# Patient Record
Sex: Male | Born: 1950 | Race: White | Hispanic: No | Marital: Married | Smoking: Never smoker
Health system: Southern US, Community
[De-identification: ages and names within clinical notes are randomized; demographics above are authoritative.]

## PROBLEM LIST (undated history)

## (undated) DIAGNOSIS — R112 Nausea with vomiting, unspecified: Secondary | ICD-10-CM

## (undated) DIAGNOSIS — N529 Male erectile dysfunction, unspecified: Secondary | ICD-10-CM

## (undated) DIAGNOSIS — R0600 Dyspnea, unspecified: Secondary | ICD-10-CM

## (undated) DIAGNOSIS — I2510 Atherosclerotic heart disease of native coronary artery without angina pectoris: Secondary | ICD-10-CM

## (undated) DIAGNOSIS — Z9889 Other specified postprocedural states: Secondary | ICD-10-CM

## (undated) DIAGNOSIS — T4145XA Adverse effect of unspecified anesthetic, initial encounter: Secondary | ICD-10-CM

## (undated) DIAGNOSIS — M1990 Unspecified osteoarthritis, unspecified site: Secondary | ICD-10-CM

## (undated) DIAGNOSIS — E785 Hyperlipidemia, unspecified: Secondary | ICD-10-CM

## (undated) DIAGNOSIS — T8859XA Other complications of anesthesia, initial encounter: Secondary | ICD-10-CM

## (undated) DIAGNOSIS — K219 Gastro-esophageal reflux disease without esophagitis: Secondary | ICD-10-CM

## (undated) DIAGNOSIS — I10 Essential (primary) hypertension: Secondary | ICD-10-CM

## (undated) DIAGNOSIS — C61 Malignant neoplasm of prostate: Secondary | ICD-10-CM

## (undated) DIAGNOSIS — M4802 Spinal stenosis, cervical region: Secondary | ICD-10-CM

## (undated) HISTORY — DX: Essential (primary) hypertension: I10

## (undated) HISTORY — DX: Atherosclerotic heart disease of native coronary artery without angina pectoris: I25.10

## (undated) HISTORY — DX: Malignant neoplasm of prostate: C61

## (undated) HISTORY — PX: CARDIAC CATHETERIZATION: SHX172

## (undated) HISTORY — DX: Male erectile dysfunction, unspecified: N52.9

## (undated) HISTORY — PX: HAND SURGERY: SHX662

## (undated) HISTORY — DX: Hyperlipidemia, unspecified: E78.5

---

## 1983-05-13 HISTORY — PX: APPENDECTOMY: SHX54

## 1999-08-16 ENCOUNTER — Encounter: Admission: RE | Admit: 1999-08-16 | Discharge: 1999-08-16 | Payer: Self-pay | Admitting: Family Medicine

## 1999-08-16 ENCOUNTER — Encounter: Payer: Self-pay | Admitting: Family Medicine

## 2001-03-22 ENCOUNTER — Inpatient Hospital Stay (HOSPITAL_COMMUNITY): Admission: EM | Admit: 2001-03-22 | Discharge: 2001-03-24 | Payer: Self-pay

## 2001-03-22 ENCOUNTER — Encounter: Payer: Self-pay | Admitting: *Deleted

## 2001-03-23 ENCOUNTER — Encounter: Payer: Self-pay | Admitting: Cardiology

## 2001-03-24 ENCOUNTER — Encounter: Payer: Self-pay | Admitting: Gastroenterology

## 2001-07-29 ENCOUNTER — Encounter: Admission: RE | Admit: 2001-07-29 | Discharge: 2001-07-29 | Payer: Self-pay | Admitting: Cardiology

## 2001-07-29 ENCOUNTER — Encounter: Payer: Self-pay | Admitting: Cardiology

## 2002-01-20 ENCOUNTER — Encounter: Payer: Self-pay | Admitting: Family Medicine

## 2002-01-20 ENCOUNTER — Encounter: Admission: RE | Admit: 2002-01-20 | Discharge: 2002-01-20 | Payer: Self-pay | Admitting: Family Medicine

## 2002-04-27 ENCOUNTER — Ambulatory Visit (HOSPITAL_COMMUNITY): Admission: RE | Admit: 2002-04-27 | Discharge: 2002-04-27 | Payer: Self-pay | Admitting: Gastroenterology

## 2002-04-27 ENCOUNTER — Encounter (INDEPENDENT_AMBULATORY_CARE_PROVIDER_SITE_OTHER): Payer: Self-pay | Admitting: *Deleted

## 2002-04-28 ENCOUNTER — Ambulatory Visit (HOSPITAL_COMMUNITY): Admission: RE | Admit: 2002-04-28 | Discharge: 2002-04-28 | Payer: Self-pay | Admitting: Gastroenterology

## 2002-04-28 ENCOUNTER — Encounter: Payer: Self-pay | Admitting: Gastroenterology

## 2002-05-03 ENCOUNTER — Encounter: Payer: Self-pay | Admitting: Gastroenterology

## 2002-05-03 ENCOUNTER — Encounter: Admission: RE | Admit: 2002-05-03 | Discharge: 2002-05-03 | Payer: Self-pay | Admitting: Gastroenterology

## 2002-07-01 ENCOUNTER — Ambulatory Visit (HOSPITAL_COMMUNITY): Admission: RE | Admit: 2002-07-01 | Discharge: 2002-07-01 | Payer: Self-pay | Admitting: General Surgery

## 2005-04-24 ENCOUNTER — Ambulatory Visit (HOSPITAL_COMMUNITY): Admission: RE | Admit: 2005-04-24 | Discharge: 2005-04-24 | Payer: Self-pay | Admitting: Orthopedic Surgery

## 2005-04-24 ENCOUNTER — Ambulatory Visit (HOSPITAL_BASED_OUTPATIENT_CLINIC_OR_DEPARTMENT_OTHER): Admission: RE | Admit: 2005-04-24 | Discharge: 2005-04-24 | Payer: Self-pay | Admitting: Orthopedic Surgery

## 2007-03-13 HISTORY — PX: PROSTATECTOMY: SHX69

## 2007-03-31 ENCOUNTER — Encounter (INDEPENDENT_AMBULATORY_CARE_PROVIDER_SITE_OTHER): Payer: Self-pay | Admitting: Urology

## 2007-03-31 ENCOUNTER — Observation Stay (HOSPITAL_COMMUNITY): Admission: RE | Admit: 2007-03-31 | Discharge: 2007-04-01 | Payer: Self-pay | Admitting: Urology

## 2009-02-02 ENCOUNTER — Encounter: Admission: RE | Admit: 2009-02-02 | Discharge: 2009-02-02 | Payer: Self-pay | Admitting: Occupational Medicine

## 2009-04-11 ENCOUNTER — Inpatient Hospital Stay (HOSPITAL_COMMUNITY): Admission: RE | Admit: 2009-04-11 | Discharge: 2009-04-14 | Payer: Self-pay | Admitting: Orthopedic Surgery

## 2009-04-11 HISTORY — PX: TOTAL KNEE ARTHROPLASTY: SHX125

## 2009-04-11 HISTORY — PX: JOINT REPLACEMENT: SHX530

## 2009-05-07 ENCOUNTER — Encounter: Admission: RE | Admit: 2009-05-07 | Discharge: 2009-05-10 | Payer: Self-pay | Admitting: Orthopedic Surgery

## 2009-05-10 ENCOUNTER — Encounter: Admission: RE | Admit: 2009-05-10 | Discharge: 2009-06-15 | Payer: Self-pay | Admitting: Orthopedic Surgery

## 2010-01-02 ENCOUNTER — Encounter: Admission: RE | Admit: 2010-01-02 | Discharge: 2010-01-02 | Payer: Self-pay | Admitting: Family Medicine

## 2010-01-04 ENCOUNTER — Inpatient Hospital Stay (HOSPITAL_COMMUNITY): Admission: EM | Admit: 2010-01-04 | Discharge: 2010-01-07 | Payer: Self-pay | Admitting: Emergency Medicine

## 2010-07-25 LAB — CARDIAC PANEL(CRET KIN+CKTOT+MB+TROPI)
CK, MB: 1.6 ng/mL (ref 0.3–4.0)
Relative Index: INVALID (ref 0.0–2.5)
Relative Index: INVALID (ref 0.0–2.5)
Total CK: 66 U/L (ref 7–232)
Total CK: 90 U/L (ref 7–232)
Troponin I: 0.01 ng/mL (ref 0.00–0.06)
Troponin I: 0.01 ng/mL (ref 0.00–0.06)

## 2010-07-25 LAB — COMPREHENSIVE METABOLIC PANEL
CO2: 29 mEq/L (ref 19–32)
Calcium: 9 mg/dL (ref 8.4–10.5)
Creatinine, Ser: 1.04 mg/dL (ref 0.4–1.5)
GFR calc non Af Amer: >60 mL/min (ref >60)
Glucose, Bld: 135 mg/dL — ABNORMAL HIGH (ref 70–99)
Total Protein: 5.9 g/dL — ABNORMAL LOW (ref 6.0–8.3)

## 2010-07-25 LAB — CBC
HCT: 40.4 % (ref 39.0–52.0)
HCT: 40.6 % (ref 39.0–52.0)
MCH: 30.2 pg (ref 26.0–34.0)
MCHC: 34.7 g/dL (ref 30.0–36.0)
MCHC: 35.1 g/dL (ref 30.0–36.0)
MCV: 85.8 fL (ref 78.0–100.0)
MCV: 86.4 fL (ref 78.0–100.0)
MCV: 86.8 fL (ref 78.0–100.0)
Platelets: 160 10*3/uL (ref 150–400)
Platelets: 186 10*3/uL (ref 150–400)
Platelets: 197 10*3/uL (ref 150–400)
Platelets: 209 10*3/uL (ref 150–400)
RBC: 4.2 MIL/uL — ABNORMAL LOW (ref 4.22–5.81)
RBC: 4.41 MIL/uL (ref 4.22–5.81)
RBC: 4.73 MIL/uL (ref 4.22–5.81)
RDW: 13.1 % (ref 11.5–15.5)
RDW: 13.2 % (ref 11.5–15.5)
RDW: 13.4 % (ref 11.5–15.5)
WBC: 5.9 10*3/uL (ref 4.0–10.5)
WBC: 6.6 10*3/uL (ref 4.0–10.5)

## 2010-07-25 LAB — HEPATITIS PANEL, ACUTE
HCV Ab: NEGATIVE
Hep A IgM: NEGATIVE
Hepatitis B Surface Ag: NEGATIVE

## 2010-07-25 LAB — POCT CARDIAC MARKERS
CKMB, poc: 1.1 ng/mL (ref 1.0–8.0)
CKMB, poc: 1.9 ng/mL (ref 1.0–8.0)
Myoglobin, poc: 62.5 ng/mL (ref 12–200)
Troponin i, poc: <0.05 ng/mL (ref 0.00–0.09)

## 2010-07-25 LAB — BASIC METABOLIC PANEL
BUN: 10 mg/dL (ref 6–23)
BUN: 18 mg/dL (ref 6–23)
CO2: 29 mEq/L (ref 19–32)
Calcium: 9.2 mg/dL (ref 8.4–10.5)
Chloride: 108 mEq/L (ref 96–112)
Chloride: 99 mEq/L (ref 96–112)
Creatinine, Ser: 1.02 mg/dL (ref 0.4–1.5)
Creatinine, Ser: 1.12 mg/dL (ref 0.4–1.5)
GFR calc Af Amer: >60 mL/min (ref >60)
GFR calc non Af Amer: >60 mL/min (ref >60)
GFR calc non Af Amer: >60 mL/min (ref >60)
GFR calc non Af Amer: >60 mL/min (ref >60)
Glucose, Bld: 104 mg/dL — ABNORMAL HIGH (ref 70–99)
Potassium: 4.4 mEq/L (ref 3.5–5.1)
Potassium: 4.5 mEq/L (ref 3.5–5.1)
Sodium: 136 mEq/L (ref 135–145)
Sodium: 141 mEq/L (ref 135–145)
Sodium: 142 mEq/L (ref 135–145)

## 2010-07-25 LAB — POCT I-STAT, CHEM 8
BUN: 21 mg/dL (ref 6–23)
Chloride: 104 mEq/L (ref 96–112)
Creatinine, Ser: 1.3 mg/dL (ref 0.4–1.5)
Sodium: 143 mEq/L (ref 135–145)
TCO2: 29 mmol/L (ref 0–100)

## 2010-07-25 LAB — CK TOTAL AND CKMB (NOT AT ARMC)
CK, MB: 2.2 ng/mL (ref 0.3–4.0)
Total CK: 95 U/L (ref 7–232)

## 2010-07-25 LAB — MRSA PCR SCREENING

## 2010-07-25 LAB — LIPID PANEL
Cholesterol: 107 mg/dL (ref 0–200)
LDL Cholesterol: 52 mg/dL (ref 0–99)
Total CHOL/HDL Ratio: 3.1 RATIO
VLDL: 20 mg/dL (ref 0–40)

## 2010-07-25 LAB — URINALYSIS, ROUTINE W REFLEX MICROSCOPIC
Bilirubin Urine: NEGATIVE
Hgb urine dipstick: NEGATIVE
Nitrite: NEGATIVE
Protein, ur: NEGATIVE mg/dL
Urobilinogen, UA: 1 mg/dL (ref 0.0–1.0)

## 2010-07-25 LAB — HEPARIN LEVEL (UNFRACTIONATED)

## 2010-07-25 LAB — DIFFERENTIAL
Basophils Absolute: 0 10*3/uL (ref 0.0–0.1)
Eosinophils Relative: 5 % (ref 0–5)
Lymphocytes Relative: 38 % (ref 12–46)
Lymphs Abs: 2.5 10*3/uL (ref 0.7–4.0)
Monocytes Absolute: 0.6 10*3/uL (ref 0.1–1.0)
Neutro Abs: 3.1 10*3/uL (ref 1.7–7.7)

## 2010-07-25 LAB — HEPATIC FUNCTION PANEL
AST: 298 U/L — ABNORMAL HIGH (ref 0–37)
Bilirubin, Direct: 0.2 mg/dL (ref 0.0–0.3)
Indirect Bilirubin: 0.6 mg/dL (ref 0.3–0.9)
Total Bilirubin: 0.8 mg/dL (ref 0.3–1.2)

## 2010-07-25 LAB — LIPASE, BLOOD: Lipase: 46 U/L (ref 11–59)

## 2010-07-25 LAB — HEMOGLOBIN A1C: Mean Plasma Glucose: 120 mg/dL — ABNORMAL HIGH (ref <117)

## 2010-07-25 LAB — PROTIME-INR: INR: 1.02 (ref 0.00–1.49)

## 2010-07-25 LAB — AMYLASE: Amylase: 47 U/L (ref 0–105)

## 2010-08-13 LAB — CBC
HCT: 28.9 % — ABNORMAL LOW (ref 39.0–52.0)
MCHC: 35.4 g/dL (ref 30.0–36.0)
Platelets: 169 10*3/uL (ref 150–400)
Platelets: 192 10*3/uL (ref 150–400)
RBC: 3.79 MIL/uL — ABNORMAL LOW (ref 4.22–5.81)
RDW: 13.2 % (ref 11.5–15.5)
RDW: 13.3 % (ref 11.5–15.5)
WBC: 11.1 10*3/uL — ABNORMAL HIGH (ref 4.0–10.5)

## 2010-08-13 LAB — URINALYSIS, ROUTINE W REFLEX MICROSCOPIC
Leukocytes, UA: NEGATIVE
Protein, ur: 30 mg/dL — AB
Urobilinogen, UA: 1 mg/dL (ref 0.0–1.0)

## 2010-08-13 LAB — PROTIME-INR
INR: 1.17 (ref 0.00–1.49)
INR: 1.37 (ref 0.00–1.49)
Prothrombin Time: 14.8 seconds (ref 11.6–15.2)
Prothrombin Time: 16.8 seconds — ABNORMAL HIGH (ref 11.6–15.2)

## 2010-08-13 LAB — BASIC METABOLIC PANEL
BUN: 11 mg/dL (ref 6–23)
BUN: 12 mg/dL (ref 6–23)
CO2: 29 mEq/L (ref 19–32)
Calcium: 8.6 mg/dL (ref 8.4–10.5)
Creatinine, Ser: 0.89 mg/dL (ref 0.4–1.5)
Creatinine, Ser: 0.96 mg/dL (ref 0.4–1.5)
GFR calc Af Amer: >60 mL/min (ref >60)
GFR calc non Af Amer: >60 mL/min (ref >60)
GFR calc non Af Amer: >60 mL/min (ref >60)
GFR calc non Af Amer: >60 mL/min (ref >60)
Glucose, Bld: 132 mg/dL — ABNORMAL HIGH (ref 70–99)
Glucose, Bld: 181 mg/dL — ABNORMAL HIGH (ref 70–99)
Potassium: 3.5 mEq/L (ref 3.5–5.1)

## 2010-08-13 LAB — URINE MICROSCOPIC-ADD ON

## 2010-08-14 LAB — COMPREHENSIVE METABOLIC PANEL
Albumin: 4.5 g/dL (ref 3.5–5.2)
BUN: 14 mg/dL (ref 6–23)
Calcium: 10.3 mg/dL (ref 8.4–10.5)
Creatinine, Ser: 1.05 mg/dL (ref 0.4–1.5)
Total Protein: 8 g/dL (ref 6.0–8.3)

## 2010-08-14 LAB — URINALYSIS, ROUTINE W REFLEX MICROSCOPIC
Bilirubin Urine: NEGATIVE
Glucose, UA: NEGATIVE mg/dL
Hgb urine dipstick: NEGATIVE
Ketones, ur: NEGATIVE mg/dL
Protein, ur: NEGATIVE mg/dL

## 2010-08-14 LAB — CBC
HCT: 42.6 % (ref 39.0–52.0)
MCHC: 33.3 g/dL (ref 30.0–36.0)
MCV: 90.4 fL (ref 78.0–100.0)
Platelets: 220 10*3/uL (ref 150–400)
RDW: 13.1 % (ref 11.5–15.5)

## 2010-08-14 LAB — APTT: aPTT: 29 seconds (ref 24–37)

## 2010-09-24 NOTE — Op Note (Signed)
NAMESCHON, ZEIDERS NO.:  [id number]   MEDICAL RECORD NO.:  [id number]          PATIENT TYPE:  INP   LOCATION:  1415                         FACILITY:  [facility]   PHYSICIAN:  Valetta Fuller, M.D.  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   POSTOPERATIVE DIAGNOSIS:  Favorable clinical stage T1C adenocarcinoma of  the prostate.   PROCEDURE PERFORMED:  Robotic assisted laparoscopic radical retropubic  prostatectomy.   SURGEON:  Valetta Fuller, M.D.   ASSISTANT:  Dr. Allena Katz.   ANESTHESIA:  General endotracheal.   INDICATIONS:  Charles Oconnor is a relatively healthy 60 year old male who  does have a family history of prostate cancer.  The patient had younger  brother who had been diagnosed with prostate cancer and treated in  [place].  He also had a brother who died of metastatic prostate cancer.  The patient's PSA really was within normal limits but had increased over  the course of time from 2.8-3.8.  An ultrasound was done which showed a  50 grams prostate.  The patient did have a multiple areas with positive  biopsies but this all appeared to be relatively low-volume Gleason 3+3=6  tumor.  The majority of these areas were just some small volumes on  biopsy.  The patient underwent extensive consultation with regard to  treatment options.  He decided to have a surgical approach.  Again he  appeared to understand the potential complications as well as some of  the advantages and disadvantages of surgery for treatment of prostate  cancer in this setting.   TECHNIQUE AND FINDINGS:  The patient did undergo preoperative teaching  and physical therapy consultation.  He received preoperative Unasyn and  also placement of compression boots.  He was brought to the operating  room where he had  successful induction of endotracheal anesthesia.  The  patient was placed in the midlithotomy position and prepped and draped  in the usual manner.  He had been secured to the table.  All extremities  were carefully padded.  He was then placed in steep Trendelenburg  position.  Foley catheter was placed sterilely on the field.  The  initial camera port incision site was chosen to the left the umbilicus  18 cm above the pubic symphysis.  A standard open Hasson technique was  utilized.  A 12 mm trocar was placed after careful palpation revealed no  obvious adhesions.  Abdominal insufflation occurred without difficulty  or complication.  All other trocars were placed with direct visual  guidance.  This included a 12 and 5 mm assist port and three 8 mm  robotic ports.  After all the ports were placed, the robot was docked.  The bladder was filled through the Foley catheter.  The space of Retzius  was entered by dropping the bladder posteriorly.  Hot electrocautery  scissors were used to open the space of Retzius.  Some fat over the  bladder neck and endopelvic fascia of the prostate was taken down and  the superficial vein cauterized with the monopolar TK dissector.  The  endopelvic fascia was then incised from apex to base.  The levator  musculature was swept off the apex of the prostate allowing [place] to  identify and isolate the dorsal venous complex.  The ETS stapling device  was used to staple across the venous complex after taking down the  puboprostatic ligaments bilaterally.  Hemostasis was quite good.  With  the Foley balloon we were able to identify the bladder neck.  The  anterior bladder neck was transected with electrocautery.  Foley  catheter was then found in the midline and retracted anteriorly.  There  was no evidence of a middle lobe.  Posterior bladder neck was then  transected and then posteriorly the individual vas deferens and seminal  vesicles were isolated and individually  dissected.  Clips were used for  the tip of the seminal vesicles.  These structures were then retracted  anteriorly.  We were then able to establish a nice plane between  Denonvilliers fascia on the surface of the prostate and the rectum.  There was no evidence that the prostate was in anyway stuck.   Attention was then turned towards bilateral nerve spare.  On the mid  lateral aspect of the prostate.  Superficial fascia was incised on both  sides.  It was then swept down to the posterior lateral aspect of the  prostate where nerve bundles were dissected free off the prostate from  the apex again to the base.  Once the neurovascular bundles were swept  off the posterior lateral aspect of the prostate on both sides, we were  able to lift the prostate and isolate the pedicles to the prostate which  were taken down with clips.  Once this was accomplished, we focused our  attention on the extreme apex of the prostate.  There was a little  remaining dorsal vein complex which was cauterized.  The urethra was  then sharply incised anteriorly.  The Foley catheter was retracted and  the entire ureter was transected.  A very nice urethral stump remained.  We went ahead and removed the prostate specimen.  There appeared to be  good neurovascular bundles on both sides.  The pelvis was then  irrigated.  We saw no real indication for pelvic lymph node dissection.  O reapproximated some posteriorly Denonvilliers fascia to get the  bladder some additional support utilizing a 2-0 Vicryl suture.  The  posterior urethra and bladder neck were then reapproximated using  another 2-0 Vicryl suture.  The bladder neck did not require any  closure.  Once the posterior aspects were reapproximated, we used a 3-0  double-armed Monocryl suture to perform a 360 degrees running  anastomosis.  A new coude catheter was placed through the anastomosis  without difficulty.  Irrigation revealed this to be watertight.   Through  one of the robotic trocars, a drain was placed and placed in the  retropubic space overlying the bladder and anastomosis.  This was  secured to the skin.  A 12-mm assist port was closed with 0 Vicryl with  the aid of a suture passer.  All other ports were carefully inspected  upon their removal.  An Endopouch was used to obtain the prostatic  specimen.  The camera port incision was extended slightly in order to  remove the specimen.  That incision was closed with a running 0 Vicryl  suture.  Other 8 mm and 5 mm sites were infiltrated with Marcaine and  then closed with clips.  The patient appeared to tolerate the procedure  well.  There were no obvious complications or problems and he was  brought to recovery room in stable condition.           ______________________________  Valetta Fuller, M.D.  Electronically Signed     DSG/MEDQ  D:  04/01/2007  T:  04/01/2007  Job:  [id number]

## 2010-09-27 NOTE — Op Note (Signed)
NAMEJUDE, NACLERIO                         ACCOUNT NO.:  [id number]   MEDICAL RECORD NO.:  [id number]                   PATIENT TYPE:  AMB   LOCATION:  DAY                                  FACILITY:  [facility]   PHYSICIAN:  Adolph Pollack, M.D.            DATE OF BIRTH:  1950/06/29   DATE OF PROCEDURE:  07/01/2002  DATE OF DISCHARGE:                                 OPERATIVE REPORT   PREOPERATIVE DIAGNOSIS:  Chronic right lower quadrant abdominal pain.   POSTOPERATIVE DIAGNOSES:  1. Chronic right lower quadrant abdominal pain.  2. Right lower quadrant adhesions.   PROCEDURE:  Diagnostic laparoscopy with lysis of adhesions.   SURGEON:  Adolph Pollack, M.D.   ANESTHESIA:  General.   INDICATION:  Mr. Gutridge is a 60 year old male, who in the past six months  has had intermittent acute right lower quadrant type.  It sometimes occurs  after a long sitting or bending.  Some nausea occurs with it.  No weight  loss.  No fever or chills.  No dysuria.  He has had an extensive evaluation,  including CT scan, colonoscopy, and small-bowel follow-through with no  obvious pathology noted.  I was asked to see him for consideratio diagnostic  laparoscopy and after discussion of the procedure and the risks, he wanted  to proceed.   TECHNIQUE:  He was seen in the holding area, brought to the operating room,  placed supine on the operating table, and a general anesthetic was  administered.  The abdomen was sterilely prepped and draped.  A previous  appendectomy incision was noted in the right lower quadrant.  Local  anesthetic consisting of 0.5% plain Marcaine was infiltrated in the  subumbilical region and a small subumbilical incision made through the skin,  subcutaneous tissue, and midline fascia.  The peritoneal cavity was entered  sharply and under direct vision.  A pursestring suture of 0 Vicryl was  placed around the fascial edges.  A Hasson trocar was introduced to the  peritoneal cavity, and pneumoperitoneum was created by insufflation of CO2  gas.   Next, a laparoscope was introduced.  Examining in the right lower quadrant  area, I noticed adhesions including the omentum and the cecum and the right  lower quadrant abdominal wall underneath the appendectomy incision.  I  examined the cecum and the proximal right colon and did not notice any  external abnormalities.  I examined the ileocecal valve and proximally the  distal 60 cm of ileum, and no abnormalities were noted.  There was a small  left inguinal hernia noted.  Liver surface appeared normal.   I placed a 5 mm trocar in the left lower quadrant through a left lower  quadrant incision and using sharp dissection, performed adhesiolysis,  allowing the cecum and omentum to fall back into the abdominal cavity.  I  then examined the underside of the appendectomy incision and noticed some  attenuation in the posterior muscular wall, but the anterior muscular wall  appeared to be somewhat intact.  Thus, there was some weakness deep to the  appendectomy incision but no obvious hernia that I could detect at this  time.   Following this, I inspected the area, and no bleeding was noted.  I spoke  with his wife intraoperatively, telling her of the intraoperative findings  and told her I felt that we should stop the procedure at this time and see  how he did clinically, and she was agreeable with this.  He was  hemodynamically stable throughout the entire procedure.   I subsequently removed the 5 mm left lower quadrant trocar and released the  pneumoperitoneum and removed the Hasson trocar.  The subumbilical fascial  defect was closed by tightening up and tying down the pursestring suture.  I  then injected some local anesthetic into the fascia in the subumbilical  region.  The skin incisions were closed with 4-0 Monocryl cuticular  stitches.  Steri-Strips and sterile dressings were applied.   He tolerated  the procedure well without any apparent complications and was  taken to the recovery room in satisfactory condition.  A summary of the  findings include the adhesions between the cecum and the omentum and the  right lower quadrant deep to the appendectomy incision.  A small left  inguinal hernia.  Some attenuation of the abdominal wall just deep to the  appendectomy incision but no obvious hernia that I could appreciate.                                               Adolph Pollack, M.D.    Kari Baars  D:  07/01/2002  T:  07/01/2002  Job:  [id number]   cc:   Anselmo Rod, M.D.  [address]  [address]  [place]  [place] [address]  Fax: [phone]   Donia Guiles, M.D.  [address] [place]  [place] [address]  Fax: [phone]

## 2010-09-27 NOTE — Cardiovascular Report (Signed)
[place]  Patient:    Charles Oconnor, Charles Oconnor Visit Number: [id number] MRN: [id number]          Service Type: MED Location: 3700 [id number] Attending Physician:  Ophelia Shoulder Dictated by:   Madaline Savage, M.D. Proc. Date: 03/22/01 Admit Date:  03/22/2001   CC:         Desma Maxim, M.D.  Cardiac Catheterization Lab   Cardiac Catheterization  PROCEDURES PERFORMED: 1. Selective coronary angiography by Judkins technique. 2. Retrograde left heart catheterization. 3. Left ventricular angiography.  CARDIOLOGIST:  Madaline Savage, M.D.  COMPLICATIONS:  None.  ENTRY SITE:  Right femoral.  DYE USED:  Omnipaque.  PATIENT PROFILE:  The patient is a 60 year old white married man who has been having low sternal chest pain over an area as large as the palm of his hand since last Thursday which would be about four days ago.  It is not constant. It waxes and wanes.  It has gotten some relief with IV nitroglycerin in the emergency room today.  EKGs have been normal.  Cardiac enzymes have been negative.  The patient has a family history of coronary disease.  He has treated hypertension and treated hyperlipidemia.  He was brought to the catheterization lab electively today to further evaluate his coronary arteries in view of his family history and his risk factors.  RESULTS:  PRESSURES:  The left ventricular pressure was 148/17, central aortic pressure was 148/82.  No aortic valve gradient by pullback technique.  ANGIOGRAPHIC RESULTS:  The left main coronary artery was normal.  The LAD contains some luminal irregularities just after the first diagonal branch and after the second septal perforator branch.  These were considered luminal irregularities but no discrete lesions.  The first diagonal branch is a fairly large vessel with three or four distal branches.  No lesions were seen.  The circumflex coronary artery gave rise to  a long small caliber intermediate ramus branch which was normal.  The circumflex contained some luminal irregularities proximally, but no significant lesions were seen.  It terminates as an obtuse marginal branch distally.  The right coronary artery was large and dominant.  There is an area in the proximal portion of the vessel that is 30% narrowed near the takeoff of the pulmonary branch.  The distal vessel is smooth and free of any significant lesions.  This 30% area of narrowing is seen also in the RAO projection as well.  The left ventricle is normal.  Contractility shows no wall motion abnormalities.  I estimate ejection fraction at 55%.  There is no mitral regurgitation.  FINAL DIAGNOSES: 1. Angiographically patent coronary arteries.  (There is a 30% area of smooth    irregularity in the proximal right coronary artery.) 2. Normal left ventricular systolic function.  RECOMMENDATIONS:  GI evaluation.  Continue to work on risk factor modification and optimizing antihypertensive therapy and antilipid therapy. Dictated by:   Madaline Savage, M.D. Attending Physician:  Ophelia Shoulder DD:  03/22/01 TD:  03/23/01 Job: [id number]

## 2010-09-27 NOTE — Consult Note (Signed)
. [facility]  Patient:    DA, AUTHEMENT Visit Number: [id number] MRN: [id number]          Service Type: MED Location: 3700 3715 [id number] Attending Physician:  Ophelia Shoulder Dictated by:   Anselmo Rod, M.D. Proc. Date: 03/22/01 Admit Date:  03/22/2001   CC:         Madaline Savage, M.D.  Desma Maxim, M.D.   Consultation Report  DATE OF BIRTH:  February 06, 1951  REASON FOR CONSULTATION:  Chest pain.  ASSESSMENT: 1. Chest pain with no critical lesion seen on cardiac catheterization.  Rule    out peptic ulcer disease. 2. Nausea.  Rule out gallbladder pathology. 3. Hyperlipidemia on Zocor. 4. Abnormal weight loss of 25 pounds in the last four months. 5. History of hypertension. 6. History of depression and situational anxiety. 7. Family history of premature heart disease.  His brother died of    an myocardial infarction at age 40.  RECOMMENDATIONS: 1. EGD today.  Further recommendations after EGD has been done. 2. Gallbladder ultrasound.  Rule out gallbladder pathology. 3. Decrease aspirin to 81 mg per day. 4. Continue PPI for now.  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  The patient underwent emergent cardiac catheterization that revealed no critical lesions. The patient has been under a lot of emotional stress lately because of his daughter who attempted suicide on three occasions and has had problem with drug and alcohol abuse.  The patient says he lost 25 pounds in the last four months because of no desire to eat.  He is under considerable stress and started Zoloft two weeks ago which he feels has not started helping yet.  He realizes it might be premature to expect results at this time.  He has had chest pain which became severe yesterday with some nausea but without vomiting.  There was no shortness of breath or  diaphoresis.  The patient denies history of ulcers, jaundice, or colitis.  He has a history of hypertension but no other cardiorespiratory complaints.  He denies any genitourinary problems.  The patient had a cardiac catheterization 10 years ago that was normal.  He has had chest discomfort since Thursday of last week. His symptoms got worse yesterday, prompting him to come to the emergency room. There are no musculoskeletal, vascular, or allergic problems.  He denies any headaches, dizziness, or syncope.  The patient has had irregular bowel movements since about two months ago and started experiencing constipation. He has one bowel movement every day but has noticed some hard stools.  He denies any blood or mucus in stool.  ALLERGIES:  No known drug allergies.  MEDICATIONS:  1. In the hospital, he has been on heparin.  2. He is also on aspirin 325 mg per day.  3. Zestril 20 mg per day.  4. HCTZ 12.5 mg per day.  5. Zocor 10 mg per day.  6. Zoloft 50 mg per day.  7. Colace 200 mg p.r.n.  8. Verapamil 240 mg b.i.d.  9. Protonix 40 mg per day. 10. Alprazolam 0.25 mg b.i.d. 11. Restoril 15 mg q.h.s. 12. Nitroglycerin drip which has now been discontinued. 13. Phenergan p.r.n. 14. He also received morphine sulfate for chest pain.  PAST MEDICAL HISTORY:  See list above.  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  He lives [place] his [place] and [place] He has one daughter, 41 years of age, who is a troubled child with problems associated with drug and alcohol use.  The patient denies personal history of drug, alcohol, or tobacco use.  FAMILY HISTORY:  The patients father died of complications associated with AAA at the age of 3.  His mother died of a stroke at 31.  Brother died of an MI at 37.  Another brother died of prostate cancer at 31.  He has a brother who survived prostate cancer.  He has three sisters who  are alive and well. He has no family history of colon cancer.  REVIEW OF SYSTEMS:  Chest pain, anxiety, depression, nausea with no vomiting. No history of bright red blood per rectum or melena.  PHYSICAL EXAMINATION:  GENERAL:  A very pleasant, middle-age, white male in no acute distress, lying comfortably in bed, slightly nauseous.  VITAL SIGNS:  Temperature 97.9, blood pressure 150/90, pulse 80 per minute, respiratory rate 20.  HEENT:  Normocephalic, atraumatic.  PERRLA.  Face symmetric and well preserved.  Oropharyngeal mucosa without exudate.  NECK:  No JVD, thyromegaly, or lymphadenopathy.  CHEST:  Clear to auscultation.  S1, S2 regular.  ABDOMEN:  Soft with minimal epigastric tenderness on palpation with guarding. No rebound or rigidity.  No hepatosplenomegaly, no masses palpable.  RECTAL:   Guaiac negative with no other masses palpable on this examination.  ADMISSION LABORATORY DATA:  Hemoglobin 14.1, hematocrit 40.5, MCV 84.3, white count 6.5, 68% neutrophils.  Sodium 139, potassium 3.8, chloride 106, CO2 28, glucose 101, BUN 14, creatinine 1, calcium 10.  PT 13.3, INR 1, PTT 28.  TSH was done and was normal at 0.891.  CK-MBs were normal.  Liver function tests were normal as well.  IMPRESSION:  The patient had a cardiac catheterization on admission that revealed no critical lesions as mentioned above.   Therefore, considering this, other recommendations have been made as mentioned above.   I will continue to follow the patient in the hospital ______ on outpatient basis thereafter.  Thank you for this consultation. Dictated by:   Anselmo Rod, M.D. Attending Physician:  Ophelia Shoulder DD:  03/23/01 TD:  03/23/01 Job: [id number]

## 2010-09-27 NOTE — Op Note (Signed)
NAMETUAN, TIPPIN               ACCOUNT NO.:  [id number]   MEDICAL RECORD NO.:  [id number]          PATIENT TYPE:  AMB   LOCATION:  DSC                          FACILITY:  [facility]   PHYSICIAN:  Nadara Mustard, MD     DATE OF BIRTH:  December 25, 1950   DATE OF PROCEDURE:  DATE OF DISCHARGE:                                 OPERATIVE REPORT   Audio too short to transcribe (less than 5 seconds)      Nadara Mustard, MD     MVD/MEDQ  D:  04/24/2005  T:  04/24/2005  Job:  [id number]

## 2010-09-27 NOTE — Op Note (Signed)
Charles Oconnor, Charles Oconnor               ACCOUNT NO.:  [id number]   MEDICAL RECORD NO.:  [id number]          PATIENT TYPE:  AMB   LOCATION:  DSC                          FACILITY:  [facility]   PHYSICIAN:  Nadara Mustard, MD     DATE OF BIRTH:  Jul 23, 1950   DATE OF PROCEDURE:  04/24/2005  DATE OF DISCHARGE:                                 OPERATIVE REPORT   PREOPERATIVE DIAGNOSIS:  Medial meniscal tear, right knee.   POSTOPERATIVE DIAGNOSES:  1.  Medial lateral meniscal tears, right knee.  2.  Osteochondral defect, medial femoral condyle.   PROCEDURE:  1.  Abrasion chondroplasty, medial femoral condyle and medial tibial      plateau.  2.  Partial medial and lateral meniscectomies.   SURGEON:  Nadara Mustard, MD   ANESTHESIA:  Knee block plus LMA.   ESTIMATED BLOOD LOSS:  Minimal.   ANTIBIOTICS:  None.   DRAINS:  None.   COMPLICATIONS:  None.   TOURNIQUET TIME:  None.   DISPOSITION:  To PACU in stable condition.   INDICATION FOR PROCEDURE:  The patient is a 60 year old gentleman with  mechanical catching and locking symptoms of his right knee.  The patient has  failed conservative care and presents at this time for arthroscopic  intervention.  The risks and benefits were discussed including infection,  neurovascular injury, persistent pain, and need for additional surgery.  The  patient states he understands and wishes to proceed at this time.   DESCRIPTION OF PROCEDURE:  The patient underwent a knee block and was then  brought to OR room 6.  The patient still had some feeling in the knee and he  underwent an LMA anesthetic.  After adequate level of anesthesia was  obtained, the patient's right lower extremity was prepped using DuraPrep and  draped into a sterile field.  The scope was inserted through the  inferolateral portal and an inferomedial working portal was established.  Visualization showed a large osteochondral defect of the medial femoral  condyle with essentially  eburnated bone.  The ring curette was used to  debride this back to good viable subchondral bone.  The patient had a large  medial meniscal tear; this was also debrided.  There was also grade 3 to  grade 4 changes of the medial tibial plateau and this was also debrided.  Visualization of the notch showed an intact ACL.  Visualization of the  lateral joint line in the figure-of-four position also showed a large  degenerative tear of the lateral meniscus.  This was debrided with the  shaver.  There was no articular cartilage defects in the lateral femoral  condyle, but there were some grade 3 changes of the lateral tibial plateau.  Visualization of the notch showed some grade 3 changes of the patella and  trochlea.  There was a large plica band extending from medially to laterally  across the inferior aspect of  the patella.  This was debrided.  The  suprapatellar pouch was cleansed.  The medial and lateral joint lines were  cleansed of any loose bodies.  A  survey was then again performed of all 3  compartments.  The instruments were removed.  The portals were closed using  3-0 nylon and the wound was injected with a total 20 mL of 0.5% Marcaine  plain and 4 mg of morphine.  The wound was covered with Adaptic, orthopedic  sponges, sterile Webril and a Coban dressing.  The patient was extubated and  taken to the PACU in stable condition, and plan to follow up the office in 2  weeks.      Nadara Mustard, MD  Electronically Signed     MVD/MEDQ  D:  04/24/2005  T:  04/28/2005  Job:  [id number]

## 2010-09-27 NOTE — Op Note (Signed)
NAME:  Charles Oconnor, Charles Oconnor                         ACCOUNT NO.:  [id number]   MEDICAL RECORD NO.:  [id number]                   PATIENT TYPE:  AMB   LOCATION:  ENDO                                 FACILITY:  [facility]   PHYSICIAN:  Anselmo Rod, M.D.               DATE OF BIRTH:  1951/01/09   DATE OF PROCEDURE:  04/27/2002  DATE OF DISCHARGE:                                 OPERATIVE REPORT   PROCEDURE PERFORMED:  Colonoscopy with endoscopies.   ENDOSCOPIST:  Charna Elizabeth, M.D.   INSTRUMENT USED:  Olympus video colonoscope.   INDICATIONS FOR PROCEDURE:  The patient is a 60 year old white male with a  history of rectal bleeding and right lower quadrant pain.  The right lower  quadrant pain has been ongoing issue for several years.  The patient had an  appendectomy 10 years ago.  Rule out colonic polyps, masses, etc.   PREPROCEDURE PREPARATION:  Informed consent was procured from the patient.  The patient was fasted for eight hours prior to the procedure and prepped  with a bottle of magnesium citrate and a gallon of NuLytely the night prior  to the procedure.   PREPROCEDURE PHYSICAL:  The patient had stable vital signs.  Neck supple.  Chest clear to auscultation.  S1 and S2 regular.  Abdomen soft with normal  bowel sounds.  A well healed surgical scar from previous appendectomy.   DESCRIPTION OF PROCEDURE:  The patient was placed in left lateral decubitus  position and sedated with 70 mg of Demerol and 7 mg of Versed intravenously.  Once the patient was adequately sedated and maintained on low flow oxygen  and continuous cardiac monitoring, the Olympus video colonoscope was  advanced from the rectum to the cecum without difficulty.  The patient had  evidence of left-sided diverticulosis, extrinsic compression of the cecum  was noted.  This could be from a mass seen in the distal terminal ileum  which was a soft fleshy mass and was biopsied for pathology.  It did not  seem ulcerated  and the small bowel beyond this area could not be visualized.  The patient had no other masses or polyps seen in the colon.   IMPRESSION:  1. Soft mass in terminal ileum biopsies for pathology.  2. Left-sided diverticulosis.  3. Extrinsic compression of the cecum, question small bowel mass.   RECOMMENDATIONS:  1. Avoid all nonsteroidals including aspirin for the next three to four     weeks.  2. Await pathology results.  3. Repeat CT scan of the abdomen and pelvis after discussing the patient's     CT scan results over the phone with Dr. Stevphen Meuse.  The patient had a CT     scan done in September of 2003 that revealed no acute abnormalities,     inflammatory change in the terminal ileum but this was done without     contrast and  therefore plans are being made to do repeat CT scan with     oral and IV contrast, basic labs including a CBC and a CMET will be     checked today as well.  4. Possible surgical evaluation with Dr. Avel Peace.  The patient has     been discussed with him over the phone.  All the records will be     forwarded to Dr. Abbey Chatters prior to the patient's appointment for Dr.     Maris Berger review.                                                   Anselmo Rod, M.D.    JNM/MEDQ  D:  04/27/2002  T:  04/27/2002  Job:  [id number]   cc:   Donia Guiles, M.D.  [address] [place]  [place] [address]  Fax: [phone]   Adolph Pollack, M.D.  [address], [address]  [place]  [place] [address]  Fax: [phone]

## 2010-09-27 NOTE — Procedures (Signed)
[facility]  Patient:    Charles Oconnor, Charles Oconnor Visit Number: [id number] MRN: [id number]          Service Type: MED Location: [id number] Attending Physician:  Ophelia Shoulder Dictated by:   Anselmo Rod, M.D. Proc. Date: 03/23/01 Admit Date:  03/22/2001   CC:         Madaline Savage, M.D.   Procedure Report  DATE OF BIRTH:  28-May-1950  REFERRING PHYSICIAN:  Madaline Savage, M.D.  PROCEDURE PERFORMED:  Esophagogastroduodenoscopy.  ENDOSCOPIST:  Anselmo Rod, M.D.  INSTRUMENT USED:  Olympus video panendoscope.  INDICATIONS FOR PROCEDURE:  Chest pain and epigastric pain and mild nausea in a 60 year old white male with a normal cardiac catheterization.  Rule out peptic ulcer disease.  PREPROCEDURE PREPARATION:  Informed consent was procured from the patient. The patient was fasted for eight hours prior to the procedure.  PREPROCEDURE PHYSICAL:  The patient had stable vital signs.  Neck supple. Chest clear to auscultation.  S1, S2 regular.  Abdomen soft with minimal epigastric tenderness on palpation with guarding, no rebound, no rigidity, no hepatosplenomegaly.  DESCRIPTION OF PROCEDURE:  The patient was placed in left lateral decubitus position and sedated with 50 mg of Demerol and 7.5 mg of Versed intravenously.  Once the patient was adequately sedated and maintained on low-flow oxygen and continuous cardiac monitoring, the Olympus video panendoscope was advanced through the mouthpiece, over the tongue, into the esophagus under direct vision.  The entire esophagus appeared normal without evidence of ring, stricture, masses, lesions, esophagitis, Barretts mucosa.  The scope was then advanced into the stomach.  There was some mild antral gastritis noted.  There was no evidence of a hiatal hernia.  Mild duodenitis was noted in the duodenum bulb.  The small bowel distal to the bulb appeared normal.  There was no outlet  obstruction.  The patient tolerated the procedure well without complications.  IMPRESSION: 1. Normal-appearing esophagus. 2. Mild antral gastritis. 3. Mild duodenitis in duodenal bulb.  RECOMMENDATION: 1. Continue PPIs. 2. Decrease aspirin to 81 mg per day. 3. Continue Zoloft and Xanax. 4. Abdominal ultrasound rule out gallbladder pathology. Dictated by:   Anselmo Rod, M.D. Attending Physician:  Ophelia Shoulder DD:  03/23/01 TD:  03/23/01 Job: [id number]

## 2010-09-27 NOTE — Discharge Summary (Signed)
[facility]  Patient:    Charles Oconnor, Charles Oconnor Visit Number: [id number] MRN: [id number]          Service Type: MED Location: 3700 [id number] Attending Physician:  Ophelia Shoulder Dictated by:   Abelino Derrick, P.A.-C. Admit Date:  03/22/2001 Disc. Date: 03/24/01   CC:         Desma Maxim, M.D.  Anselmo Rod, M.D.   Discharge Summary  DISCHARGE DIAGNOSES 1. Chest pain, probably noncardiac in origin with trivial irregularities    noted at catheterization this admission. 2. Preserved left ventricular function. 3. Mild gastritis and duodenitis by endoscopy this admission. 4. Hypertension. 5. Hyperlipidemia. 6. History of depression and anxiety.  HISTORY OF PRESENT ILLNESS:  The patient is a 60 year old male, followed by Dr. Madaline Savage and Dr. Desma Maxim, with a history of chest pain. Previous catheterization has been normal.  He had been in his usual state of health when he developed substernal chest pain on March 18, 2001.  This was suspicious for unstable angina.  HOSPITAL COURSE:  He was seen and admitted.  He was actually on a cruise at the time he had this, and he came up from [place]  He was admitted to telemetry and started on heparin and nitroglycerin and Protonix. He was set up for a diagnostic catheterization which was done on March 22, 2001, late in the afternoon.  This revealed no significant lesions, and scattered trivial irregularities with normal LV function.  The patient was seen in consultation by Dr. Anselmo Rod for a GI consultation.  This was on March 23, 2001. Endoscopy was done later on March 23, 2001, which revealed a normal esophagus, mild antral gastritis, and mild duodenitis.  The recommendations were to continue the proton pump inhibitor and cut back on his aspirin dose. The patient did have a gallbladder ultrasound done on March 24, 2001.  If this is negative, he  will be discharged later on November 13th.  We did stop his Verapamil and change him to Norvasc.  He had significant bradycardia, with heart rates into the low 40s.  We feel that he is stable for discharge if his gallbladder ultrasound is negative late on March 24, 2001.  DISCHARGE MEDICATIONS 1. Norvasc 10 mg q.d. 2. Zocor 10 mg q.d. 3. Lisinopril hydrochlorothiazide 20/12.5 mg once q.d. 4. Zoloft 50 mg q.d. 5. Xanax 0.25 mg b.i.d. p.r.n. 6. Aspirin 81 mg q.d. 7. Protonix 40 mg q.d.  LABORATORY DATA:  Renal function shows a sodium of 146, potassium 3.9, BUN 13, creatinine 1.2.  White count 7.5, hemoglobin 12.9, hematocrit 37.4, platelets 216.  TSH 0.89.  INR 1.0.  CPK is negative.  Troponin negative x 1.  Chest x-ray showed no active disease.  He did have a chest CT this admission which revealed no evidence of aortic dissection.  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  A follow-up limited CT without contrast is recommended in six months.  DISPOSITION:  The patient is discharged in stable condition.  FOLLOWUP:  He will follow up in our office on April 09, 2001, at noon.  He will see Dr. Elsie Lincoln next month.  He has been instructed to contact Dr. Arvilla Market for further followup.  As noted above, we will need to make sure that he gets a follow-up limited CT in six months. Dictated by:   Abelino Derrick, P.A.-C.  Attending Physician:  Ophelia Shoulder DD:  03/24/01 TD:  03/24/01 Job: [id number]

## 2011-02-18 LAB — BASIC METABOLIC PANEL
CO2: 29
Calcium: 9.5
GFR calc Af Amer: >60
GFR calc non Af Amer: >60
Sodium: 138

## 2011-02-18 LAB — HEMOGLOBIN AND HEMATOCRIT, BLOOD
HCT: 40.6
Hemoglobin: 11.5 — ABNORMAL LOW
Hemoglobin: 14.4

## 2011-02-18 LAB — TYPE AND SCREEN: ABO/RH(D): O POS

## 2011-02-18 LAB — ABO/RH: ABO/RH(D): O POS

## 2013-01-12 ENCOUNTER — Encounter: Payer: Self-pay | Admitting: *Deleted

## 2013-01-12 ENCOUNTER — Encounter: Payer: Self-pay | Admitting: Internal Medicine

## 2013-01-13 ENCOUNTER — Ambulatory Visit (INDEPENDENT_AMBULATORY_CARE_PROVIDER_SITE_OTHER): Payer: 59 | Admitting: Internal Medicine

## 2013-01-13 ENCOUNTER — Encounter: Payer: Self-pay | Admitting: Internal Medicine

## 2013-01-13 VITALS — BP 142/84 | HR 54 | Ht 74.0 in | Wt 202.9 lb

## 2013-01-13 DIAGNOSIS — I2510 Atherosclerotic heart disease of native coronary artery without angina pectoris: Secondary | ICD-10-CM

## 2013-01-13 DIAGNOSIS — I10 Essential (primary) hypertension: Secondary | ICD-10-CM

## 2013-01-13 DIAGNOSIS — E785 Hyperlipidemia, unspecified: Secondary | ICD-10-CM | POA: Insufficient documentation

## 2013-01-13 NOTE — Progress Notes (Signed)
OFFICE NOTE  Chief Complaint:  Routine office visit  Primary Care Physician: Lupita Raider, MD  HPI:  Charles Oconnor  is a 62 year old gentleman with history of hypertension, dyslipidemia and mild to moderate coronary disease. We have been following him for dyslipidemia and he has had liver enzyme abnormalities before on Zocor and was changed to Pravachol with pretty good control. Sometimes he gets chest discomfort; however, it is pretty short lived and possibly related to that. His EKG today shows a sinus bradycardia at 54 and I understand recently he was placed on a beta blocker which caused marked sinus bradycardia and that was promptly discontinued. At this point, although there is possibly an occasion for that given his history of coronary disease, his heart rate being low, he is unable to tolerate a beta blocker. He is on an ACE inhibitor which is pretty good at controlling his blood pressure as well as amlodipine.   PMHx:  Past Medical History  Diagnosis Date  . Hypertension   . Dyslipidemia   . Coronary artery disease     mild to moderate  . Prostate cancer   . ED (erectile dysfunction)     Past Surgical History  Procedure Laterality Date  . Cardiac catheterization  2002, 2011    mild-mod CAD  . Appendectomy  1985  . Prostatectomy  03/2007  . Total knee arthroplasty  04/2009    FAMHx:  Family History  Problem Relation Age of Onset  . Heart attack Father   . Diabetes Mother   . Heart failure Mother   . Coronary artery disease Mother   . Stroke Mother   . Prostate cancer Brother   . Heart attack Brother     also CVA  . Heart attack Brother     also CVA  . Cancer Brother     SOCHx:   reports that he has never smoked. He has never used smokeless tobacco. His alcohol and drug histories are not on file.  ALLERGIES:  Allergies  Allergen Reactions  . Zocor [Simvastatin]     Elevate liver enzymes    ROS: A comprehensive review of systems was negative  except for: Constitutional: positive for weight gain  HOME MEDS: Current Outpatient Prescriptions  Medication Sig Dispense Refill  . amLODipine (NORVASC) 10 MG tablet Take 10 mg by mouth daily.      . Ascorbic Acid (VITAMIN C PO) Take by mouth daily.      Marland Kitchen aspirin 81 MG tablet Take 81 mg by mouth daily.      . fish oil-omega-3 fatty acids 1000 MG capsule Take 3 g by mouth daily.      Marland Kitchen lisinopril-hydrochlorothiazide (PRINZIDE,ZESTORETIC) 20-12.5 MG per tablet Take 1 tablet by mouth daily.      . Multiple Vitamins-Minerals (MULTIVITAMIN PO) Take by mouth daily.      . Naproxen Sodium (ALEVE PO) Take 2 tablets by mouth daily.      . nitroGLYCERIN (NITROSTAT) 0.4 MG SL tablet Place 0.4 mg under the tongue every 5 (five) minutes as needed for chest pain.      . pantoprazole (PROTONIX) 40 MG tablet Take 40 mg by mouth daily.      . pravastatin (PRAVACHOL) 40 MG tablet Take 40 mg by mouth daily.       No current facility-administered medications for this visit.    LABS/IMAGING: No results found for this or any previous visit (from the past 48 hour(s)). No results found.  VITALS: BP 142/84  Pulse 54  Ht 6\' 2"  (1.88 m)  Wt 202 lb 14.4 oz (92.035 kg)  BMI 26.04 kg/m2  EXAM: General appearance: alert and no distress Neck: no adenopathy, no carotid bruit, no JVD, supple, symmetrical, trachea midline and thyroid not enlarged, symmetric, no tenderness/mass/nodules Lungs: clear to auscultation bilaterally Heart: regular rate and rhythm, S1, S2 normal, no murmur, click, rub or gallop Abdomen: soft, non-tender; bowel sounds normal; no masses,  no organomegaly Extremities: extremities normal, atraumatic, no cyanosis or edema Pulses: 2+ and symmetric Skin: Skin color, texture, turgor normal. No rashes or lesions Neurologic: Grossly normal  EKG: Sinus bradycardia 54  ASSESSMENT: 1. Hypertension-medical 2. Dyslipidemia 3. Mild to moderate coronary disease - medically managed  PLAN: 1.    Mr. Cravens is doing fairly well although he said a small amount of weight gain. I have recommended increased exercise. He reports that his diet is not as good as he is traveling very frequently. Again I've encouraged him to continue to do exercise and will plan to see him back annually. No changes on his medications today.  Chrystie Nose, MD, [facility] Attending Cardiologist [facility]  HILTY,Kenneth C 01/13/2013, 9:21 AM

## 2013-01-13 NOTE — Patient Instructions (Addendum)
Your physician wants you to follow-up in: 1 year. You will receive a reminder letter in the mail two months in advance. If you don't receive a letter, please call our office to schedule the follow-up appointment.  

## 2013-01-21 ENCOUNTER — Ambulatory Visit: Payer: Self-pay | Admitting: Internal Medicine

## 2013-07-06 ENCOUNTER — Other Ambulatory Visit: Payer: Self-pay | Admitting: Dermatology

## 2013-07-20 ENCOUNTER — Other Ambulatory Visit: Payer: Self-pay | Admitting: Family Medicine

## 2013-07-20 ENCOUNTER — Ambulatory Visit
Admission: RE | Admit: 2013-07-20 | Discharge: 2013-07-20 | Disposition: A | Discharge status: Home / Self Care | Payer: 59 | Source: Ambulatory Visit | Attending: Family Medicine | Admitting: Family Medicine

## 2013-07-20 DIAGNOSIS — R221 Localized swelling, mass and lump, neck: Principal | ICD-10-CM

## 2013-07-20 DIAGNOSIS — R220 Localized swelling, mass and lump, head: Secondary | ICD-10-CM

## 2013-07-28 ENCOUNTER — Other Ambulatory Visit: Payer: Self-pay | Admitting: Family Medicine

## 2013-07-28 DIAGNOSIS — R9389 Abnormal findings on diagnostic imaging of other specified body structures: Secondary | ICD-10-CM

## 2013-08-01 ENCOUNTER — Ambulatory Visit
Admission: RE | Admit: 2013-08-01 | Discharge: 2013-08-01 | Disposition: A | Discharge status: Home / Self Care | Payer: 59 | Source: Ambulatory Visit | Attending: Family Medicine | Admitting: Family Medicine

## 2013-08-01 DIAGNOSIS — R9389 Abnormal findings on diagnostic imaging of other specified body structures: Secondary | ICD-10-CM

## 2013-08-01 MED ORDER — GADOBENATE DIMEGLUMINE 529 MG/ML IV SOLN
20.0000 mL | Freq: Once | INTRAVENOUS | Status: AC | PRN
  Administered 2013-08-01: 20 mL via INTRAVENOUS

## 2014-01-24 ENCOUNTER — Encounter: Payer: Self-pay | Admitting: Internal Medicine

## 2014-01-24 ENCOUNTER — Ambulatory Visit (INDEPENDENT_AMBULATORY_CARE_PROVIDER_SITE_OTHER): Payer: 59 | Admitting: Internal Medicine

## 2014-01-24 VITALS — BP 110/76 | HR 55 | Ht 74.0 in | Wt 193.0 lb

## 2014-01-24 DIAGNOSIS — E785 Hyperlipidemia, unspecified: Secondary | ICD-10-CM

## 2014-01-24 DIAGNOSIS — I2510 Atherosclerotic heart disease of native coronary artery without angina pectoris: Secondary | ICD-10-CM

## 2014-01-24 DIAGNOSIS — I10 Essential (primary) hypertension: Secondary | ICD-10-CM

## 2014-01-24 NOTE — Patient Instructions (Signed)
Your physician recommends that you return for lab work within a few weeks - you will need to be fasting.   Your physician wants you to follow-up in: 1 year with Dr. Debara Pickett. You will receive a reminder letter in the mail two months in advance. If you don't receive a letter, please call our office to schedule the follow-up appointment.

## 2014-01-24 NOTE — Progress Notes (Signed)
OFFICE NOTE  Chief Complaint:  Routine office visit  Primary Care Physician: Charles Neer, MD  HPI:  Charles Oconnor  is a 63 year old gentleman with history of hypertension, dyslipidemia and mild to moderate coronary disease. We have been following him for dyslipidemia and he has had liver enzyme abnormalities before on Zocor and was changed to Pravachol with pretty good control. Sometimes he gets chest discomfort; however, it is pretty short lived and possibly related to that. His EKG today shows a sinus bradycardia at 54 and I understand recently he was placed on a beta blocker which caused marked sinus bradycardia and that was promptly discontinued. At this point, although there is possibly an occasion for that given his history of coronary disease, his heart rate being low, he is unable to tolerate a beta blocker. He is on an ACE inhibitor which is pretty good at controlling his blood pressure as well as amlodipine.   Charles Oconnor returns today for followup. He reports doing fairly well. He's recently started doing some walking and has actually lost about 10 pounds in the last 6 months. He says he does feel better and have more energy. A portion is developed some peripheral neuropathy. He's been started on Neurontin by a neurologist in [place]. He seems to be tolerating this medicine although has some occasional lightheadedness and dizziness. He is peripheral nerve pain is better however does have numbness in his feet. He occasionally gets some mild swelling. He also was told that he had high vitamin B6 levels and was told to stop taking his multivitamin.  PMHx:  Past Medical History  Diagnosis Date  . Hypertension   . Dyslipidemia   . Coronary artery disease     mild to moderate  . Prostate cancer   . ED (erectile dysfunction)     Past Surgical History  Procedure Laterality Date  . Cardiac catheterization  2002, 2011    mild-mod CAD  . Appendectomy  1985  .  Prostatectomy  03/2007  . Total knee arthroplasty  04/2009    FAMHx:  Family History  Problem Relation Age of Onset  . Heart attack Father   . Diabetes Mother   . Heart failure Mother   . Coronary artery disease Mother   . Stroke Mother   . Prostate cancer Brother   . Heart attack Brother     also CVA  . Heart attack Brother     also CVA  . Cancer Brother     SOCHx:   reports that he has never smoked. He has never used smokeless tobacco. His alcohol and drug histories are not on file.  ALLERGIES:  Allergies  Allergen Reactions  . Zocor [Simvastatin]     Elevate liver enzymes    ROS: A comprehensive review of systems was negative except for: Neurological: positive for neuropathy  HOME MEDS: Current Outpatient Prescriptions  Medication Sig Dispense Refill  . amLODipine (NORVASC) 10 MG tablet Take 10 mg by mouth daily.      . Ascorbic Acid (VITAMIN C PO) Take by mouth daily.      Marland Kitchen aspirin 81 MG tablet Take 81 mg by mouth daily.      . fish oil-omega-3 fatty acids 1000 MG capsule Take 3 g by mouth daily.      Marland Kitchen gabapentin (NEURONTIN) 300 MG capsule Take 300 mg by mouth 2 (two) times daily.      Marland Kitchen lisinopril-hydrochlorothiazide (PRINZIDE,ZESTORETIC) 20-12.5 MG per tablet Take 2 tablets by mouth daily.       Marland Kitchen  Naproxen Sodium (ALEVE PO) Take 2 tablets by mouth daily.      . nitroGLYCERIN (NITROSTAT) 0.4 MG SL tablet Place 0.4 mg under the tongue every 5 (five) minutes as needed for chest pain.      . pantoprazole (PROTONIX) 40 MG tablet Take 40 mg by mouth daily.      . pravastatin (PRAVACHOL) 40 MG tablet Take 80 mg by mouth daily.        No current facility-administered medications for this visit.    LABS/IMAGING: No results found for this or any previous visit (from the past 48 hour(s)). No results found.  VITALS: BP 110/76  Pulse 55  Ht 6\' 2"  (1.88 m)  Wt 193 lb (87.544 kg)  BMI 24.77 kg/m2  EXAM: General appearance: alert and no distress Neck: no  adenopathy, no carotid bruit, no JVD, supple, symmetrical, trachea midline and thyroid not enlarged, symmetric, no tenderness/mass/nodules Lungs: clear to auscultation bilaterally Heart: regular rate and rhythm, S1, S2 normal, no murmur, click, rub or gallop Abdomen: soft, non-tender; bowel sounds normal; no masses,  no organomegaly Extremities: extremities normal, atraumatic, no cyanosis or edema Pulses: 2+ and symmetric Skin: Skin color, texture, turgor normal. No rashes or lesions Neurologic: Grossly normal  EKG: Sinus bradycardia 55  ASSESSMENT: 1. Hypertension-controlled 2. Dyslipidemia - needs to be assessed 3. Mild to moderate coronary disease - medically managed  PLAN: 1.   Charles Oconnor is doing fairly well.  He has had 10 pound weight loss and continues to do more exercise which I commended him on. Unfortunately is developed some peripheral neuropathy and is now taking gabapentin. His hypertension is well controlled. He's had no chest pain complaints. He is due for a lipid profile which we'll go ahead and obtain today. Plan to see him back annually or sooner as necessary.  Pixie Casino, MD, [facility] Attending Cardiologist [facility] 01/24/2014, 8:53 AM

## 2014-01-31 ENCOUNTER — Encounter: Payer: Self-pay | Admitting: *Deleted

## 2014-01-31 LAB — NMR LIPOPROFILE WITH LIPIDS
Cholesterol, Total: 157 mg/dL (ref 100–199)
HDL Particle Number: 29.5 umol/L — ABNORMAL LOW (ref >=30.5)
HDL SIZE: 8.5 nm — AB (ref >=9.2)
HDL-C: 48 mg/dL (ref >39)
LARGE VLDL-P: 2.2 nmol/L (ref <=2.7)
LDL CALC: 84 mg/dL (ref 0–99)
LDL PARTICLE NUMBER: 1137 nmol/L — AB (ref <1000)
LDL SIZE: 20.4 nm (ref >=20.8)
LP-IR Score: 60 — ABNORMAL HIGH (ref <=45)
Large HDL-P: 3.6 umol/L — ABNORMAL LOW (ref >=4.8)
SMALL LDL PARTICLE NUMBER: 560 nmol/L — AB (ref <=527)
Triglycerides: 125 mg/dL (ref 0–149)
VLDL SIZE: 43 nm (ref <=46.6)

## 2014-02-02 ENCOUNTER — Telehealth: Payer: Self-pay | Admitting: Internal Medicine

## 2014-02-02 NOTE — Telephone Encounter (Signed)
Would like his lab results from 01-27-14 please.

## 2014-02-02 NOTE — Telephone Encounter (Signed)
Spoke to patient. Result given . Verbalized understanding Informed patient that results were mailed ,he will probably receive it next week.

## 2014-08-18 DIAGNOSIS — D2261 Melanocytic nevi of right upper limb, including shoulder: Secondary | ICD-10-CM | POA: Diagnosis not present

## 2014-08-18 DIAGNOSIS — D1801 Hemangioma of skin and subcutaneous tissue: Secondary | ICD-10-CM | POA: Diagnosis not present

## 2014-08-18 DIAGNOSIS — D2272 Melanocytic nevi of left lower limb, including hip: Secondary | ICD-10-CM | POA: Diagnosis not present

## 2014-08-18 DIAGNOSIS — L821 Other seborrheic keratosis: Secondary | ICD-10-CM | POA: Diagnosis not present

## 2014-08-18 DIAGNOSIS — D225 Melanocytic nevi of trunk: Secondary | ICD-10-CM | POA: Diagnosis not present

## 2014-08-18 DIAGNOSIS — L570 Actinic keratosis: Secondary | ICD-10-CM | POA: Diagnosis not present

## 2014-08-18 DIAGNOSIS — Z85828 Personal history of other malignant neoplasm of skin: Secondary | ICD-10-CM | POA: Diagnosis not present

## 2014-08-18 DIAGNOSIS — D692 Other nonthrombocytopenic purpura: Secondary | ICD-10-CM | POA: Diagnosis not present

## 2014-08-18 DIAGNOSIS — L814 Other melanin hyperpigmentation: Secondary | ICD-10-CM | POA: Diagnosis not present

## 2014-08-18 DIAGNOSIS — D2271 Melanocytic nevi of right lower limb, including hip: Secondary | ICD-10-CM | POA: Diagnosis not present

## 2014-09-04 DIAGNOSIS — M545 Low back pain: Secondary | ICD-10-CM | POA: Diagnosis not present

## 2014-09-09 DIAGNOSIS — M545 Low back pain: Secondary | ICD-10-CM | POA: Diagnosis not present

## 2014-09-13 DIAGNOSIS — M47816 Spondylosis without myelopathy or radiculopathy, lumbar region: Secondary | ICD-10-CM | POA: Diagnosis not present

## 2014-09-26 DIAGNOSIS — M47816 Spondylosis without myelopathy or radiculopathy, lumbar region: Secondary | ICD-10-CM | POA: Diagnosis not present

## 2014-10-20 DIAGNOSIS — M5136 Other intervertebral disc degeneration, lumbar region: Secondary | ICD-10-CM | POA: Diagnosis not present

## 2014-10-26 DIAGNOSIS — M5136 Other intervertebral disc degeneration, lumbar region: Secondary | ICD-10-CM | POA: Diagnosis not present

## 2014-11-21 DIAGNOSIS — M5136 Other intervertebral disc degeneration, lumbar region: Secondary | ICD-10-CM | POA: Diagnosis not present

## 2014-12-07 DIAGNOSIS — M5136 Other intervertebral disc degeneration, lumbar region: Secondary | ICD-10-CM | POA: Diagnosis not present

## 2014-12-15 DIAGNOSIS — M25562 Pain in left knee: Secondary | ICD-10-CM | POA: Diagnosis not present

## 2014-12-29 DIAGNOSIS — E782 Mixed hyperlipidemia: Secondary | ICD-10-CM | POA: Diagnosis not present

## 2014-12-29 DIAGNOSIS — I2510 Atherosclerotic heart disease of native coronary artery without angina pectoris: Secondary | ICD-10-CM | POA: Diagnosis not present

## 2014-12-29 DIAGNOSIS — Z1211 Encounter for screening for malignant neoplasm of colon: Secondary | ICD-10-CM | POA: Diagnosis not present

## 2014-12-29 DIAGNOSIS — R5383 Other fatigue: Secondary | ICD-10-CM | POA: Diagnosis not present

## 2014-12-29 DIAGNOSIS — Z23 Encounter for immunization: Secondary | ICD-10-CM | POA: Diagnosis not present

## 2014-12-29 DIAGNOSIS — I119 Hypertensive heart disease without heart failure: Secondary | ICD-10-CM | POA: Diagnosis not present

## 2014-12-29 DIAGNOSIS — G629 Polyneuropathy, unspecified: Secondary | ICD-10-CM | POA: Diagnosis not present

## 2014-12-29 DIAGNOSIS — F411 Generalized anxiety disorder: Secondary | ICD-10-CM | POA: Diagnosis not present

## 2014-12-29 DIAGNOSIS — C61 Malignant neoplasm of prostate: Secondary | ICD-10-CM | POA: Diagnosis not present

## 2014-12-29 DIAGNOSIS — N529 Male erectile dysfunction, unspecified: Secondary | ICD-10-CM | POA: Diagnosis not present

## 2014-12-29 DIAGNOSIS — Z0000 Encounter for general adult medical examination without abnormal findings: Secondary | ICD-10-CM | POA: Diagnosis not present

## 2014-12-29 DIAGNOSIS — M549 Dorsalgia, unspecified: Secondary | ICD-10-CM | POA: Diagnosis not present

## 2015-01-03 DIAGNOSIS — Z1211 Encounter for screening for malignant neoplasm of colon: Secondary | ICD-10-CM | POA: Diagnosis not present

## 2015-01-05 DIAGNOSIS — M5136 Other intervertebral disc degeneration, lumbar region: Secondary | ICD-10-CM | POA: Diagnosis not present

## 2015-02-02 ENCOUNTER — Encounter: Payer: Self-pay | Admitting: Internal Medicine

## 2015-02-02 ENCOUNTER — Encounter: Payer: Self-pay | Admitting: *Deleted

## 2015-02-02 ENCOUNTER — Ambulatory Visit (INDEPENDENT_AMBULATORY_CARE_PROVIDER_SITE_OTHER): Payer: Medicare Other | Admitting: Internal Medicine

## 2015-02-02 VITALS — HR 54 | Ht 73.5 in | Wt 197.0 lb

## 2015-02-02 DIAGNOSIS — I10 Essential (primary) hypertension: Secondary | ICD-10-CM

## 2015-02-02 DIAGNOSIS — I2510 Atherosclerotic heart disease of native coronary artery without angina pectoris: Secondary | ICD-10-CM | POA: Diagnosis not present

## 2015-02-02 DIAGNOSIS — E785 Hyperlipidemia, unspecified: Secondary | ICD-10-CM | POA: Diagnosis not present

## 2015-02-02 MED ORDER — EZETIMIBE 10 MG PO TABS
10.0000 mg | ORAL_TABLET | Freq: Every day | ORAL | Status: DC
Start: 2015-02-02 — End: 2015-02-02

## 2015-02-02 MED ORDER — EZETIMIBE 10 MG PO TABS: 10.0000 mg | ORAL_TABLET | Freq: Every day | ORAL | Status: DC

## 2015-02-02 NOTE — Patient Instructions (Signed)
Your physician wants you to follow-up in: [place] will receive a reminder letter in the mail two months in advance. If you don't receive a letter, please call our office to schedule the follow-up appointment.  Your physician has requested that you have a lexiscan myoview. For further information please visit [email or web address]. Please follow instruction sheet, as given.   START ZETIA 10 MG ONCE DAILY

## 2015-02-02 NOTE — Progress Notes (Signed)
OFFICE NOTE  Chief Complaint:  Chest and back pain  Primary Care Physician: Mayra Neer, MD  HPI:  Charles Oconnor  is a 64 year old gentleman with history of hypertension, dyslipidemia and mild to moderate coronary disease. We have been following him for dyslipidemia and he has had liver enzyme abnormalities before on Zocor and was changed to Pravachol with pretty good control. Sometimes he gets chest discomfort; however, it is pretty short lived and possibly related to that. His EKG today shows a sinus bradycardia at 54 and I understand recently he was placed on a beta blocker which caused marked sinus bradycardia and that was promptly discontinued. At this point, although there is possibly an occasion for that given his history of coronary disease, his heart rate being low, he is unable to tolerate a beta blocker. He is on an ACE inhibitor which is pretty good at controlling his blood pressure as well as amlodipine.   Charles Oconnor returns today for followup. He reports doing fairly well. He's recently started doing some walking and has actually lost about 10 pounds in the last 6 months. He says he does feel better and have more energy. A portion is developed some peripheral neuropathy. He's been started on Neurontin by a neurologist in [place]. He seems to be tolerating this medicine although has some occasional lightheadedness and dizziness. He is peripheral nerve pain is better however does have numbness in his feet. He occasionally gets some mild swelling. He also was told that he had high vitamin B6 levels and was told to stop taking his multivitamin.  I the pleasure see Charles Oconnor back in the office today. Overall he is doing fairly well except he is struggling with low back pain and some neuropathic pain in his legs. He's been getting back injections without much improvement. He's also been describing some occasional chest discomfort and progressive fatigue over the past several  months. He feels like he has no energy. There is also complaints of mid back pain between his shoulder blades which occurs on unusual occasions and not associated with his low back pain. Sometimes it's a little worse when exerting himself although he's not been as active due to his back problems. As a reminder he had heart catheterization in 2011 with Dr. Tina Griffiths, which demonstrated mild to moderate coronary artery disease.  He recently had lab work through his primary care provider which looks fairly normal except for an elevated cholesterol profile. His total cholesterol is 166, HL 43, triglycerides 112 and LDL 101. He's currently on pravastatin 80 mg daily and says that he has had problems with myalgias and liver function abnormalities on simvastatin in the past.  PMHx:  Past Medical History  Diagnosis Date  . Hypertension   . Dyslipidemia   . Coronary artery disease     mild to moderate  . Prostate cancer   . ED (erectile dysfunction)     Past Surgical History  Procedure Laterality Date  . Cardiac catheterization  2002, 2011    mild-mod CAD  . Appendectomy  1985  . Prostatectomy  03/2007  . Total knee arthroplasty  04/2009    FAMHx:  Family History  Problem Relation Age of Onset  . Heart attack Father   . Diabetes Mother   . Heart failure Mother   . Coronary artery disease Mother   . Stroke Mother   . Prostate cancer Brother   . Heart attack Brother     also CVA  . Heart  attack Brother     also CVA  . Cancer Brother     SOCHx:   reports that he has never smoked. He has never used smokeless tobacco. His alcohol and drug histories are not on file.  ALLERGIES:  Allergies  Allergen Reactions  . Zocor [Simvastatin]     Elevate liver enzymes    ROS: A comprehensive review of systems was negative except for: Cardiovascular: positive for chest pressure/discomfort Musculoskeletal: positive for back pain Neurological: positive for neuropathy  HOME MEDS: Current  Outpatient Prescriptions  Medication Sig Dispense Refill  . amLODipine (NORVASC) 10 MG tablet Take 10 mg by mouth daily.    Marland Kitchen aspirin 81 MG tablet Take 81 mg by mouth daily.    Marland Kitchen lisinopril-hydrochlorothiazide (PRINZIDE,ZESTORETIC) 20-12.5 MG per tablet Take 2 tablets by mouth daily.     . pantoprazole (PROTONIX) 40 MG tablet Take 40 mg by mouth daily.    . pravastatin (PRAVACHOL) 80 MG tablet Take 1 tablet by mouth daily.    Marland Kitchen ezetimibe (ZETIA) 10 MG tablet Take 1 tablet (10 mg total) by mouth daily. 30 tablet 11   No current facility-administered medications for this visit.    LABS/IMAGING: No results found for this or any previous visit (from the past 48 hour(s)). No results found.  VITALS: Pulse 54  Ht 6' 1.5" (1.867 m)  Wt 197 lb (89.359 kg)  BMI 25.64 kg/m2  EXAM: General appearance: alert and no distress Neck: no adenopathy, no carotid bruit, no JVD, supple, symmetrical, trachea midline and thyroid not enlarged, symmetric, no tenderness/mass/nodules Lungs: clear to auscultation bilaterally Heart: regular rate and rhythm, S1, S2 normal, no murmur, click, rub or gallop Abdomen: soft, non-tender; bowel sounds normal; no masses,  no organomegaly Extremities: extremities normal, atraumatic, no cyanosis or edema Pulses: 2+ and symmetric Skin: Skin color, texture, turgor normal. No rashes or lesions Neurologic: Grossly normal  EKG: Sinus bradycardia 54  ASSESSMENT: 1. Chest and back pain with progressive fatigue 2. Hypertension-controlled 3. Dyslipidemia - not at goal LDL of <70 4. Mild to moderate coronary disease  PLAN: 1.   Charles Oconnor has been describing chest and back discomfort with some progressive fatigue over the past several months. He says this is fairly unusual for him and has not had complaints like this in the past. Blood pressure appears to be stable although cholesterol is not at goal. He did have mild to moderate coronary disease by cardiac catheterization  in 2011. I would recommend a Lexiscan Myoview to evaluate for possible progressive ischemia. In addition he does need better cholesterol control. Rather than changing statins, I think good option would be adding Zetia 10 mg to his current regimen. We'll provide samples today and a co-pay assistance card. As have repeat lab work in November through his primary care provider and hopefully can have his cholesterol checked at that time. I will contact him with results of his stress test and further management will be based on those findings.  Pixie Casino, MD, [facility] Attending Cardiologist [place] [facility] 02/02/2015, 9:09 AM

## 2015-02-06 ENCOUNTER — Encounter: Payer: Self-pay | Admitting: Internal Medicine

## 2015-02-09 DIAGNOSIS — M5136 Other intervertebral disc degeneration, lumbar region: Secondary | ICD-10-CM | POA: Diagnosis not present

## 2015-02-13 ENCOUNTER — Telehealth (HOSPITAL_COMMUNITY): Payer: Self-pay

## 2015-02-13 DIAGNOSIS — Z23 Encounter for immunization: Secondary | ICD-10-CM | POA: Diagnosis not present

## 2015-02-13 NOTE — Telephone Encounter (Signed)
Encounter complete. 

## 2015-02-13 NOTE — Telephone Encounter (Signed)
Unable to reach pt. Disconnected/no longer in service on all contact numbers.

## 2015-02-16 ENCOUNTER — Ambulatory Visit (HOSPITAL_COMMUNITY)
Admission: RE | Admit: 2015-02-16 | Discharge: 2015-02-16 | Disposition: A | Discharge status: Home / Self Care | Payer: Medicare Other | Source: Ambulatory Visit | Attending: Cardiovascular Disease | Admitting: Cardiovascular Disease

## 2015-02-16 ENCOUNTER — Encounter: Payer: Self-pay | Admitting: Internal Medicine

## 2015-02-16 DIAGNOSIS — R42 Dizziness and giddiness: Secondary | ICD-10-CM | POA: Diagnosis not present

## 2015-02-16 DIAGNOSIS — R5383 Other fatigue: Secondary | ICD-10-CM | POA: Insufficient documentation

## 2015-02-16 DIAGNOSIS — I2510 Atherosclerotic heart disease of native coronary artery without angina pectoris: Secondary | ICD-10-CM | POA: Insufficient documentation

## 2015-02-16 DIAGNOSIS — R079 Chest pain, unspecified: Secondary | ICD-10-CM | POA: Diagnosis not present

## 2015-02-16 DIAGNOSIS — R9439 Abnormal result of other cardiovascular function study: Secondary | ICD-10-CM | POA: Diagnosis not present

## 2015-02-16 DIAGNOSIS — I10 Essential (primary) hypertension: Secondary | ICD-10-CM | POA: Diagnosis not present

## 2015-02-16 DIAGNOSIS — Z8249 Family history of ischemic heart disease and other diseases of the circulatory system: Secondary | ICD-10-CM | POA: Diagnosis not present

## 2015-02-16 LAB — MYOCARDIAL PERFUSION IMAGING
CHL CUP NUCLEAR SSS: 9
CSEPPHR: 77 {beats}/min
LVDIAVOL: 123 mL
LVSYSVOL: 60 mL
NUC STRESS TID: 1.19
Rest HR: 46 {beats}/min
SDS: 4
SRS: 7

## 2015-02-16 MED ORDER — REGADENOSON 0.4 MG/5ML IV SOLN
0.4000 mg | Freq: Once | INTRAVENOUS | Status: AC
  Administered 2015-02-16: 0.4 mg via INTRAVENOUS

## 2015-02-16 MED ORDER — TECHNETIUM TC 99M SESTAMIBI GENERIC - CARDIOLITE
10.5000 | Freq: Once | INTRAVENOUS | Status: AC | PRN
  Administered 2015-02-16: 10.5 via INTRAVENOUS

## 2015-02-16 MED ORDER — TECHNETIUM TC 99M SESTAMIBI GENERIC - CARDIOLITE
31.0000 | Freq: Once | INTRAVENOUS | Status: AC | PRN
  Administered 2015-02-16: 31 via INTRAVENOUS

## 2015-02-16 MED ORDER — AMINOPHYLLINE 25 MG/ML IV SOLN
75.0000 mg | Freq: Once | INTRAVENOUS | Status: AC
  Administered 2015-02-16: 75 mg via INTRAVENOUS

## 2015-02-16 NOTE — Telephone Encounter (Signed)
Pt is returning a call from our office , he says that he was in the office this morning for a stress test and that someone called him back possibly in regards to the test. Please call  Thanks

## 2015-02-16 NOTE — Telephone Encounter (Signed)
This encounter was created in error - please disregard.

## 2015-03-02 ENCOUNTER — Encounter: Payer: Self-pay | Admitting: Internal Medicine

## 2015-03-02 ENCOUNTER — Ambulatory Visit
Admission: RE | Admit: 2015-03-02 | Discharge: 2015-03-02 | Disposition: A | Discharge status: Home / Self Care | Payer: Medicare Other | Source: Ambulatory Visit | Attending: Internal Medicine | Admitting: Internal Medicine

## 2015-03-02 ENCOUNTER — Ambulatory Visit (INDEPENDENT_AMBULATORY_CARE_PROVIDER_SITE_OTHER): Payer: Medicare Other | Admitting: Internal Medicine

## 2015-03-02 VITALS — BP 150/85 | HR 59 | Ht 73.5 in | Wt 196.5 lb

## 2015-03-02 DIAGNOSIS — D689 Coagulation defect, unspecified: Secondary | ICD-10-CM | POA: Diagnosis not present

## 2015-03-02 DIAGNOSIS — I200 Unstable angina: Secondary | ICD-10-CM | POA: Diagnosis not present

## 2015-03-02 DIAGNOSIS — R9439 Abnormal result of other cardiovascular function study: Secondary | ICD-10-CM

## 2015-03-02 DIAGNOSIS — Z01818 Encounter for other preprocedural examination: Secondary | ICD-10-CM | POA: Diagnosis not present

## 2015-03-02 DIAGNOSIS — E785 Hyperlipidemia, unspecified: Secondary | ICD-10-CM

## 2015-03-02 DIAGNOSIS — R5383 Other fatigue: Secondary | ICD-10-CM

## 2015-03-02 DIAGNOSIS — R079 Chest pain, unspecified: Secondary | ICD-10-CM | POA: Diagnosis not present

## 2015-03-02 DIAGNOSIS — I2583 Coronary atherosclerosis due to lipid rich plaque: Secondary | ICD-10-CM

## 2015-03-02 DIAGNOSIS — R931 Abnormal findings on diagnostic imaging of heart and coronary circulation: Secondary | ICD-10-CM | POA: Diagnosis not present

## 2015-03-02 DIAGNOSIS — I10 Essential (primary) hypertension: Secondary | ICD-10-CM

## 2015-03-02 DIAGNOSIS — Z79899 Other long term (current) drug therapy: Secondary | ICD-10-CM

## 2015-03-02 DIAGNOSIS — I2510 Atherosclerotic heart disease of native coronary artery without angina pectoris: Secondary | ICD-10-CM

## 2015-03-02 MED ORDER — NITROGLYCERIN 0.4 MG SL SUBL: 0.4000 mg | SUBLINGUAL_TABLET | SUBLINGUAL | Status: DC | PRN

## 2015-03-02 NOTE — Patient Instructions (Addendum)
Your physician has requested that you have a cardiac catheterization @ [facility] with Dr. Debara Pickett. Cardiac catheterization is used to diagnose and/or treat various heart conditions. Doctors may recommend this procedure for a number of different reasons. The most common reason is to evaluate chest pain. Chest pain can be a symptom of coronary artery disease (CAD), and cardiac catheterization can show whether plaque is narrowing or blocking your heart's arteries. This procedure is also used to evaluate the valves, as well as measure the blood flow and oxygen levels in different parts of your heart. For further information please visit [email or web address]. Please follow instruction sheet, as given.  Following your catheterization, you will not be allowed to drive for 3 days.  No lifting, pushing, or pulling greater that 10 pounds is allowed for 1 week.  You will be required to have the following tests prior to the procedure:  1. Blood work - the blood work can be done no more than 7 days prior to the procedure.  It can be done at any [facility] lab. There is a lab downstairs on the first floor of this building in [address] and one at [address].  2. Chest X-ray - this can be done at [facility] in the [place] @ [address]   Please arrange a cath follow up appointment with Dr. Debara Pickett.   Dr. Debara Pickett has prescribed nitroglycerin to use as needed for chest pain - max 3 doses

## 2015-03-04 ENCOUNTER — Encounter: Payer: Self-pay | Admitting: Internal Medicine

## 2015-03-04 DIAGNOSIS — I200 Unstable angina: Secondary | ICD-10-CM | POA: Insufficient documentation

## 2015-03-04 DIAGNOSIS — R9439 Abnormal result of other cardiovascular function study: Secondary | ICD-10-CM | POA: Insufficient documentation

## 2015-03-04 NOTE — H&P (Signed)
OFFICE NOTE  Chief Complaint:  Follow-up stress test  Primary Care Physician: Mayra Neer, MD  HPI:  Charles Oconnor  is a 64 year old gentleman with history of hypertension, dyslipidemia and mild to moderate coronary disease. We have been following him for dyslipidemia and he has had liver enzyme abnormalities before on Zocor and was changed to Pravachol with pretty good control. Sometimes he gets chest discomfort; however, it is pretty short lived and possibly related to that. His EKG today shows a sinus bradycardia at 54 and I understand recently he was placed on a beta blocker which caused marked sinus bradycardia and that was promptly discontinued. At this point, although there is possibly an occasion for that given his history of coronary disease, his heart rate being low, he is unable to tolerate a beta blocker. He is on an ACE inhibitor which is pretty good at controlling his blood pressure as well as amlodipine.   Charles Oconnor returns today for followup. He reports doing fairly well. He's recently started doing some walking and has actually lost about 10 pounds in the last 6 months. He says he does feel better and have more energy. A portion is developed some peripheral neuropathy. He's been started on Neurontin by a neurologist in [place]. He seems to be tolerating this medicine although has some occasional lightheadedness and dizziness. He is peripheral nerve pain is better however does have numbness in his feet. He occasionally gets some mild swelling. He also was told that he had high vitamin B6 levels and was told to stop taking his multivitamin.  I the pleasure see Charles Oconnor back in the office today. Overall he is doing fairly well except he is struggling with low back pain and some neuropathic pain in his legs. He's been getting back injections without much improvement. He's also been describing some occasional chest discomfort and progressive fatigue over the past  several months. He feels like he has no energy. There is also complaints of mid back pain between his shoulder blades which occurs on unusual occasions and not associated with his low back pain. Sometimes it's a little worse when exerting himself although he's not been as active due to his back problems. As a reminder he had heart catheterization in 2011 with Dr. Tina Griffiths, which demonstrated mild to moderate coronary artery disease.  He recently had lab work through his primary care provider which looks fairly normal except for an elevated cholesterol profile. His total cholesterol is 166, HL 43, triglycerides 112 and LDL 101. He's currently on pravastatin 80 mg daily and says that he has had problems with myalgias and liver function abnormalities on simvastatin in the past.  Charles Oconnor returns today for follow-up of a stress test. The results are as follows:   The left ventricular ejection fraction is mildly decreased (45-54%).  Nuclear stress EF: 52%.  No T wave inversion was noted during stress.  There was no ST segment deviation noted during stress.  Defect 1: There is a medium defect of moderate severity.  There is a moderate-sized and intensity, partially reversible inferoseptal perfusion defect which could represent ischemia. LVEF 52% with basal inferoseptal dyskinesis. This is an intermediate risk study.   I discussed these findings with him at length today in the office and possible management strategies, including additional medical therapy, perhaps adding a long-acting nitrate. A beta blocker is contraindicated due to his bradycardia. The other option would be cardiac catheterization for definitive evaluation of his reversible defect. He is  in favor of cardiac catheterization. I discussed the risks and benefits of this procedure today and he is willing to proceed.  PMHx:  Past Medical History  Diagnosis Date  . Hypertension   . Dyslipidemia   . Coronary artery disease     mild to  moderate  . Prostate cancer   . ED (erectile dysfunction)     Past Surgical History  Procedure Laterality Date  . Cardiac catheterization  2002, 2011    mild-mod CAD  . Appendectomy  1985  . Prostatectomy  03/2007  . Total knee arthroplasty  04/2009    FAMHx:  Family History  Problem Relation Age of Onset  . Heart attack Father   . Diabetes Mother   . Heart failure Mother   . Coronary artery disease Mother   . Stroke Mother   . Prostate cancer Brother   . Heart attack Brother     also CVA  . Heart attack Brother     also CVA  . Cancer Brother     SOCHx:   reports that he has never smoked. He has never used smokeless tobacco. His alcohol and drug histories are not on file.  ALLERGIES:  Allergies  Allergen Reactions  . Zocor [Simvastatin]     Elevate liver enzymes    ROS: A comprehensive review of systems was negative except for: Cardiovascular: positive for chest pressure/discomfort Musculoskeletal: positive for back pain Neurological: positive for neuropathy  HOME MEDS: Current Outpatient Prescriptions  Medication Sig Dispense Refill  . amLODipine (NORVASC) 10 MG tablet Take 10 mg by mouth daily.    Marland Kitchen aspirin 81 MG tablet Take 81 mg by mouth daily.    Marland Kitchen lisinopril-hydrochlorothiazide (PRINZIDE,ZESTORETIC) 20-12.5 MG per tablet Take 2 tablets by mouth daily.     . nitroGLYCERIN (NITROSTAT) 0.4 MG SL tablet Place 1 tablet (0.4 mg total) under the tongue every 5 (five) minutes as needed for chest pain. 25 tablet 3  . pantoprazole (PROTONIX) 40 MG tablet Take 40 mg by mouth daily.    . pravastatin (PRAVACHOL) 80 MG tablet Take 1 tablet by mouth daily.     No current facility-administered medications for this visit.    LABS/IMAGING: No results found for this or any previous visit (from the past 48 hour(s)). Dg Chest 2 View  03/02/2015  CLINICAL DATA:  Chest pain. Coronary artery disease. Prostate carcinoma. Preprocedure exam for cardiac catheterization. EXAM:  CHEST  2 VIEW COMPARISON:  01/04/2010 FINDINGS: The heart size and mediastinal contours are within normal limits. Chronic elevation of right hemidiaphragm is stable. Bibasilar scarring again demonstrated, right side greater than left. No evidence of pulmonary consolidation or edema. No evidence of pleural effusion. IMPRESSION: No active disease. Stable elevation of right hemidiaphragm with right greater than left basilar scarring. Electronically Signed   By: Earle Gell M.D.   On: 03/02/2015 15:12    VITALS: BP 150/85 mmHg  Pulse 59  Ht 6' 1.5" (1.867 m)  Wt 196 lb 8 oz (89.132 kg)  BMI 25.57 kg/m2  EXAM: Deferred  EKG: Deferred  ASSESSMENT:  Chest and back pain with progressive fatigue - intermediate risk nuclear stress test with reversible ischemia  Hypertension-controlled  Dyslipidemia - not at goal LDL of <70  Mild to moderate coronary disease  PLAN: 1.   Charles Oconnor has been describing chest and back discomfort with some progressive fatigue over the past several months. He says this is fairly unusual for him and has not had complaints like this in  the past. Blood pressure appears to be stable although cholesterol is not at goal. He did have mild to moderate coronary disease by cardiac catheterization in 2011. His stress Myoview was abnormal and intermediate risk with reversible ischemia. We discussed cardiac catheterization and he is interested in proceeding that route. We'll plan to schedule his car catheterization within the next couple of weeks. I did discuss the risks and benefits of the procedure with him today and he is willing proceed.  Pixie Casino, MD, [facility] Attending Cardiologist [place] 03/04/2015, 2:03 PM

## 2015-03-07 DIAGNOSIS — D689 Coagulation defect, unspecified: Secondary | ICD-10-CM | POA: Diagnosis not present

## 2015-03-07 DIAGNOSIS — R5383 Other fatigue: Secondary | ICD-10-CM | POA: Diagnosis not present

## 2015-03-07 DIAGNOSIS — Z01818 Encounter for other preprocedural examination: Secondary | ICD-10-CM | POA: Diagnosis not present

## 2015-03-07 DIAGNOSIS — Z79899 Other long term (current) drug therapy: Secondary | ICD-10-CM | POA: Diagnosis not present

## 2015-03-07 LAB — CBC
HCT: 41.1 % (ref 39.0–52.0)
Hemoglobin: 14.1 g/dL (ref 13.0–17.0)
MCH: 29.5 pg (ref 26.0–34.0)
MCHC: 34.3 g/dL (ref 30.0–36.0)
MCV: 86 fL (ref 78.0–100.0)
MPV: 9.9 fL (ref 8.6–12.4)
PLATELETS: 223 10*3/uL (ref 150–400)
RBC: 4.78 MIL/uL (ref 4.22–5.81)
RDW: 12.7 % (ref 11.5–15.5)
WBC: 6.5 10*3/uL (ref 4.0–10.5)

## 2015-03-07 LAB — BASIC METABOLIC PANEL
BUN: 20 mg/dL (ref 7–25)
CHLORIDE: 105 mmol/L (ref 98–110)
CO2: 29 mmol/L (ref 20–31)
CREATININE: 1.05 mg/dL (ref 0.70–1.25)
Calcium: 10.1 mg/dL (ref 8.6–10.3)
Glucose, Bld: 118 mg/dL — ABNORMAL HIGH (ref 65–99)
POTASSIUM: 4.2 mmol/L (ref 3.5–5.3)
Sodium: 141 mmol/L (ref 135–146)

## 2015-03-07 LAB — PROTIME-INR
INR: 1.02 (ref <1.50)
PROTHROMBIN TIME: 13.5 s (ref 11.6–15.2)

## 2015-03-07 LAB — TSH: TSH: 0.72 u[IU]/mL (ref 0.350–4.500)

## 2015-03-07 LAB — APTT: APTT: 31 s (ref 24–37)

## 2015-03-12 ENCOUNTER — Ambulatory Visit (HOSPITAL_COMMUNITY)
Admission: RE | Admit: 2015-03-12 | Discharge: 2015-03-12 | Disposition: A | Discharge status: Home / Self Care | Payer: Medicare Other | Source: Ambulatory Visit | Attending: Internal Medicine | Admitting: Internal Medicine

## 2015-03-12 ENCOUNTER — Encounter (HOSPITAL_COMMUNITY): Payer: Self-pay | Admitting: Internal Medicine

## 2015-03-12 ENCOUNTER — Encounter (HOSPITAL_COMMUNITY)
Admission: RE | Disposition: A | Discharge status: Home / Self Care | Payer: Self-pay | Source: Ambulatory Visit | Attending: Internal Medicine

## 2015-03-12 DIAGNOSIS — I200 Unstable angina: Secondary | ICD-10-CM | POA: Diagnosis present

## 2015-03-12 DIAGNOSIS — I2510 Atherosclerotic heart disease of native coronary artery without angina pectoris: Secondary | ICD-10-CM | POA: Diagnosis not present

## 2015-03-12 DIAGNOSIS — R9439 Abnormal result of other cardiovascular function study: Secondary | ICD-10-CM

## 2015-03-12 DIAGNOSIS — R079 Chest pain, unspecified: Secondary | ICD-10-CM

## 2015-03-12 DIAGNOSIS — E785 Hyperlipidemia, unspecified: Secondary | ICD-10-CM | POA: Diagnosis present

## 2015-03-12 DIAGNOSIS — I10 Essential (primary) hypertension: Secondary | ICD-10-CM | POA: Diagnosis present

## 2015-03-12 HISTORY — PX: CARDIAC CATHETERIZATION: SHX172

## 2015-03-12 SURGERY — LEFT HEART CATH AND CORONARY ANGIOGRAPHY
Anesthesia: LOCAL

## 2015-03-12 MED ORDER — SODIUM CHLORIDE 0.9 % WEIGHT BASED INFUSION: 3.0000 mL/kg/h | INTRAVENOUS | Status: DC

## 2015-03-12 MED ORDER — ONDANSETRON HCL 4 MG/2ML IJ SOLN: 4.0000 mg | Freq: Four times a day (QID) | INTRAMUSCULAR | Status: DC | PRN

## 2015-03-12 MED ORDER — HEPARIN (PORCINE) IN NACL 2-0.9 UNIT/ML-% IJ SOLN
INTRAMUSCULAR | Status: AC
  Filled 2015-03-12: qty 1500

## 2015-03-12 MED ORDER — SODIUM CHLORIDE 0.9 % IJ SOLN: 3.0000 mL | INTRAMUSCULAR | Status: DC | PRN

## 2015-03-12 MED ORDER — SODIUM CHLORIDE 0.9 % IJ SOLN: 3.0000 mL | Freq: Two times a day (BID) | INTRAMUSCULAR | Status: DC

## 2015-03-12 MED ORDER — VERAPAMIL HCL 2.5 MG/ML IV SOLN
INTRA_ARTERIAL | Status: DC | PRN
  Administered 2015-03-12 (×3): 5 mL via INTRA_ARTERIAL

## 2015-03-12 MED ORDER — ACETAMINOPHEN 325 MG PO TABS: 650.0000 mg | ORAL_TABLET | ORAL | Status: DC | PRN

## 2015-03-12 MED ORDER — HEPARIN SODIUM (PORCINE) 1000 UNIT/ML IJ SOLN
INTRAMUSCULAR | Status: AC
  Filled 2015-03-12: qty 1

## 2015-03-12 MED ORDER — FENTANYL CITRATE (PF) 100 MCG/2ML IJ SOLN
INTRAMUSCULAR | Status: AC
  Filled 2015-03-12: qty 4

## 2015-03-12 MED ORDER — LIDOCAINE HCL (PF) 1 % IJ SOLN
INTRAMUSCULAR | Status: DC | PRN
  Administered 2015-03-12: 5 mL

## 2015-03-12 MED ORDER — MIDAZOLAM HCL 2 MG/2ML IJ SOLN
INTRAMUSCULAR | Status: AC
  Filled 2015-03-12: qty 4

## 2015-03-12 MED ORDER — SODIUM CHLORIDE 0.9 % IV SOLN: 250.0000 mL | INTRAVENOUS | Status: DC | PRN

## 2015-03-12 MED ORDER — FENTANYL CITRATE (PF) 100 MCG/2ML IJ SOLN
INTRAMUSCULAR | Status: DC | PRN
  Administered 2015-03-12: 25 ug via INTRAVENOUS

## 2015-03-12 MED ORDER — LIDOCAINE HCL (PF) 1 % IJ SOLN
INTRAMUSCULAR | Status: AC
  Filled 2015-03-12: qty 30

## 2015-03-12 MED ORDER — MIDAZOLAM HCL 2 MG/2ML IJ SOLN
INTRAMUSCULAR | Status: DC | PRN
  Administered 2015-03-12: 1 mg via INTRAVENOUS

## 2015-03-12 MED ORDER — ASPIRIN 81 MG PO CHEW: 81.0000 mg | CHEWABLE_TABLET | ORAL | Status: DC

## 2015-03-12 MED ORDER — IOHEXOL 350 MG/ML SOLN
INTRAVENOUS | Status: DC | PRN
  Administered 2015-03-12: 80 mL via INTRA_ARTERIAL

## 2015-03-12 MED ORDER — SODIUM CHLORIDE 0.9 % IV SOLN
INTRAVENOUS | Status: DC
  Administered 2015-03-12: 07:00:00 via INTRAVENOUS

## 2015-03-12 MED ORDER — HEPARIN SODIUM (PORCINE) 1000 UNIT/ML IJ SOLN
INTRAMUSCULAR | Status: DC | PRN
  Administered 2015-03-12: 4500 [IU] via INTRAVENOUS

## 2015-03-12 MED ORDER — VERAPAMIL HCL 2.5 MG/ML IV SOLN
INTRAVENOUS | Status: AC
  Filled 2015-03-12: qty 2

## 2015-03-12 SURGICAL SUPPLY — 11 items
CATH INFINITI 5FR ANG PIGTAIL (CATHETERS) ×2 IMPLANT
CATH INFINITI JR4 5F (CATHETERS) ×2 IMPLANT
CATH OPTITORQUE TIG 4.0 5F (CATHETERS) ×2 IMPLANT
DEVICE RAD COMP TR BAND LRG (VASCULAR PRODUCTS) ×2 IMPLANT
GLIDESHEATH SLEND SS 6F .021 (SHEATH) ×2 IMPLANT
KIT HEART LEFT (KITS) ×2 IMPLANT
PACK CARDIAC CATHETERIZATION (CUSTOM PROCEDURE TRAY) ×2 IMPLANT
SYR MEDRAD MARK V 150ML (SYRINGE) ×2 IMPLANT
TRANSDUCER W/STOPCOCK (MISCELLANEOUS) ×2 IMPLANT
TUBING CIL FLEX 10 FLL-RA (TUBING) ×2 IMPLANT
WIRE SAFE-T 1.5MM-J .035X260CM (WIRE) ×2 IMPLANT

## 2015-03-12 NOTE — H&P (Signed)
     INTERVAL PROCEDURE H&P  History and Physical Interval Note:  03/12/2015 7:42 AM  Charles Oconnor has presented today for their planned procedure. The various methods of treatment have been discussed with the patient and family. After consideration of risks, benefits and other options for treatment, the patient has consented to the procedure.  The patients' outpatient history has been reviewed, patient examined, and no change in status from most recent office note within the past 30 days. I have reviewed the patients' chart and labs and will proceed as planned. Questions were answered to the patient's satisfaction.    Cath Lab Visit (complete for each Cath Lab visit)  Clinical Evaluation Leading to the Procedure:   ACS: No.  Non-ACS:    Anginal Classification: CCS II  Anti-ischemic medical therapy: Maximal Therapy (2 or more classes of medications)  Non-Invasive Test Results: Intermediate-risk stress test findings: cardiac mortality 1-3%/year  Prior CABG: No previous CABG   Pixie Casino, MD, [facility] Attending Cardiologist [place] 03/12/2015, 7:42 AM

## 2015-03-12 NOTE — Discharge Instructions (Signed)
Radial Site Care °Refer to this sheet in the next few weeks. These instructions provide you with information about caring for yourself after your procedure. Your health care provider may also give you more specific instructions. Your treatment has been planned according to current medical practices, but problems sometimes occur. Call your health care provider if you have any problems or questions after your procedure. °WHAT TO EXPECT AFTER THE PROCEDURE °After your procedure, it is typical to have the following: °· Bruising at the radial site that usually fades within 1-2 weeks. °· Blood collecting in the tissue (hematoma) that may be painful to the touch. It should usually decrease in size and tenderness within 1-2 weeks. °HOME CARE INSTRUCTIONS °· Take medicines only as directed by your health care provider. °· You may shower 24-48 hours after the procedure or as directed by your health care provider. Remove the bandage (dressing) and gently wash the site with plain soap and water. Pat the area dry with a clean towel. Do not rub the site, because this may cause bleeding. °· Do not take baths, swim, or use a hot tub until your health care provider approves. °· Check your insertion site every day for redness, swelling, or drainage. °· Do not apply powder or lotion to the site. °· Do not flex or bend the affected arm for 24 hours or as directed by your health care provider. °· Do not push or pull heavy objects with the affected arm for 24 hours or as directed by your health care provider. °· Do not lift over 10 lb (4.5 kg) for 5 days after your procedure or as directed by your health care provider. °· Ask your health care provider when it is okay to: °¨ Return to work or school. °¨ Resume usual physical activities or sports. °¨ Resume sexual activity. °· Do not drive home if you are discharged the same day as the procedure. Have someone else drive you. °· You may drive 24 hours after the procedure unless otherwise  instructed by your health care provider. °· Do not operate machinery or power tools for 24 hours after the procedure. °· If your procedure was done as an outpatient procedure, which means that you went home the same day as your procedure, a responsible adult should be with you for the first 24 hours after you arrive home. °· Keep all follow-up visits as directed by your health care provider. This is important. °SEEK MEDICAL CARE IF: °· You have a fever. °· You have chills. °· You have increased bleeding from the radial site. Hold pressure on the site. °SEEK IMMEDIATE MEDICAL CARE IF: °· You have unusual pain at the radial site. °· You have redness, warmth, or swelling at the radial site. °· You have drainage (other than a small amount of blood on the dressing) from the radial site. °· The radial site is bleeding, and the bleeding does not stop after 30 minutes of holding steady pressure on the site. °· Your arm or hand becomes pale, cool, tingly, or numb. °  °This information is not intended to replace advice given to you by your health care provider. Make sure you discuss any questions you have with your health care provider. °  °Document Released: 05/31/2010 Document Revised: 05/19/2014 Document Reviewed: 11/14/2013 °Elsevier Interactive Patient Education ©2016 [employer] ° °

## 2015-03-30 ENCOUNTER — Encounter: Payer: Self-pay | Admitting: Internal Medicine

## 2015-03-30 ENCOUNTER — Ambulatory Visit (INDEPENDENT_AMBULATORY_CARE_PROVIDER_SITE_OTHER): Payer: Medicare Other | Admitting: Internal Medicine

## 2015-03-30 VITALS — BP 116/74 | HR 56 | Ht 73.0 in | Wt 199.3 lb

## 2015-03-30 DIAGNOSIS — I10 Essential (primary) hypertension: Secondary | ICD-10-CM

## 2015-03-30 DIAGNOSIS — I2583 Coronary atherosclerosis due to lipid rich plaque: Secondary | ICD-10-CM

## 2015-03-30 DIAGNOSIS — I2510 Atherosclerotic heart disease of native coronary artery without angina pectoris: Secondary | ICD-10-CM

## 2015-03-30 DIAGNOSIS — E785 Hyperlipidemia, unspecified: Secondary | ICD-10-CM

## 2015-03-30 DIAGNOSIS — I200 Unstable angina: Secondary | ICD-10-CM | POA: Diagnosis not present

## 2015-03-30 NOTE — Patient Instructions (Signed)
Your physician wants you to follow-up in: 1 year with Dr. Hilty. You will receive a reminder letter in the mail two months in advance. If you don't receive a letter, please call our office to schedule the follow-up appointment.  

## 2015-03-30 NOTE — Progress Notes (Signed)
OFFICE NOTE  Chief Complaint:  Follow-up stress test  Primary Care Physician: Mayra Neer, MD  HPI:  Charles Oconnor  is a 64 year old gentleman with history of hypertension, dyslipidemia and mild to moderate coronary disease. We have been following him for dyslipidemia and he has had liver enzyme abnormalities before on Zocor and was changed to Pravachol with pretty good control. Sometimes he gets chest discomfort; however, it is pretty short lived and possibly related to that. His EKG today shows a sinus bradycardia at 54 and I understand recently he was placed on a beta blocker which caused marked sinus bradycardia and that was promptly discontinued. At this point, although there is possibly an occasion for that given his history of coronary disease, his heart rate being low, he is unable to tolerate a beta blocker. He is on an ACE inhibitor which is pretty good at controlling his blood pressure as well as amlodipine.   Charles Oconnor returns today for followup. He reports doing fairly well. He's recently started doing some walking and has actually lost about 10 pounds in the last 6 months. He says he does feel better and have more energy. A portion is developed some peripheral neuropathy. He's been started on Neurontin by a neurologist in [place]. He seems to be tolerating this medicine although has some occasional lightheadedness and dizziness. He is peripheral nerve pain is better however does have numbness in his feet. He occasionally gets some mild swelling. He also was told that he had high vitamin B6 levels and was told to stop taking his multivitamin.  I the pleasure see Charles Oconnor back in the office today. Overall he is doing fairly well except he is struggling with low back pain and some neuropathic pain in his legs. He's been getting back injections without much improvement. He's also been describing some occasional chest discomfort and progressive fatigue over the past  several months. He feels like he has no energy. There is also complaints of mid back pain between his shoulder blades which occurs on unusual occasions and not associated with his low back pain. Sometimes it's a little worse when exerting himself although he's not been as active due to his back problems. As a reminder he had heart catheterization in 2011 with Dr. Tina Griffiths, which demonstrated mild to moderate coronary artery disease.  He recently had lab work through his primary care provider which looks fairly normal except for an elevated cholesterol profile. His total cholesterol is 166, HL 43, triglycerides 112 and LDL 101. He's currently on pravastatin 80 mg daily and says that he has had problems with myalgias and liver function abnormalities on simvastatin in the past.  Charles Oconnor returns today for follow-up. He recently underwent a stress test which showed the following:   The left ventricular ejection fraction is mildly decreased (45-54%).  Nuclear stress EF: 52%.  No T wave inversion was noted during stress.  There was no ST segment deviation noted during stress.  Defect 1: There is a medium defect of moderate severity.  There is a moderate-sized and intensity, partially reversible inferoseptal perfusion defect which could represent ischemia. LVEF 52% with basal inferoseptal dyskinesis. This is an intermediate risk study.   Based on these findings, I recommended either continued medical therapy or we could consider cardiac catheterization. Subsequently he felt like his chest pain was worsening and ultimately based on his prior catheterization results, we decided to proceed with cardiac catheterization. The results are as follows:   Prox RCA  lesion, 40% stenosed. The lesion was not previously treated.  Prox Cx to Dist Cx lesion, 20% stenosed. The lesion was not previously treated.  Ost 1st Diag to 1st Diag lesion, 40% stenosed. The lesion was not previously treated.  Dist LAD  lesion, 20% stenosed. The lesion was not previously treated.  The left ventricular systolic function is normal.  Mild, non-obstructive CAD - somewhat improved from prior cath in 2011. Normal LV function. False positive nuclear stress test. At this point, Charles Oconnor is doing much better and denies any chest pain or worsening shortness of breath. He gets a small amount of ankle edema which I attribute to amlodipine.  PMHx:  Past Medical History  Diagnosis Date  . Hypertension   . Dyslipidemia   . Coronary artery disease     mild to moderate  . Prostate cancer ([place])   . ED (erectile dysfunction)     Past Surgical History  Procedure Laterality Date  . Cardiac catheterization  2002, 2011    mild-mod CAD  . Appendectomy  1985  . Prostatectomy  03/2007  . Total knee arthroplasty  04/2009  . Cardiac catheterization N/A 03/12/2015    Procedure: Left Heart Cath and Coronary Angiography;  Surgeon: Pixie Casino, MD;  Location: [place] [facility];  Service: Cardiovascular;  Laterality: N/A;    FAMHx:  Family History  Problem Relation Age of Onset  . Heart attack Father   . Diabetes Mother   . Heart failure Mother   . Coronary artery disease Mother   . Stroke Mother   . Prostate cancer Brother   . Heart attack Brother     also CVA  . Heart attack Brother     also CVA  . Cancer Brother     SOCHx:   reports that he has never smoked. He has never used smokeless tobacco. His alcohol and drug histories are not on file.  ALLERGIES:  Allergies  Allergen Reactions  . Zocor [Simvastatin] Other (See Comments)    Elevate liver enzymes    ROS: A comprehensive review of systems was negative except for: Cardiovascular: positive for lower extremity edema  HOME MEDS: Current Outpatient Prescriptions  Medication Sig Dispense Refill  . amLODipine (NORVASC) 10 MG tablet Take 10 mg by mouth daily.    Marland Kitchen aspirin 81 MG tablet Take 81 mg by mouth daily.    Marland Kitchen  lisinopril-hydrochlorothiazide (PRINZIDE,ZESTORETIC) 20-12.5 MG per tablet Take 2 tablets by mouth daily.     . Multiple Vitamins-Minerals (CENTRUM SILVER PO) Take 1 tablet by mouth daily.    . naproxen sodium (ANAPROX) 220 MG tablet Take 440 mg by mouth as needed (for pain).    . nitroGLYCERIN (NITROSTAT) 0.4 MG SL tablet Place 1 tablet (0.4 mg total) under the tongue every 5 (five) minutes as needed for chest pain. 25 tablet 3  . pantoprazole (PROTONIX) 40 MG tablet Take 40 mg by mouth daily.    . pravastatin (PRAVACHOL) 80 MG tablet Take 80 mg by mouth daily.      No current facility-administered medications for this visit.    LABS/IMAGING: No results found for this or any previous visit (from the past 48 hour(s)). No results found.  VITALS: BP 116/74 mmHg  Pulse 56  Ht 6\' 1"  (1.854 m)  Wt 199 lb 4.8 oz (90.402 kg)  BMI 26.30 kg/m2  EXAM: Extremities: Radial calf site is intact without ecchymosis, bruit or hematoma  EKG: Deferred  ASSESSMENT:  Chest and back pain with  progressive fatigue - mild nonobstructive coronary disease by cath (2016), somewhat improved compared to his prior study in 2011  Hypertension-controlled  Dyslipidemia - not at goal LDL of <70  Mild to moderate coronary disease  PLAN: 1.   Charles Oconnor has had resolution of his chest pain symptoms and a [facility] catheterization which was similar not mildly improved compared with study in 2011. He needs to continue to work on aggressive cholesterol and blood pressure control as well as diet and exercise. Overall this should be pretty reassuring. Plan to see him back annually or sooner as necessary.  Pixie Casino, MD, [facility] Attending Cardiologist [place] 03/30/2015, 8:28 AM

## 2015-04-03 DIAGNOSIS — E782 Mixed hyperlipidemia: Secondary | ICD-10-CM | POA: Diagnosis not present

## 2015-05-11 DIAGNOSIS — Z8546 Personal history of malignant neoplasm of prostate: Secondary | ICD-10-CM | POA: Diagnosis not present

## 2015-07-29 ENCOUNTER — Encounter (HOSPITAL_COMMUNITY): Payer: Self-pay | Admitting: Emergency Medicine

## 2015-07-29 ENCOUNTER — Emergency Department (HOSPITAL_COMMUNITY)
Admission: EM | Admit: 2015-07-29 | Discharge: 2015-07-30 | Disposition: A | Discharge status: Home / Self Care | Payer: Medicare Other | Attending: Emergency Medicine | Admitting: Emergency Medicine

## 2015-07-29 DIAGNOSIS — E785 Hyperlipidemia, unspecified: Secondary | ICD-10-CM | POA: Diagnosis not present

## 2015-07-29 DIAGNOSIS — Z87438 Personal history of other diseases of male genital organs: Secondary | ICD-10-CM | POA: Diagnosis not present

## 2015-07-29 DIAGNOSIS — Z7982 Long term (current) use of aspirin: Secondary | ICD-10-CM | POA: Insufficient documentation

## 2015-07-29 DIAGNOSIS — R197 Diarrhea, unspecified: Secondary | ICD-10-CM | POA: Diagnosis not present

## 2015-07-29 DIAGNOSIS — R109 Unspecified abdominal pain: Secondary | ICD-10-CM | POA: Insufficient documentation

## 2015-07-29 DIAGNOSIS — I10 Essential (primary) hypertension: Secondary | ICD-10-CM | POA: Diagnosis not present

## 2015-07-29 DIAGNOSIS — Z8546 Personal history of malignant neoplasm of prostate: Secondary | ICD-10-CM | POA: Diagnosis not present

## 2015-07-29 DIAGNOSIS — R112 Nausea with vomiting, unspecified: Secondary | ICD-10-CM | POA: Insufficient documentation

## 2015-07-29 DIAGNOSIS — I2510 Atherosclerotic heart disease of native coronary artery without angina pectoris: Secondary | ICD-10-CM | POA: Insufficient documentation

## 2015-07-29 DIAGNOSIS — Z79899 Other long term (current) drug therapy: Secondary | ICD-10-CM | POA: Diagnosis not present

## 2015-07-29 DIAGNOSIS — Z9889 Other specified postprocedural states: Secondary | ICD-10-CM | POA: Diagnosis not present

## 2015-07-29 DIAGNOSIS — R000 Tachycardia, unspecified: Secondary | ICD-10-CM | POA: Insufficient documentation

## 2015-07-29 LAB — URINALYSIS, ROUTINE W REFLEX MICROSCOPIC
GLUCOSE, UA: NEGATIVE mg/dL
KETONES UR: NEGATIVE mg/dL
NITRITE: NEGATIVE
PH: 5.5 (ref 5.0–8.0)
Protein, ur: 30 mg/dL — AB
Specific Gravity, Urine: 1.03 (ref 1.005–1.030)

## 2015-07-29 LAB — URINE MICROSCOPIC-ADD ON: SQUAMOUS EPITHELIAL / LPF: NONE SEEN

## 2015-07-29 LAB — COMPREHENSIVE METABOLIC PANEL
ALBUMIN: 5.3 g/dL — AB (ref 3.5–5.0)
ALK PHOS: 73 U/L (ref 38–126)
ALT: 23 U/L (ref 17–63)
AST: 26 U/L (ref 15–41)
Anion gap: 13 (ref 5–15)
BILIRUBIN TOTAL: 1.7 mg/dL — AB (ref 0.3–1.2)
BUN: 26 mg/dL — AB (ref 6–20)
CO2: 24 mmol/L (ref 22–32)
Calcium: 10.5 mg/dL — ABNORMAL HIGH (ref 8.9–10.3)
Chloride: 102 mmol/L (ref 101–111)
Creatinine, Ser: 1.3 mg/dL — ABNORMAL HIGH (ref 0.61–1.24)
GFR calc Af Amer: >60 mL/min (ref >60)
GFR calc non Af Amer: 56 mL/min — ABNORMAL LOW (ref >60)
GLUCOSE: 158 mg/dL — AB (ref 65–99)
POTASSIUM: 3.9 mmol/L (ref 3.5–5.1)
Sodium: 139 mmol/L (ref 135–145)
TOTAL PROTEIN: 8.7 g/dL — AB (ref 6.5–8.1)

## 2015-07-29 LAB — I-STAT CG4 LACTIC ACID, ED: Lactic Acid, Venous: 1.56 mmol/L (ref 0.5–2.0)

## 2015-07-29 LAB — CBC
HEMATOCRIT: 46.2 % (ref 39.0–52.0)
Hemoglobin: 16.2 g/dL (ref 13.0–17.0)
MCH: 29.8 pg (ref 26.0–34.0)
MCHC: 35.1 g/dL (ref 30.0–36.0)
MCV: 84.9 fL (ref 78.0–100.0)
Platelets: 233 10*3/uL (ref 150–400)
RBC: 5.44 MIL/uL (ref 4.22–5.81)
RDW: 13.4 % (ref 11.5–15.5)
WBC: 13.2 10*3/uL — ABNORMAL HIGH (ref 4.0–10.5)

## 2015-07-29 LAB — LIPASE, BLOOD: Lipase: 30 U/L (ref 11–51)

## 2015-07-29 MED ORDER — ONDANSETRON HCL 4 MG/2ML IJ SOLN
4.0000 mg | Freq: Once | INTRAMUSCULAR | Status: AC | PRN
  Administered 2015-07-29: 4 mg via INTRAVENOUS
  Filled 2015-07-29: qty 2

## 2015-07-29 MED ORDER — SODIUM CHLORIDE 0.9 % IV BOLUS (SEPSIS)
1000.0000 mL | Freq: Once | INTRAVENOUS | Status: AC
  Administered 2015-07-29: 1000 mL via INTRAVENOUS

## 2015-07-29 NOTE — ED Provider Notes (Signed)
CSN: [id number]     Arrival date & time 07/29/15  1842 History   First MD Initiated Contact with Patient 07/29/15 2023     Chief Complaint  Patient presents with  . Diarrhea  . Emesis     (Consider location/radiation/quality/duration/timing/severity/associated sxs/prior Treatment) HPI Comments: Patient presents to the emergency department with chief complaint of diarrhea. He states that he has had diarrhea since 4 AM this morning. He has had too many episodes to count. He reports to associated episodes of vomiting. Denies seeing any blood in his vomit or stool. He states that it has been watery. He denies any fevers or chills. He states that he ate some undercooked chicken last night. He denies taking anything for his symptoms. There are no modifying factors. He reports some associated crampy abdominal pain.  The history is provided by the patient. No language interpreter was used.    Past Medical History  Diagnosis Date  . Hypertension   . Dyslipidemia   . Coronary artery disease     mild to moderate  . Prostate cancer ([place])   . ED (erectile dysfunction)    Past Surgical History  Procedure Laterality Date  . Cardiac catheterization  2002, 2011    mild-mod CAD  . Appendectomy  1985  . Prostatectomy  03/2007  . Total knee arthroplasty  04/2009  . Cardiac catheterization N/A 03/12/2015    Procedure: Left Heart Cath and Coronary Angiography;  Surgeon: Pixie Casino, MD;  Location: [place] [facility];  Service: Cardiovascular;  Laterality: N/A;  . Hand surgery Left     had joints fused   Family History  Problem Relation Age of Onset  . Heart attack Father   . Diabetes Mother   . Heart failure Mother   . Coronary artery disease Mother   . Stroke Mother   . Prostate cancer Brother   . Heart attack Brother     also CVA  . Heart attack Brother     also CVA  . Cancer Brother    Social History  Substance Use Topics  . Smoking status: Never Smoker   . Smokeless tobacco:  Never Used  . Alcohol Use: No    Review of Systems  Constitutional: Negative for fever and chills.  Respiratory: Negative for shortness of breath.   Cardiovascular: Negative for chest pain.  Gastrointestinal: Positive for vomiting and diarrhea. Negative for nausea and constipation.  Genitourinary: Negative for dysuria.  All other systems reviewed and are negative.     Allergies  Zocor  Home Medications   Prior to Admission medications   Medication Sig Start Date End Date Taking? Authorizing Provider  amLODipine (NORVASC) 10 MG tablet Take 10 mg by mouth daily.   Yes Historical Provider, MD  aspirin 81 MG tablet Take 81 mg by mouth daily.   Yes Historical Provider, MD  lisinopril-hydrochlorothiazide (PRINZIDE,ZESTORETIC) 20-12.5 MG per tablet Take 2 tablets by mouth daily.    Yes Historical Provider, MD  Multiple Vitamins-Minerals (CENTRUM SILVER PO) Take 1 tablet by mouth daily.   Yes Historical Provider, MD  naproxen sodium (ANAPROX) 220 MG tablet Take 440 mg by mouth as needed (for pain).   Yes Historical Provider, MD  nitroGLYCERIN (NITROSTAT) 0.4 MG SL tablet Place 1 tablet (0.4 mg total) under the tongue every 5 (five) minutes as needed for chest pain. 03/02/15  Yes Pixie Casino, MD  pantoprazole (PROTONIX) 40 MG tablet Take 40 mg by mouth daily.   Yes Historical Provider, MD  pravastatin (PRAVACHOL) 80 MG tablet Take 80 mg by mouth daily.  12/22/14  Yes Historical Provider, MD   BP 113/97 mmHg  Pulse 107  Temp(Src) 99.1 F (37.3 C) (Oral)  Resp 20  SpO2 97% Physical Exam  Constitutional: He is oriented to person, place, and time. He appears well-developed and well-nourished.  HENT:  Head: Normocephalic and atraumatic.  Eyes: Conjunctivae and EOM are normal. Pupils are equal, round, and reactive to light. Right eye exhibits no discharge. Left eye exhibits no discharge. No scleral icterus.  Neck: Normal range of motion. Neck supple. No JVD present.  Cardiovascular:  Regular rhythm and normal heart sounds.  Exam reveals no gallop and no friction rub.   No murmur heard. Mildly tachycardic  Pulmonary/Chest: Effort normal and breath sounds normal. No respiratory distress. He has no wheezes. He has no rales. He exhibits no tenderness.  Abdominal: Soft. He exhibits no distension and no mass. There is no tenderness. There is no rebound and no guarding.  No focal abdominal tenderness, no RLQ tenderness or pain at McBurney's point, no RUQ tenderness or Murphy's sign, no left-sided abdominal tenderness, no fluid wave, or signs of peritonitis   Musculoskeletal: Normal range of motion. He exhibits no edema or tenderness.  Neurological: He is alert and oriented to person, place, and time.  Skin: Skin is warm and dry.  Psychiatric: He has a normal mood and affect. His behavior is normal. Judgment and thought content normal.  Nursing note and vitals reviewed.   ED Course  Procedures (including critical care time) Results for orders placed or performed during the hospital encounter of 07/29/15  Lipase, blood  Result Value Ref Range   Lipase 30 11 - 51 U/L  Comprehensive metabolic panel  Result Value Ref Range   Sodium 139 135 - 145 mmol/L   Potassium 3.9 3.5 - 5.1 mmol/L   Chloride 102 101 - 111 mmol/L   CO2 24 22 - 32 mmol/L   Glucose, Bld 158 (H) 65 - 99 mg/dL   BUN 26 (H) 6 - 20 mg/dL   Creatinine, Ser 1.30 (H) 0.61 - 1.24 mg/dL   Calcium 10.5 (H) 8.9 - 10.3 mg/dL   Total Protein 8.7 (H) 6.5 - 8.1 g/dL   Albumin 5.3 (H) 3.5 - 5.0 g/dL   AST 26 15 - 41 U/L   ALT 23 17 - 63 U/L   Alkaline Phosphatase 73 38 - 126 U/L   Total Bilirubin 1.7 (H) 0.3 - 1.2 mg/dL   GFR calc non Af Amer 56 (L) >60 mL/min   GFR calc Af Amer >60 >60 mL/min   Anion gap 13 5 - 15  CBC  Result Value Ref Range   WBC 13.2 (H) 4.0 - 10.5 K/uL   RBC 5.44 4.22 - 5.81 MIL/uL   Hemoglobin 16.2 13.0 - 17.0 g/dL   HCT 46.2 39.0 - 52.0 %   MCV 84.9 78.0 - 100.0 fL   MCH 29.8 26.0 -  34.0 pg   MCHC 35.1 30.0 - 36.0 g/dL   RDW 13.4 11.5 - 15.5 %   Platelets 233 150 - 400 K/uL  Urinalysis, Routine w reflex microscopic (not at [facility])  Result Value Ref Range   Color, Urine ORANGE (A) YELLOW   APPearance CLOUDY (A) CLEAR   Specific Gravity, Urine 1.030 1.005 - 1.030   pH 5.5 5.0 - 8.0   Glucose, UA NEGATIVE NEGATIVE mg/dL   Hgb urine dipstick TRACE (A) NEGATIVE   Bilirubin Urine SMALL (  A) NEGATIVE   Ketones, ur NEGATIVE NEGATIVE mg/dL   Protein, ur 30 (A) NEGATIVE mg/dL   Nitrite NEGATIVE NEGATIVE   Leukocytes, UA TRACE (A) NEGATIVE  Urine microscopic-add on  Result Value Ref Range   Squamous Epithelial / LPF NONE SEEN NONE SEEN   WBC, UA 0-5 0 - 5 WBC/hpf   RBC / HPF 0-5 0 - 5 RBC/hpf   Bacteria, UA RARE (A) NONE SEEN   Casts HYALINE CASTS (A) NEGATIVE   Urine-Other MUCOUS PRESENT   I-Stat CG4 Lactic Acid, ED  Result Value Ref Range   Lactic Acid, Venous 1.56 0.5 - 2.0 mmol/L   No results found.  I have personally reviewed and evaluated these images and lab results as part of my medical decision-making.    MDM   Final diagnoses:  Non-intractable vomiting with nausea, vomiting of unspecified type  Diarrhea, unspecified type    Patient with diarrhea that started this morning. Will check labs, will give fluids, will reassess.  Mild bump in creatinine to 1.3, electrolytes are normal, CBC shows mild leukocytosis, nonfocal. Abdominal exam is nontender. Doubt acute abdomen or surgical process. Patient is feeling well. Discussed with Dr. Roderic Palau, who recommends discharge. Strict return precautions given. Patient understands and agrees with plan. He is stable and ready for discharge.  Patient instructed to return for:  New or worsening symptoms, including, increased abdominal pain, especially pain that localizes to one side, bloody vomit, bloody diarrhea, fever >101, and intractable vomiting.  Filed Vitals:   07/29/15 2301 07/30/15 0019  BP: 125/74 132/73   Pulse: 90 71  Temp:  99 F (37.2 C)  Resp: 16 [address], [place] 07/30/15 0029  Milton Ferguson, MD 08/01/15 2013

## 2015-07-29 NOTE — ED Notes (Signed)
Patient reports eating undercooked chicken last night. Emesis x2, diarrhea too many to count. Patient is tachycardic in triage.

## 2015-07-30 MED ORDER — ONDANSETRON HCL 4 MG PO TABS: 4.0000 mg | ORAL_TABLET | Freq: Four times a day (QID) | ORAL | Status: DC

## 2015-07-30 NOTE — Discharge Instructions (Signed)

## 2015-08-03 DIAGNOSIS — M79644 Pain in right finger(s): Secondary | ICD-10-CM | POA: Diagnosis not present

## 2015-08-03 DIAGNOSIS — M1811 Unilateral primary osteoarthritis of first carpometacarpal joint, right hand: Secondary | ICD-10-CM | POA: Diagnosis not present

## 2015-08-17 DIAGNOSIS — D692 Other nonthrombocytopenic purpura: Secondary | ICD-10-CM | POA: Diagnosis not present

## 2015-08-17 DIAGNOSIS — D2272 Melanocytic nevi of left lower limb, including hip: Secondary | ICD-10-CM | POA: Diagnosis not present

## 2015-08-17 DIAGNOSIS — L814 Other melanin hyperpigmentation: Secondary | ICD-10-CM | POA: Diagnosis not present

## 2015-08-17 DIAGNOSIS — D1801 Hemangioma of skin and subcutaneous tissue: Secondary | ICD-10-CM | POA: Diagnosis not present

## 2015-08-17 DIAGNOSIS — D2271 Melanocytic nevi of right lower limb, including hip: Secondary | ICD-10-CM | POA: Diagnosis not present

## 2015-08-17 DIAGNOSIS — D225 Melanocytic nevi of trunk: Secondary | ICD-10-CM | POA: Diagnosis not present

## 2015-08-17 DIAGNOSIS — Z85828 Personal history of other malignant neoplasm of skin: Secondary | ICD-10-CM | POA: Diagnosis not present

## 2015-08-17 DIAGNOSIS — L821 Other seborrheic keratosis: Secondary | ICD-10-CM | POA: Diagnosis not present

## 2016-01-18 DIAGNOSIS — Z23 Encounter for immunization: Secondary | ICD-10-CM | POA: Diagnosis not present

## 2016-03-12 DIAGNOSIS — E782 Mixed hyperlipidemia: Secondary | ICD-10-CM | POA: Diagnosis not present

## 2016-03-12 DIAGNOSIS — R5383 Other fatigue: Secondary | ICD-10-CM | POA: Diagnosis not present

## 2016-03-12 DIAGNOSIS — N529 Male erectile dysfunction, unspecified: Secondary | ICD-10-CM | POA: Diagnosis not present

## 2016-03-12 DIAGNOSIS — C61 Malignant neoplasm of prostate: Secondary | ICD-10-CM | POA: Diagnosis not present

## 2016-03-12 DIAGNOSIS — Z23 Encounter for immunization: Secondary | ICD-10-CM | POA: Diagnosis not present

## 2016-03-12 DIAGNOSIS — Z1211 Encounter for screening for malignant neoplasm of colon: Secondary | ICD-10-CM | POA: Diagnosis not present

## 2016-03-12 DIAGNOSIS — F411 Generalized anxiety disorder: Secondary | ICD-10-CM | POA: Diagnosis not present

## 2016-03-12 DIAGNOSIS — I119 Hypertensive heart disease without heart failure: Secondary | ICD-10-CM | POA: Diagnosis not present

## 2016-03-12 DIAGNOSIS — I2510 Atherosclerotic heart disease of native coronary artery without angina pectoris: Secondary | ICD-10-CM | POA: Diagnosis not present

## 2016-03-12 DIAGNOSIS — Z0000 Encounter for general adult medical examination without abnormal findings: Secondary | ICD-10-CM | POA: Diagnosis not present

## 2016-03-12 DIAGNOSIS — G629 Polyneuropathy, unspecified: Secondary | ICD-10-CM | POA: Diagnosis not present

## 2016-03-12 DIAGNOSIS — M549 Dorsalgia, unspecified: Secondary | ICD-10-CM | POA: Diagnosis not present

## 2016-03-20 DIAGNOSIS — Z1211 Encounter for screening for malignant neoplasm of colon: Secondary | ICD-10-CM | POA: Diagnosis not present

## 2016-03-28 DIAGNOSIS — M545 Low back pain: Secondary | ICD-10-CM | POA: Diagnosis not present

## 2016-04-01 ENCOUNTER — Encounter: Payer: Self-pay | Admitting: Internal Medicine

## 2016-04-01 ENCOUNTER — Ambulatory Visit (INDEPENDENT_AMBULATORY_CARE_PROVIDER_SITE_OTHER): Payer: Medicare Other | Admitting: Internal Medicine

## 2016-04-01 VITALS — BP 130/68 | HR 67 | Ht 73.0 in | Wt 196.0 lb

## 2016-04-01 DIAGNOSIS — M545 Low back pain: Secondary | ICD-10-CM | POA: Diagnosis not present

## 2016-04-01 DIAGNOSIS — I2510 Atherosclerotic heart disease of native coronary artery without angina pectoris: Secondary | ICD-10-CM

## 2016-04-01 DIAGNOSIS — E785 Hyperlipidemia, unspecified: Secondary | ICD-10-CM

## 2016-04-01 DIAGNOSIS — I10 Essential (primary) hypertension: Secondary | ICD-10-CM

## 2016-04-01 DIAGNOSIS — J069 Acute upper respiratory infection, unspecified: Secondary | ICD-10-CM | POA: Diagnosis not present

## 2016-04-01 DIAGNOSIS — I2583 Coronary atherosclerosis due to lipid rich plaque: Secondary | ICD-10-CM | POA: Diagnosis not present

## 2016-04-01 DIAGNOSIS — B9789 Other viral agents as the cause of diseases classified elsewhere: Secondary | ICD-10-CM

## 2016-04-01 NOTE — Patient Instructions (Signed)
Medication Instructions:  You can take Delsym or Robitussin for your cough and Mucinex for congestion all of these meds are over the counter  Labwork: None   Testing/Procedures: None   Follow-Up: Your physician wants you to follow-up in: 12 months with DR HILTY. You will receive a reminder letter in the mail two months in advance. If you don't receive a letter, please call our office to schedule the follow-up appointment.  Any Other Special Instructions Will Be Listed Below (If Applicable).     If you need a refill on your cardiac medications before your next appointment, please call your pharmacy.

## 2016-04-01 NOTE — Progress Notes (Signed)
OFFICE NOTE  Chief Complaint:  Cough, sore throat  Primary Care Physician: Mayra Neer, MD  HPI:  Charles Oconnor  is a 65 year old gentleman with history of hypertension, dyslipidemia and mild to moderate coronary disease. We have been following him for dyslipidemia and he has had liver enzyme abnormalities before on Zocor and was changed to Pravachol with pretty good control. Sometimes he gets chest discomfort; however, it is pretty short lived and possibly related to that. His EKG today shows a sinus bradycardia at 54 and I understand recently he was placed on a beta blocker which caused marked sinus bradycardia and that was promptly discontinued. At this point, although there is possibly an occasion for that given his history of coronary disease, his heart rate being low, he is unable to tolerate a beta blocker. He is on an ACE inhibitor which is pretty good at controlling his blood pressure as well as amlodipine.   Charles Oconnor returns today for followup. He reports doing fairly well. He's recently started doing some walking and has actually lost about 10 pounds in the last 6 months. He says he does feel better and have more energy. A portion is developed some peripheral neuropathy. He's been started on Neurontin by a neurologist in [place]. He seems to be tolerating this medicine although has some occasional lightheadedness and dizziness. He is peripheral nerve pain is better however does have numbness in his feet. He occasionally gets some mild swelling. He also was told that he had high vitamin B6 levels and was told to stop taking his multivitamin.  I the pleasure see Charles Oconnor back in the office today. Overall he is doing fairly well except he is struggling with low back pain and some neuropathic pain in his legs. He's been getting back injections without much improvement. He's also been describing some occasional chest discomfort and progressive fatigue over the past several  months. He feels like he has no energy. There is also complaints of mid back pain between his shoulder blades which occurs on unusual occasions and not associated with his low back pain. Sometimes it's a little worse when exerting himself although he's not been as active due to his back problems. As a reminder he had heart catheterization in 2011 with Dr. Tina Griffiths, which demonstrated mild to moderate coronary artery disease.  He recently had lab work through his primary care provider which looks fairly normal except for an elevated cholesterol profile. His total cholesterol is 166, HL 43, triglycerides 112 and LDL 101. He's currently on pravastatin 80 mg daily and says that he has had problems with myalgias and liver function abnormalities on simvastatin in the past.  Charles Oconnor returns today for follow-up. He recently underwent a stress test which showed the following:   The left ventricular ejection fraction is mildly decreased (45-54%).  Nuclear stress EF: 52%.  No T wave inversion was noted during stress.  There was no ST segment deviation noted during stress.  Defect 1: There is a medium defect of moderate severity.  There is a moderate-sized and intensity, partially reversible inferoseptal perfusion defect which could represent ischemia. LVEF 52% with basal inferoseptal dyskinesis. This is an intermediate risk study.   Based on these findings, I recommended either continued medical therapy or we could consider cardiac catheterization. Subsequently he felt like his chest pain was worsening and ultimately based on his prior catheterization results, we decided to proceed with cardiac catheterization. The results are as follows:   Prox RCA  lesion, 40% stenosed. The lesion was not previously treated.  Prox Cx to Dist Cx lesion, 20% stenosed. The lesion was not previously treated.  Ost 1st Diag to 1st Diag lesion, 40% stenosed. The lesion was not previously treated.  Dist LAD lesion, 20%  stenosed. The lesion was not previously treated.  The left ventricular systolic function is normal.  Mild, non-obstructive CAD - somewhat improved from prior cath in 2011. Normal LV function. False positive nuclear stress test. At this point, Charles Oconnor is doing much better and denies any chest pain or worsening shortness of breath. He gets a small amount of ankle edema which I attribute to amlodipine.  04/01/2016  Charles Oconnor returns today for follow-up. He denies any chest pain or worsening shortness of breath. He apparently had a sore throat yesterday and has some drainage today. He is interested in some over-the-counter medications for that. He's had no chest pain that sounds anginal. Blood pressure well controlled. Recent cholesterol check through his primary care provider shows good control with LDL less than 100.  PMHx:  Past Medical History:  Diagnosis Date  . Coronary artery disease    mild to moderate  . Dyslipidemia   . ED (erectile dysfunction)   . Hypertension   . Prostate cancer [facility])     Past Surgical History:  Procedure Laterality Date  . APPENDECTOMY  1985  . CARDIAC CATHETERIZATION  2002, 2011   mild-mod CAD  . CARDIAC CATHETERIZATION N/A 03/12/2015   Procedure: Left Heart Cath and Coronary Angiography;  Surgeon: Pixie Casino, MD;  Location: [place] [facility];  Service: Cardiovascular;  Laterality: N/A;  . HAND SURGERY Left    had joints fused  . PROSTATECTOMY  03/2007  . TOTAL KNEE ARTHROPLASTY  04/2009    FAMHx:  Family History  Problem Relation Age of Onset  . Heart attack Father   . Diabetes Mother   . Heart failure Mother   . Coronary artery disease Mother   . Stroke Mother   . Prostate cancer Brother   . Heart attack Brother     also CVA  . Heart attack Brother     also CVA  . Cancer Brother     SOCHx:   reports that he has never smoked. He has never used smokeless tobacco. He reports that he does not drink alcohol or use  drugs.  ALLERGIES:  Allergies  Allergen Reactions  . Zocor [Simvastatin] Other (See Comments)    Elevate liver enzymes    ROS: A comprehensive review of systems was negative except for: Cardiovascular: positive for lower extremity edema  HOME MEDS: Current Outpatient Prescriptions  Medication Sig Dispense Refill  . amLODipine (NORVASC) 10 MG tablet Take 10 mg by mouth daily.    Marland Kitchen aspirin 81 MG tablet Take 81 mg by mouth daily.    Marland Kitchen lisinopril-hydrochlorothiazide (PRINZIDE,ZESTORETIC) 20-12.5 MG per tablet Take 2 tablets by mouth daily.     . Multiple Vitamins-Minerals (CENTRUM SILVER PO) Take 1 tablet by mouth daily.    . naproxen sodium (ANAPROX) 220 MG tablet Take 440 mg by mouth as needed (for pain).    . nitroGLYCERIN (NITROSTAT) 0.4 MG SL tablet Place 1 tablet (0.4 mg total) under the tongue every 5 (five) minutes as needed for chest pain. 25 tablet 3  . pantoprazole (PROTONIX) 40 MG tablet Take 40 mg by mouth daily.    . pravastatin (PRAVACHOL) 80 MG tablet Take 80 mg by mouth daily.  No current facility-administered medications for this visit.     LABS/IMAGING: No results found for this or any previous visit (from the past 48 hour(s)). No results found.  VITALS: BP 130/68   Pulse 67   Ht 6\' 1"  (1.854 m)   Wt 196 lb (88.9 kg)   BMI 25.86 kg/m   EXAM: General appearance: alert and no distress Lungs: clear to auscultation bilaterally Heart: regular rate and rhythm, S1, S2 normal, no murmur, click, rub or gallop Extremities: extremities normal, atraumatic, no cyanosis or edema Skin: Skin color, texture, turgor normal. No rashes or lesions Neurologic: Grossly normal  EKG: Normal sinus rhythm, non-specific T wave changes, rate 67  ASSESSMENT: 1. Viral URI 2. Chest and back pain with progressive fatigue - mild nonobstructive coronary disease by cath (2016), somewhat improved compared to his prior study in 2011 3. Hypertension-controlled 4. Dyslipidemia -  controlled 5. Mild to moderate coronary disease  PLAN: 1.   Charles Oconnor denies any worsening chest pain or worsening shortness of breath. He does have some sore throat and runny nose consistent with viral URI. I offered over-the-counter medications including Robitussin or Delsym for cough and Mucinex for congestion. Blood pressure is at goal. Cholesterol is now pretty well controlled with LDL of 83.  Follow-up annually or sooner as necessary.  Pixie Casino, MD, [facility] Attending Cardiologist [place] 04/01/2016, 3:10 PM

## 2016-04-07 DIAGNOSIS — M545 Low back pain: Secondary | ICD-10-CM | POA: Diagnosis not present

## 2016-04-10 DIAGNOSIS — M545 Low back pain: Secondary | ICD-10-CM | POA: Diagnosis not present

## 2016-04-15 DIAGNOSIS — M545 Low back pain: Secondary | ICD-10-CM | POA: Diagnosis not present

## 2016-04-17 DIAGNOSIS — M545 Low back pain: Secondary | ICD-10-CM | POA: Diagnosis not present

## 2016-04-22 DIAGNOSIS — M545 Low back pain: Secondary | ICD-10-CM | POA: Diagnosis not present

## 2016-04-24 DIAGNOSIS — M545 Low back pain: Secondary | ICD-10-CM | POA: Diagnosis not present

## 2016-04-30 DIAGNOSIS — M545 Low back pain: Secondary | ICD-10-CM | POA: Diagnosis not present

## 2016-05-07 DIAGNOSIS — M545 Low back pain: Secondary | ICD-10-CM | POA: Diagnosis not present

## 2016-05-13 DIAGNOSIS — M545 Low back pain: Secondary | ICD-10-CM | POA: Diagnosis not present

## 2016-05-16 DIAGNOSIS — M545 Low back pain: Secondary | ICD-10-CM | POA: Diagnosis not present

## 2016-05-22 DIAGNOSIS — M545 Low back pain: Secondary | ICD-10-CM | POA: Diagnosis not present

## 2016-05-29 DIAGNOSIS — M545 Low back pain: Secondary | ICD-10-CM | POA: Diagnosis not present

## 2016-06-05 DIAGNOSIS — M545 Low back pain: Secondary | ICD-10-CM | POA: Diagnosis not present

## 2016-06-09 DIAGNOSIS — Z8546 Personal history of malignant neoplasm of prostate: Secondary | ICD-10-CM | POA: Diagnosis not present

## 2016-06-09 DIAGNOSIS — N5201 Erectile dysfunction due to arterial insufficiency: Secondary | ICD-10-CM | POA: Diagnosis not present

## 2016-06-10 DIAGNOSIS — M545 Low back pain: Secondary | ICD-10-CM | POA: Diagnosis not present

## 2016-06-13 DIAGNOSIS — M545 Low back pain: Secondary | ICD-10-CM | POA: Diagnosis not present

## 2016-06-17 DIAGNOSIS — M545 Low back pain: Secondary | ICD-10-CM | POA: Diagnosis not present

## 2016-06-20 DIAGNOSIS — M545 Low back pain: Secondary | ICD-10-CM | POA: Diagnosis not present

## 2016-07-31 DIAGNOSIS — R21 Rash and other nonspecific skin eruption: Secondary | ICD-10-CM | POA: Diagnosis not present

## 2016-07-31 DIAGNOSIS — J069 Acute upper respiratory infection, unspecified: Secondary | ICD-10-CM | POA: Diagnosis not present

## 2016-08-22 DIAGNOSIS — L570 Actinic keratosis: Secondary | ICD-10-CM | POA: Diagnosis not present

## 2016-08-22 DIAGNOSIS — L308 Other specified dermatitis: Secondary | ICD-10-CM | POA: Diagnosis not present

## 2016-08-22 DIAGNOSIS — L918 Other hypertrophic disorders of the skin: Secondary | ICD-10-CM | POA: Diagnosis not present

## 2016-08-22 DIAGNOSIS — D225 Melanocytic nevi of trunk: Secondary | ICD-10-CM | POA: Diagnosis not present

## 2016-08-22 DIAGNOSIS — Z85828 Personal history of other malignant neoplasm of skin: Secondary | ICD-10-CM | POA: Diagnosis not present

## 2016-08-22 DIAGNOSIS — L821 Other seborrheic keratosis: Secondary | ICD-10-CM | POA: Diagnosis not present

## 2016-08-22 DIAGNOSIS — L720 Epidermal cyst: Secondary | ICD-10-CM | POA: Diagnosis not present

## 2016-08-30 ENCOUNTER — Emergency Department (HOSPITAL_COMMUNITY): Payer: Medicare Other

## 2016-08-30 ENCOUNTER — Encounter (HOSPITAL_COMMUNITY): Payer: Self-pay | Admitting: Emergency Medicine

## 2016-08-30 ENCOUNTER — Emergency Department (HOSPITAL_COMMUNITY)
Admission: EM | Admit: 2016-08-30 | Discharge: 2016-08-30 | Disposition: A | Discharge status: Home / Self Care | Payer: Medicare Other | Attending: Emergency Medicine | Admitting: Emergency Medicine

## 2016-08-30 DIAGNOSIS — I2510 Atherosclerotic heart disease of native coronary artery without angina pectoris: Secondary | ICD-10-CM | POA: Insufficient documentation

## 2016-08-30 DIAGNOSIS — Z79899 Other long term (current) drug therapy: Secondary | ICD-10-CM | POA: Diagnosis not present

## 2016-08-30 DIAGNOSIS — Z8546 Personal history of malignant neoplasm of prostate: Secondary | ICD-10-CM | POA: Diagnosis not present

## 2016-08-30 DIAGNOSIS — I10 Essential (primary) hypertension: Secondary | ICD-10-CM | POA: Diagnosis not present

## 2016-08-30 DIAGNOSIS — K529 Noninfective gastroenteritis and colitis, unspecified: Secondary | ICD-10-CM | POA: Diagnosis not present

## 2016-08-30 DIAGNOSIS — R1110 Vomiting, unspecified: Secondary | ICD-10-CM | POA: Diagnosis present

## 2016-08-30 DIAGNOSIS — Z7982 Long term (current) use of aspirin: Secondary | ICD-10-CM | POA: Insufficient documentation

## 2016-08-30 DIAGNOSIS — R42 Dizziness and giddiness: Secondary | ICD-10-CM | POA: Diagnosis not present

## 2016-08-30 DIAGNOSIS — R112 Nausea with vomiting, unspecified: Secondary | ICD-10-CM | POA: Diagnosis not present

## 2016-08-30 LAB — COMPREHENSIVE METABOLIC PANEL
ALK PHOS: 66 U/L (ref 38–126)
ALT: 23 U/L (ref 17–63)
AST: 27 U/L (ref 15–41)
Albumin: 4.8 g/dL (ref 3.5–5.0)
Anion gap: 9 (ref 5–15)
BUN: 29 mg/dL — ABNORMAL HIGH (ref 6–20)
CALCIUM: 9.8 mg/dL (ref 8.9–10.3)
CHLORIDE: 105 mmol/L (ref 101–111)
CO2: 28 mmol/L (ref 22–32)
CREATININE: 1.2 mg/dL (ref 0.61–1.24)
Glucose, Bld: 175 mg/dL — ABNORMAL HIGH (ref 65–99)
Potassium: 3.9 mmol/L (ref 3.5–5.1)
SODIUM: 142 mmol/L (ref 135–145)
Total Bilirubin: 1.7 mg/dL — ABNORMAL HIGH (ref 0.3–1.2)
Total Protein: 8 g/dL (ref 6.5–8.1)

## 2016-08-30 LAB — CBC
HCT: 44.8 % (ref 39.0–52.0)
Hemoglobin: 15.5 g/dL (ref 13.0–17.0)
MCH: 30.6 pg (ref 26.0–34.0)
MCHC: 34.6 g/dL (ref 30.0–36.0)
MCV: 88.4 fL (ref 78.0–100.0)
PLATELETS: 195 10*3/uL (ref 150–400)
RBC: 5.07 MIL/uL (ref 4.22–5.81)
RDW: 13.3 % (ref 11.5–15.5)
WBC: 9.5 10*3/uL (ref 4.0–10.5)

## 2016-08-30 LAB — URINALYSIS, ROUTINE W REFLEX MICROSCOPIC
Bilirubin Urine: NEGATIVE
Glucose, UA: NEGATIVE mg/dL
HGB URINE DIPSTICK: NEGATIVE
Ketones, ur: 5 mg/dL — AB
LEUKOCYTES UA: NEGATIVE
NITRITE: NEGATIVE
Protein, ur: NEGATIVE mg/dL
SPECIFIC GRAVITY, URINE: 1.025 (ref 1.005–1.030)
pH: 5 (ref 5.0–8.0)

## 2016-08-30 LAB — I-STAT TROPONIN, ED: TROPONIN I, POC: 0 ng/mL (ref 0.00–0.08)

## 2016-08-30 LAB — LIPASE, BLOOD: LIPASE: 21 U/L (ref 11–51)

## 2016-08-30 MED ORDER — ONDANSETRON 8 MG PO TBDP: ORAL_TABLET | ORAL | 0 refills | Status: DC

## 2016-08-30 MED ORDER — KETOROLAC TROMETHAMINE 30 MG/ML IJ SOLN
30.0000 mg | Freq: Once | INTRAMUSCULAR | Status: AC
  Administered 2016-08-30: 30 mg via INTRAVENOUS
  Filled 2016-08-30: qty 1

## 2016-08-30 MED ORDER — SODIUM CHLORIDE 0.9 % IV BOLUS (SEPSIS)
1000.0000 mL | Freq: Once | INTRAVENOUS | Status: AC
  Administered 2016-08-30: 1000 mL via INTRAVENOUS

## 2016-08-30 MED ORDER — ONDANSETRON HCL 4 MG/2ML IJ SOLN
4.0000 mg | Freq: Once | INTRAMUSCULAR | Status: AC
  Administered 2016-08-30: 4 mg via INTRAVENOUS
  Filled 2016-08-30: qty 2

## 2016-08-30 NOTE — ED Triage Notes (Signed)
Pt reports he began to have n/v/d and generalized abd pain since last night at 10pm. Tolerating PO fluids this am. Had CP at one point last night.

## 2016-08-30 NOTE — ED Provider Notes (Signed)
[facility] DEPT Provider Note   CSN: [id number] Arrival date & time: 08/30/16  1005     History   Chief Complaint Chief Complaint  Patient presents with  . Emesis  . Abdominal Pain  . Chest Pain    HPI Charles Oconnor is a 66 y.o. male.  Patient is a 66 year old male with history of coronary artery disease, hypertension, prior appendectomy. He presents for evaluation of abdominal cramping, nausea, vomiting, and diarrhea that started yesterday evening. He denies any fevers or chills. He denies any bloody stools. He denies any urinary difficulties. He denies any sick contacts or having consumed any suspicious foods.   The history is provided by the patient.  Emesis   This is a new problem. The current episode started yesterday. The problem occurs continuously. The problem has been gradually worsening. The emesis has an appearance of stomach contents. There has been no fever.    Past Medical History:  Diagnosis Date  . Coronary artery disease    mild to moderate  . Dyslipidemia   . ED (erectile dysfunction)   . Hypertension   . Prostate cancer [facility])     Patient Active Problem List   Diagnosis Date Noted  . Viral URI with cough 04/01/2016  . Chest pain, unspecified   . HTN (hypertension) 01/13/2013  . Dyslipidemia 01/13/2013  . CAD (coronary artery disease) 01/13/2013    Past Surgical History:  Procedure Laterality Date  . APPENDECTOMY  1985  . CARDIAC CATHETERIZATION  2002, 2011   mild-mod CAD  . CARDIAC CATHETERIZATION N/A 03/12/2015   Procedure: Left Heart Cath and Coronary Angiography;  Surgeon: Pixie Casino, MD;  Location: [place] [facility];  Service: Cardiovascular;  Laterality: N/A;  . HAND SURGERY Left    had joints fused  . PROSTATECTOMY  03/2007  . TOTAL KNEE ARTHROPLASTY  04/2009       Home Medications    Prior to Admission medications   Medication Sig Start Date End Date Taking? Authorizing Provider  amLODipine (NORVASC) 10 MG tablet  Take 10 mg by mouth daily.   Yes Historical Provider, MD  aspirin EC 81 MG tablet Take 81 mg by mouth daily.   Yes Historical Provider, MD  lisinopril-hydrochlorothiazide (PRINZIDE,ZESTORETIC) 20-12.5 MG per tablet Take 2 tablets by mouth daily.    Yes Historical Provider, MD  Multiple Vitamins-Minerals (CENTRUM SILVER PO) Take 1 tablet by mouth daily.   Yes Historical Provider, MD  naproxen sodium (ANAPROX) 220 MG tablet Take 440 mg by mouth as needed (for pain).   Yes Historical Provider, MD  nitroGLYCERIN (NITROSTAT) 0.4 MG SL tablet Place 1 tablet (0.4 mg total) under the tongue every 5 (five) minutes as needed for chest pain. 03/02/15  Yes Pixie Casino, MD  pantoprazole (PROTONIX) 40 MG tablet Take 40 mg by mouth daily.   Yes Historical Provider, MD  pravastatin (PRAVACHOL) 80 MG tablet Take 80 mg by mouth daily.  12/22/14  Yes Historical Provider, MD  triamcinolone cream (KENALOG) 0.1 % Apply 1 application topically 2 (two) times daily.  07/31/16  Yes Historical Provider, MD    Family History Family History  Problem Relation Age of Onset  . Heart attack Father   . Diabetes Mother   . Heart failure Mother   . Coronary artery disease Mother   . Stroke Mother   . Prostate cancer Brother   . Heart attack Brother     also CVA  . Heart attack Brother  also CVA  . Cancer Brother     Social History Social History  Substance Use Topics  . Smoking status: Never Smoker  . Smokeless tobacco: Never Used  . Alcohol use No     Allergies   Zocor [simvastatin]   Review of Systems Review of Systems  All other systems reviewed and are negative.    Physical Exam Updated Vital Signs BP (!) 157/88 (BP Location: Right Arm)   Pulse (!) 105   Temp 97.8 F (36.6 C) (Oral)   Resp 17   SpO2 97%   Physical Exam  Constitutional: He is oriented to person, place, and time. He appears well-developed and well-nourished. No distress.  HENT:  Head: Normocephalic and atraumatic.    Mouth/Throat: Oropharynx is clear and moist.  Neck: Normal range of motion. Neck supple.  Cardiovascular: Normal rate and regular rhythm.  Exam reveals no friction rub.   No murmur heard. Pulmonary/Chest: Effort normal and breath sounds normal. No respiratory distress. He has no wheezes. He has no rales.  Abdominal: Soft. Bowel sounds are normal. He exhibits no distension. There is no tenderness.  Musculoskeletal: Normal range of motion. He exhibits no edema.  Neurological: He is alert and oriented to person, place, and time. Coordination normal.  Skin: Skin is warm and dry. He is not diaphoretic.  Nursing note and vitals reviewed.    ED Treatments / Results  Labs (all labs ordered are listed, but only abnormal results are displayed) Labs Reviewed  CBC  LIPASE, BLOOD  COMPREHENSIVE METABOLIC PANEL  URINALYSIS, ROUTINE W REFLEX MICROSCOPIC  I-STAT TROPOININ, ED    EKG  EKG Interpretation None       Radiology Dg Chest 2 View  Result Date: 08/30/2016 CLINICAL DATA:  66 year old male with a history of dizziness EXAM: CHEST  2 VIEW COMPARISON:  Chest x-ray 03/02/2015, 01/04/2010 FINDINGS: Cardiomediastinal silhouette unchanged. No evidence of central vascular congestion. No pleural effusion or pneumothorax. Similar appearance of asymmetric elevation of the right hemidiaphragm, unchanged from prior. Atelectatic changes at the right lung base and adjacent to the minor fissure, similar to prior. No displaced fracture. IMPRESSION: No radiographic evidence of acute cardiopulmonary disease Electronically Signed   By: Corrie Mckusick D.O.   On: 08/30/2016 10:56    Procedures Procedures (including critical care time)  Medications Ordered in ED Medications  ketorolac (TORADOL) 30 MG/ML injection 30 mg (not administered)  ondansetron (ZOFRAN) injection 4 mg (not administered)  sodium chloride 0.9 % bolus 1,000 mL (not administered)     Initial Impression / Assessment and Plan / ED  Course  I have reviewed the triage vital signs and the nursing notes.  Pertinent labs & imaging results that were available during my care of the patient were reviewed by me and considered in my medical decision making (see chart for details).  Patient presents here with complaints of abdominal cramping, nausea, vomiting, diarrhea that started yesterday evening. This has all been nonbloody. His abdominal exam is benign and laboratory studies are reassuring. He has no leukocytosis and electrolytes are essentially unremarkable. He was hydrated with NS and given toradol/zofran. His presentation, workup, and response to medications is most consistent with a viral gastroenteritis. He will be treated with Zofran, clear liquids, and when necessary follow-up.  Final Clinical Impressions(s) / ED Diagnoses   Final diagnoses:  None    New Prescriptions New Prescriptions   No medications on file     Veryl Speak, MD 08/30/16 [id number]

## 2016-08-30 NOTE — Discharge Instructions (Signed)
Zofran as prescribed as needed for nausea.  Clear liquids for the next 12 hours, then slowly advance diet to normal.  Return to the emergency department for severe abdominal pain, high fevers, bloody stools, or other new and concerning symptoms.

## 2017-01-09 DIAGNOSIS — M792 Neuralgia and neuritis, unspecified: Secondary | ICD-10-CM | POA: Diagnosis not present

## 2017-01-09 DIAGNOSIS — M542 Cervicalgia: Secondary | ICD-10-CM | POA: Diagnosis not present

## 2017-01-30 DIAGNOSIS — Z23 Encounter for immunization: Secondary | ICD-10-CM | POA: Diagnosis not present

## 2017-03-13 ENCOUNTER — Ambulatory Visit (INDEPENDENT_AMBULATORY_CARE_PROVIDER_SITE_OTHER): Payer: Medicare Other | Admitting: Internal Medicine

## 2017-03-13 ENCOUNTER — Encounter: Payer: Self-pay | Admitting: Internal Medicine

## 2017-03-13 VITALS — BP 126/70 | HR 52 | Ht 73.0 in | Wt 194.0 lb

## 2017-03-13 DIAGNOSIS — E785 Hyperlipidemia, unspecified: Secondary | ICD-10-CM

## 2017-03-13 DIAGNOSIS — I10 Essential (primary) hypertension: Secondary | ICD-10-CM

## 2017-03-13 DIAGNOSIS — Z79899 Other long term (current) drug therapy: Secondary | ICD-10-CM | POA: Diagnosis not present

## 2017-03-13 DIAGNOSIS — I2583 Coronary atherosclerosis due to lipid rich plaque: Secondary | ICD-10-CM

## 2017-03-13 DIAGNOSIS — I2510 Atherosclerotic heart disease of native coronary artery without angina pectoris: Secondary | ICD-10-CM | POA: Diagnosis not present

## 2017-03-13 LAB — LIPID PANEL
CHOLESTEROL TOTAL: 145 mg/dL (ref 100–199)
Chol/HDL Ratio: 3.3 ratio (ref 0.0–5.0)
HDL: 44 mg/dL (ref >39)
LDL Calculated: 76 mg/dL (ref 0–99)
Triglycerides: 124 mg/dL (ref 0–149)
VLDL Cholesterol Cal: 25 mg/dL (ref 5–40)

## 2017-03-13 NOTE — Patient Instructions (Signed)
Medication Instructions:  NO CHANGES If you need a refill on your cardiac medications before your next appointment, please call your pharmacy.  Labwork: FASTING LIPID PANEL HERE IN OUR OFFICE AT [facility]  Follow-Up: Your physician wants you to follow-up in: 12 MONTHS WITH DR HILTY You should receive a reminder letter in the mail two months in advance. If you do not receive a letter, please call our office September 2019 to schedule the November 2019 follow-up appointment.  Thank you for choosing [facility] at [facility]!!

## 2017-03-15 ENCOUNTER — Encounter: Payer: Self-pay | Admitting: Internal Medicine

## 2017-03-15 NOTE — Progress Notes (Signed)
OFFICE NOTE  Chief Complaint:  No complaints  Primary Care Physician: Mayra Neer, MD  HPI:  Charles Oconnor  is a 66 year old gentleman with history of hypertension, dyslipidemia and mild to moderate coronary disease. We have been following him for dyslipidemia and he has had liver enzyme abnormalities before on Zocor and was changed to Pravachol with pretty good control. Sometimes he gets chest discomfort; however, it is pretty short lived and possibly related to that. His EKG today shows a sinus bradycardia at 54 and I understand recently he was placed on a beta blocker which caused marked sinus bradycardia and that was promptly discontinued. At this point, although there is possibly an occasion for that given his history of coronary disease, his heart rate being low, he is unable to tolerate a beta blocker. He is on an ACE inhibitor which is pretty good at controlling his blood pressure as well as amlodipine.   Charles Oconnor returns today for followup. He reports doing fairly well. He's recently started doing some walking and has actually lost about 10 pounds in the last 6 months. He says he does feel better and have more energy. A portion is developed some peripheral neuropathy. He's been started on Neurontin by a neurologist in [place]. He seems to be tolerating this medicine although has some occasional lightheadedness and dizziness. He is peripheral nerve pain is better however does have numbness in his feet. He occasionally gets some mild swelling. He also was told that he had high vitamin B6 levels and was told to stop taking his multivitamin.  I the pleasure see Charles Oconnor back in the office today. Overall he is doing fairly well except he is struggling with low back pain and some neuropathic pain in his legs. He's been getting back injections without much improvement. He's also been describing some occasional chest discomfort and progressive fatigue over the past several  months. He feels like he has no energy. There is also complaints of mid back pain between his shoulder blades which occurs on unusual occasions and not associated with his low back pain. Sometimes it's a little worse when exerting himself although he's not been as active due to his back problems. As a reminder he had heart catheterization in 2011 with Dr. Tina Griffiths, which demonstrated mild to moderate coronary artery disease.  He recently had lab work through his primary care provider which looks fairly normal except for an elevated cholesterol profile. His total cholesterol is 166, HL 43, triglycerides 112 and LDL 101. He's currently on pravastatin 80 mg daily and says that he has had problems with myalgias and liver function abnormalities on simvastatin in the past.  [name] returns today for follow-up. He recently underwent a stress test which showed the following:   The left ventricular ejection fraction is mildly decreased (45-54%).  Nuclear stress EF: 52%.  No T wave inversion was noted during stress.  There was no ST segment deviation noted during stress.  Defect 1: There is a medium defect of moderate severity.  There is a moderate-sized and intensity, partially reversible inferoseptal perfusion defect which could represent ischemia. LVEF 52% with basal inferoseptal dyskinesis. This is an intermediate risk study.   Based on these findings, I recommended either continued medical therapy or we could consider cardiac catheterization. Subsequently he felt like his chest pain was worsening and ultimately based on his prior catheterization results, we decided to proceed with cardiac catheterization. The results are as follows:   Prox RCA  lesion, 40% stenosed. The lesion was not previously treated.  Prox Cx to Dist Cx lesion, 20% stenosed. The lesion was not previously treated.  Ost 1st Diag to 1st Diag lesion, 40% stenosed. The lesion was not previously treated.  Dist LAD lesion, 20%  stenosed. The lesion was not previously treated.  The left ventricular systolic function is normal.  Mild, non-obstructive CAD - somewhat improved from prior cath in 2011. Normal LV function. False positive nuclear stress test. At this point, Charles Oconnor is doing much better and denies any chest pain or worsening shortness of breath. He gets a small amount of ankle edema which I attribute to amlodipine.  04/01/2016  Charles Oconnor returns today for follow-up. He denies any chest pain or worsening shortness of breath. He apparently had a sore throat yesterday and has some drainage today. He is interested in some over-the-counter medications for that. He's had no chest pain that sounds anginal. Blood pressure well controlled. Recent cholesterol check through his primary care provider shows good control with LDL less than 100.  03/13/2017  Charles Oconnor returns today for follow-up.  Overall he is doing exceedingly well.  Blood pressure is well-controlled today 126/70.  His body mass index is near normal.  Cholesterol is due to be assessed but has been well controlled.  EKG shows sinus bradycardia at 52 without ischemic changes.  He denies any chest pain or worsening shortness of breath.  PMHx:  Past Medical History:  Diagnosis Date  . Coronary artery disease    mild to moderate  . Dyslipidemia   . ED (erectile dysfunction)   . Hypertension   . Prostate cancer [facility])     Past Surgical History:  Procedure Laterality Date  . APPENDECTOMY  1985  . CARDIAC CATHETERIZATION  2002, 2011   mild-mod CAD  . HAND SURGERY Left    had joints fused  . PROSTATECTOMY  03/2007  . TOTAL KNEE ARTHROPLASTY  04/2009    FAMHx:  Family History  Problem Relation Age of Onset  . Heart attack Father   . Diabetes Mother   . Heart failure Mother   . Coronary artery disease Mother   . Stroke Mother   . Prostate cancer Brother   . Heart attack Brother        also CVA  . Heart attack Brother        also CVA    . Cancer Brother     SOCHx:   reports that  has never smoked. he has never used smokeless tobacco. He reports that he does not drink alcohol or use drugs.  ALLERGIES:  Allergies  Allergen Reactions  . Zocor [Simvastatin] Nausea Only and Other (See Comments)    Elevate liver enzymes    ROS: Pertinent items noted in HPI and remainder of comprehensive ROS otherwise negative.  HOME MEDS: Current Outpatient Medications  Medication Sig Dispense Refill  . amLODipine (NORVASC) 10 MG tablet Take 10 mg by mouth daily.    Marland Kitchen aspirin EC 81 MG tablet Take 81 mg by mouth daily.    Marland Kitchen lisinopril-hydrochlorothiazide (PRINZIDE,ZESTORETIC) 20-12.5 MG per tablet Take 2 tablets by mouth daily.     . Multiple Vitamins-Minerals (CENTRUM SILVER PO) Take 1 tablet by mouth daily.    . naproxen sodium (ANAPROX) 220 MG tablet Take 440 mg by mouth as needed (for pain).    . nitroGLYCERIN (NITROSTAT) 0.4 MG SL tablet Place 1 tablet (0.4 mg total) under the tongue every 5 (five) minutes as needed  for chest pain. 25 tablet 3  . pantoprazole (PROTONIX) 40 MG tablet Take 40 mg by mouth daily.    . pravastatin (PRAVACHOL) 80 MG tablet Take 80 mg by mouth daily.     Marland Kitchen triamcinolone cream (KENALOG) 0.1 % Apply 1 application topically 2 (two) times daily.      No current facility-administered medications for this visit.     LABS/IMAGING: No results found for this or any previous visit (from the past 48 hour(s)). No results found.  VITALS: BP 126/70   Pulse (!) 52   Ht 6\' 1"  (1.854 m)   Wt 194 lb (88 kg)   BMI 25.60 kg/m   EXAM: General appearance: alert, no distress and thin Neck: no carotid bruit, no JVD and thyroid not enlarged, symmetric, no tenderness/mass/nodules Lungs: clear to auscultation bilaterally Heart: regular rate and rhythm, S1, S2 normal, no murmur, click, rub or gallop Abdomen: soft, non-tender; bowel sounds normal; no masses,  no organomegaly Extremities: extremities normal,  atraumatic, no cyanosis or edema Pulses: 2+ and symmetric Skin: Skin color, texture, turgor normal. No rashes or lesions Neurologic: Grossly normal Psych: Pleasant  EKG: Sinus bradycardia 52-personally reviewed  ASSESSMENT: 1. Mild nonobstructive coronary disease by cath (2016), somewhat improved compared to his prior study in 2011 2. Hypertension-controlled 3. Dyslipidemia - controlled  PLAN: 1.   Charles Oconnor is doing well on medical therapy.  He has had no recurrent chest pain or other cardiac symptoms.  Blood pressures well controlled.  Cholesterol has been at goal and needs to be reassessed.  Will order a repeat lipid profile make changes accordingly.  Plan to see him back annually or sooner as necessary.  Pixie Casino, MD, [facility] Attending Cardiologist [place] 03/15/2017, 2:39 PM

## 2017-03-17 NOTE — Addendum Note (Signed)
Addended by: Venetia Maxon on: 03/17/2017 03:33 PM   Modules accepted: Orders

## 2017-03-27 ENCOUNTER — Other Ambulatory Visit: Payer: Self-pay | Admitting: Family Medicine

## 2017-03-27 DIAGNOSIS — M549 Dorsalgia, unspecified: Secondary | ICD-10-CM | POA: Diagnosis not present

## 2017-03-27 DIAGNOSIS — Z0000 Encounter for general adult medical examination without abnormal findings: Secondary | ICD-10-CM | POA: Diagnosis not present

## 2017-03-27 DIAGNOSIS — I119 Hypertensive heart disease without heart failure: Secondary | ICD-10-CM | POA: Diagnosis not present

## 2017-03-27 DIAGNOSIS — E782 Mixed hyperlipidemia: Secondary | ICD-10-CM | POA: Diagnosis not present

## 2017-03-27 DIAGNOSIS — Z1159 Encounter for screening for other viral diseases: Secondary | ICD-10-CM | POA: Diagnosis not present

## 2017-03-27 DIAGNOSIS — G8929 Other chronic pain: Secondary | ICD-10-CM

## 2017-03-27 DIAGNOSIS — N529 Male erectile dysfunction, unspecified: Secondary | ICD-10-CM | POA: Diagnosis not present

## 2017-03-27 DIAGNOSIS — I2510 Atherosclerotic heart disease of native coronary artery without angina pectoris: Secondary | ICD-10-CM | POA: Diagnosis not present

## 2017-03-27 DIAGNOSIS — F411 Generalized anxiety disorder: Secondary | ICD-10-CM | POA: Diagnosis not present

## 2017-03-27 DIAGNOSIS — M545 Low back pain: Principal | ICD-10-CM

## 2017-03-27 DIAGNOSIS — Z8546 Personal history of malignant neoplasm of prostate: Secondary | ICD-10-CM | POA: Diagnosis not present

## 2017-03-27 DIAGNOSIS — M542 Cervicalgia: Secondary | ICD-10-CM | POA: Diagnosis not present

## 2017-04-13 ENCOUNTER — Ambulatory Visit
Admission: RE | Admit: 2017-04-13 | Discharge: 2017-04-13 | Disposition: A | Discharge status: Home / Self Care | Payer: Medicare Other | Source: Ambulatory Visit | Attending: Family Medicine | Admitting: Family Medicine

## 2017-04-13 DIAGNOSIS — M545 Low back pain: Principal | ICD-10-CM

## 2017-04-13 DIAGNOSIS — G8929 Other chronic pain: Secondary | ICD-10-CM

## 2017-04-13 DIAGNOSIS — M48061 Spinal stenosis, lumbar region without neurogenic claudication: Secondary | ICD-10-CM | POA: Diagnosis not present

## 2017-04-13 DIAGNOSIS — M4802 Spinal stenosis, cervical region: Secondary | ICD-10-CM | POA: Diagnosis not present

## 2017-04-29 DIAGNOSIS — M5136 Other intervertebral disc degeneration, lumbar region: Secondary | ICD-10-CM | POA: Diagnosis not present

## 2017-04-29 DIAGNOSIS — I10 Essential (primary) hypertension: Secondary | ICD-10-CM | POA: Diagnosis not present

## 2017-04-29 DIAGNOSIS — Z6825 Body mass index (BMI) 25.0-25.9, adult: Secondary | ICD-10-CM | POA: Diagnosis not present

## 2017-04-29 DIAGNOSIS — M4802 Spinal stenosis, cervical region: Secondary | ICD-10-CM | POA: Diagnosis not present

## 2017-05-19 DIAGNOSIS — M47816 Spondylosis without myelopathy or radiculopathy, lumbar region: Secondary | ICD-10-CM | POA: Diagnosis not present

## 2017-05-19 DIAGNOSIS — M4802 Spinal stenosis, cervical region: Secondary | ICD-10-CM | POA: Diagnosis not present

## 2017-05-19 DIAGNOSIS — I10 Essential (primary) hypertension: Secondary | ICD-10-CM | POA: Diagnosis not present

## 2017-05-19 DIAGNOSIS — M48062 Spinal stenosis, lumbar region with neurogenic claudication: Secondary | ICD-10-CM | POA: Diagnosis not present

## 2017-05-19 DIAGNOSIS — M47812 Spondylosis without myelopathy or radiculopathy, cervical region: Secondary | ICD-10-CM | POA: Diagnosis not present

## 2017-05-19 DIAGNOSIS — Z6826 Body mass index (BMI) 26.0-26.9, adult: Secondary | ICD-10-CM | POA: Diagnosis not present

## 2017-05-27 DIAGNOSIS — H524 Presbyopia: Secondary | ICD-10-CM | POA: Diagnosis not present

## 2017-05-27 DIAGNOSIS — H25813 Combined forms of age-related cataract, bilateral: Secondary | ICD-10-CM | POA: Diagnosis not present

## 2017-05-27 DIAGNOSIS — H43393 Other vitreous opacities, bilateral: Secondary | ICD-10-CM | POA: Diagnosis not present

## 2017-05-27 DIAGNOSIS — H52203 Unspecified astigmatism, bilateral: Secondary | ICD-10-CM | POA: Diagnosis not present

## 2017-05-27 DIAGNOSIS — H5203 Hypermetropia, bilateral: Secondary | ICD-10-CM | POA: Diagnosis not present

## 2017-05-27 DIAGNOSIS — H02834 Dermatochalasis of left upper eyelid: Secondary | ICD-10-CM | POA: Diagnosis not present

## 2017-05-27 DIAGNOSIS — H02831 Dermatochalasis of right upper eyelid: Secondary | ICD-10-CM | POA: Diagnosis not present

## 2017-05-29 DIAGNOSIS — M47816 Spondylosis without myelopathy or radiculopathy, lumbar region: Secondary | ICD-10-CM | POA: Diagnosis not present

## 2017-06-05 DIAGNOSIS — C61 Malignant neoplasm of prostate: Secondary | ICD-10-CM | POA: Diagnosis not present

## 2017-06-11 DIAGNOSIS — N5201 Erectile dysfunction due to arterial insufficiency: Secondary | ICD-10-CM | POA: Diagnosis not present

## 2017-06-11 DIAGNOSIS — C61 Malignant neoplasm of prostate: Secondary | ICD-10-CM | POA: Diagnosis not present

## 2017-06-11 DIAGNOSIS — N393 Stress incontinence (female) (male): Secondary | ICD-10-CM | POA: Diagnosis not present

## 2017-06-19 DIAGNOSIS — Z6826 Body mass index (BMI) 26.0-26.9, adult: Secondary | ICD-10-CM | POA: Diagnosis not present

## 2017-06-19 DIAGNOSIS — M47812 Spondylosis without myelopathy or radiculopathy, cervical region: Secondary | ICD-10-CM | POA: Diagnosis not present

## 2017-06-19 DIAGNOSIS — I10 Essential (primary) hypertension: Secondary | ICD-10-CM | POA: Diagnosis not present

## 2017-06-19 DIAGNOSIS — M47816 Spondylosis without myelopathy or radiculopathy, lumbar region: Secondary | ICD-10-CM | POA: Diagnosis not present

## 2017-06-25 DIAGNOSIS — I10 Essential (primary) hypertension: Secondary | ICD-10-CM | POA: Diagnosis not present

## 2017-06-25 DIAGNOSIS — Z6826 Body mass index (BMI) 26.0-26.9, adult: Secondary | ICD-10-CM | POA: Diagnosis not present

## 2017-06-25 DIAGNOSIS — M47812 Spondylosis without myelopathy or radiculopathy, cervical region: Secondary | ICD-10-CM | POA: Diagnosis not present

## 2017-07-03 DIAGNOSIS — M47812 Spondylosis without myelopathy or radiculopathy, cervical region: Secondary | ICD-10-CM | POA: Diagnosis not present

## 2017-07-03 DIAGNOSIS — I10 Essential (primary) hypertension: Secondary | ICD-10-CM | POA: Diagnosis not present

## 2017-07-17 DIAGNOSIS — M47816 Spondylosis without myelopathy or radiculopathy, lumbar region: Secondary | ICD-10-CM | POA: Diagnosis not present

## 2017-07-17 DIAGNOSIS — I10 Essential (primary) hypertension: Secondary | ICD-10-CM | POA: Diagnosis not present

## 2017-07-22 DIAGNOSIS — Z6826 Body mass index (BMI) 26.0-26.9, adult: Secondary | ICD-10-CM | POA: Diagnosis not present

## 2017-07-22 DIAGNOSIS — I10 Essential (primary) hypertension: Secondary | ICD-10-CM | POA: Diagnosis not present

## 2017-07-22 DIAGNOSIS — M47816 Spondylosis without myelopathy or radiculopathy, lumbar region: Secondary | ICD-10-CM | POA: Diagnosis not present

## 2017-07-31 DIAGNOSIS — M47812 Spondylosis without myelopathy or radiculopathy, cervical region: Secondary | ICD-10-CM | POA: Diagnosis not present

## 2017-08-07 DIAGNOSIS — M47812 Spondylosis without myelopathy or radiculopathy, cervical region: Secondary | ICD-10-CM | POA: Diagnosis not present

## 2017-08-14 DIAGNOSIS — D2271 Melanocytic nevi of right lower limb, including hip: Secondary | ICD-10-CM | POA: Diagnosis not present

## 2017-08-14 DIAGNOSIS — D225 Melanocytic nevi of trunk: Secondary | ICD-10-CM | POA: Diagnosis not present

## 2017-08-14 DIAGNOSIS — L821 Other seborrheic keratosis: Secondary | ICD-10-CM | POA: Diagnosis not present

## 2017-08-14 DIAGNOSIS — D2261 Melanocytic nevi of right upper limb, including shoulder: Secondary | ICD-10-CM | POA: Diagnosis not present

## 2017-08-14 DIAGNOSIS — D2272 Melanocytic nevi of left lower limb, including hip: Secondary | ICD-10-CM | POA: Diagnosis not present

## 2017-08-14 DIAGNOSIS — Z85828 Personal history of other malignant neoplasm of skin: Secondary | ICD-10-CM | POA: Diagnosis not present

## 2017-08-14 DIAGNOSIS — L308 Other specified dermatitis: Secondary | ICD-10-CM | POA: Diagnosis not present

## 2017-08-14 DIAGNOSIS — D1801 Hemangioma of skin and subcutaneous tissue: Secondary | ICD-10-CM | POA: Diagnosis not present

## 2017-08-14 DIAGNOSIS — L570 Actinic keratosis: Secondary | ICD-10-CM | POA: Diagnosis not present

## 2017-08-21 DIAGNOSIS — R030 Elevated blood-pressure reading, without diagnosis of hypertension: Secondary | ICD-10-CM | POA: Diagnosis not present

## 2017-08-21 DIAGNOSIS — M47812 Spondylosis without myelopathy or radiculopathy, cervical region: Secondary | ICD-10-CM | POA: Diagnosis not present

## 2017-08-21 DIAGNOSIS — M47816 Spondylosis without myelopathy or radiculopathy, lumbar region: Secondary | ICD-10-CM | POA: Diagnosis not present

## 2017-08-27 DIAGNOSIS — M47812 Spondylosis without myelopathy or radiculopathy, cervical region: Secondary | ICD-10-CM | POA: Diagnosis not present

## 2017-08-27 DIAGNOSIS — M62511 Muscle wasting and atrophy, not elsewhere classified, right shoulder: Secondary | ICD-10-CM | POA: Diagnosis not present

## 2017-08-27 DIAGNOSIS — M542 Cervicalgia: Secondary | ICD-10-CM | POA: Diagnosis not present

## 2017-08-27 DIAGNOSIS — M2560 Stiffness of unspecified joint, not elsewhere classified: Secondary | ICD-10-CM | POA: Diagnosis not present

## 2017-08-31 DIAGNOSIS — M542 Cervicalgia: Secondary | ICD-10-CM | POA: Diagnosis not present

## 2017-08-31 DIAGNOSIS — M2560 Stiffness of unspecified joint, not elsewhere classified: Secondary | ICD-10-CM | POA: Diagnosis not present

## 2017-08-31 DIAGNOSIS — M47812 Spondylosis without myelopathy or radiculopathy, cervical region: Secondary | ICD-10-CM | POA: Diagnosis not present

## 2017-08-31 DIAGNOSIS — M62511 Muscle wasting and atrophy, not elsewhere classified, right shoulder: Secondary | ICD-10-CM | POA: Diagnosis not present

## 2017-09-04 DIAGNOSIS — M62511 Muscle wasting and atrophy, not elsewhere classified, right shoulder: Secondary | ICD-10-CM | POA: Diagnosis not present

## 2017-09-04 DIAGNOSIS — M2560 Stiffness of unspecified joint, not elsewhere classified: Secondary | ICD-10-CM | POA: Diagnosis not present

## 2017-09-04 DIAGNOSIS — M47812 Spondylosis without myelopathy or radiculopathy, cervical region: Secondary | ICD-10-CM | POA: Diagnosis not present

## 2017-09-04 DIAGNOSIS — M542 Cervicalgia: Secondary | ICD-10-CM | POA: Diagnosis not present

## 2017-09-08 DIAGNOSIS — M47812 Spondylosis without myelopathy or radiculopathy, cervical region: Secondary | ICD-10-CM | POA: Diagnosis not present

## 2017-09-08 DIAGNOSIS — M62511 Muscle wasting and atrophy, not elsewhere classified, right shoulder: Secondary | ICD-10-CM | POA: Diagnosis not present

## 2017-09-08 DIAGNOSIS — M542 Cervicalgia: Secondary | ICD-10-CM | POA: Diagnosis not present

## 2017-09-08 DIAGNOSIS — M2560 Stiffness of unspecified joint, not elsewhere classified: Secondary | ICD-10-CM | POA: Diagnosis not present

## 2017-09-11 ENCOUNTER — Telehealth: Payer: Self-pay | Admitting: Internal Medicine

## 2017-09-11 ENCOUNTER — Other Ambulatory Visit: Payer: Self-pay | Admitting: Neurosurgery

## 2017-09-11 DIAGNOSIS — M5412 Radiculopathy, cervical region: Secondary | ICD-10-CM | POA: Diagnosis not present

## 2017-09-11 DIAGNOSIS — I10 Essential (primary) hypertension: Secondary | ICD-10-CM | POA: Diagnosis not present

## 2017-09-11 DIAGNOSIS — Z6826 Body mass index (BMI) 26.0-26.9, adult: Secondary | ICD-10-CM | POA: Diagnosis not present

## 2017-09-11 NOTE — Telephone Encounter (Signed)
   [facility] Pre-operative Risk Assessment    Request for surgical clearance:  1. What type of surgery is being performed? C3/4, C4/5, C5/6, C6/7 anterior cervical fusion    2. When is this surgery scheduled? 09/22/17   3. What type of clearance is required (medical clearance vs. Pharmacy clearance to hold med vs. Both)? Both   4. Are there any medications that need to be held prior to surgery and how long? Not specified   5. Practice name and name of physician performing surgery? Dr. Earnie Larsson @ [facility]    6. What is your office phone number: [phone] (ext: [phone] for Vanessa)    7.   What is your office fax number: [phone]  8.   Anesthesia type (None, local, MAC, general) ? Not specified    Fidel Levy 09/11/2017, 4:21 PM  _________________________________________________________________   (provider comments below)

## 2017-09-14 NOTE — Telephone Encounter (Signed)
   Primary Cardiologist: No primary care provider on file.  Chart reviewed as part of pre-operative protocol coverage. Given past medical history and time since last visit, based on ACC/AHA guidelines, Dontell L Ratay would be at acceptable risk for the planned procedure without further cardiovascular testing. He easily getting > 4 mets of activity.   Dr. Debara Pickett, is it ok to hold ASA (patien say Dr. Annette Stable has advised him to hold 7 days prior)?  Please forward your response to [facility] PREOP. Thank you   Leanor Kail, PA 09/14/2017, 2:57 PM

## 2017-09-15 NOTE — Telephone Encounter (Signed)
Ok to hold aspirin 7 days prior to surgery and restart as soon as practical afterward.  Dr. Lemmie Evens

## 2017-09-17 NOTE — Pre-Procedure Instructions (Signed)
Charles Oconnor  09/17/2017      [id number] [address] [place] [address] Phone: [phone] Fax: [facility] [place], [place] [address] Phone: [phone] Fax: [phone]    Your procedure is scheduled on Tues., Sep 22, 2017  Report to [facility] Admitting Entrance "A" at 6:00AM  Call this number if you have problems the morning of surgery:  [phone]   Remember:  Do not eat food or drink liquids after midnight.  Take these medicines the morning of surgery with A SIP OF WATER: AmLODipine (NORVASC)  Follow your doctors instructions regarding your Aspirin.  If no instructions were given by your doctor, then you will need to call the prescribing office office to get instructions.    As of today, stop taking all other Aspirins, Vitamins, Fish oils, and Herbal medications. Also stop all NSAIDS i.e. Advil, Ibuprofen, Motrin, Aleve, Anaprox, Naproxen, BC and Goody Powders.   Do not wear jewelry.  Do not wear lotions, powders, colognes, or deodorant.  Do not shave 48 hours prior to surgery.  Men may shave face.  Do not bring valuables to the hospital.  [facility] is not responsible for any belongings or valuables.  Contacts, dentures or bridgework may not be worn into surgery.  Leave your suitcase in the car.  After surgery it may be brought to your room.  For patients admitted to the hospital, discharge time will be determined by your treatment team.  Patients discharged the day of surgery will not be allowed to drive home.   Special instructions:   [place]- Preparing For Surgery  Before surgery, you can play an important role. Because skin is not sterile, your skin needs to be as free of germs as possible. You can reduce the number of germs on your skin by  washing with CHG (chlorahexidine gluconate) Soap before surgery.  CHG is an antiseptic cleaner which kills germs and bonds with the skin to continue killing germs even after washing.  Oral Hygiene is also important to reduce your risk of infection.  Remember - BRUSH YOUR TEETH THE MORNING OF SURGERY  Please do not use if you have an allergy to CHG or antibacterial soaps. If your skin becomes reddened/irritated stop using the CHG.  Do not shave (including legs and underarms) for at least 48 hours prior to first CHG shower. It is OK to shave your face.  Please follow these instructions carefully.   1. Shower the NIGHT BEFORE SURGERY and the MORNING OF SURGERY with CHG.   2. If you chose to wash your hair, wash your hair first as usual with your normal shampoo.  3. After you shampoo, rinse your hair and body thoroughly to remove the shampoo.  4. Use CHG as you would any other liquid soap. You can apply CHG directly to the skin and wash gently with a scrungie or a clean washcloth.   5. Apply the CHG Soap to your body ONLY FROM THE NECK DOWN.  Do not use on open wounds or open sores. Avoid contact with your eyes, ears, mouth and genitals (private parts). Wash Face and genitals (private parts)  with your normal soap.  6. Wash thoroughly, paying special attention to the area where your surgery will be performed.  7. Thoroughly rinse your body  with warm water from the neck down.  8. DO NOT shower/wash with your normal soap after using and rinsing off the CHG Soap.  9. Pat yourself dry with a CLEAN TOWEL.  10. Wear CLEAN PAJAMAS to bed the night before surgery, wear comfortable clothes the morning of surgery  11. Place CLEAN SHEETS on your bed the night of your first shower and DO NOT SLEEP WITH PETS.  Day of Surgery: Do not apply any deodorants/lotions. Please wear clean clothes to the hospital/surgery center.  Remember to brush your teeth.    Please read over the following fact sheets that  you were given. Pain Booklet, Coughing and Deep Breathing, MRSA Information and Surgical Site Infection Prevention

## 2017-09-18 ENCOUNTER — Other Ambulatory Visit: Payer: Self-pay

## 2017-09-18 ENCOUNTER — Encounter (HOSPITAL_COMMUNITY): Payer: Self-pay

## 2017-09-18 ENCOUNTER — Encounter (HOSPITAL_COMMUNITY)
Admission: RE | Admit: 2017-09-18 | Discharge: 2017-09-18 | Disposition: A | Discharge status: Home / Self Care | Payer: Medicare Other | Source: Ambulatory Visit | Attending: Neurosurgery | Admitting: Neurosurgery

## 2017-09-18 DIAGNOSIS — Z01812 Encounter for preprocedural laboratory examination: Secondary | ICD-10-CM | POA: Insufficient documentation

## 2017-09-18 HISTORY — DX: Spinal stenosis, cervical region: M48.02

## 2017-09-18 HISTORY — DX: Other specified postprocedural states: Z98.890

## 2017-09-18 HISTORY — DX: Other specified postprocedural states: R11.2

## 2017-09-18 HISTORY — DX: Other complications of anesthesia, initial encounter: T88.59XA

## 2017-09-18 HISTORY — DX: Adverse effect of unspecified anesthetic, initial encounter: T41.45XA

## 2017-09-18 LAB — CBC WITH DIFFERENTIAL/PLATELET
BASOS ABS: 0 10*3/uL (ref 0.0–0.1)
BASOS PCT: 1 %
EOS PCT: 5 %
Eosinophils Absolute: 0.3 10*3/uL (ref 0.0–0.7)
HEMATOCRIT: 42.7 % (ref 39.0–52.0)
Hemoglobin: 14.1 g/dL (ref 13.0–17.0)
Lymphocytes Relative: 27 %
Lymphs Abs: 1.7 10*3/uL (ref 0.7–4.0)
MCH: 29.7 pg (ref 26.0–34.0)
MCHC: 33 g/dL (ref 30.0–36.0)
MCV: 90.1 fL (ref 78.0–100.0)
MONO ABS: 0.7 10*3/uL (ref 0.1–1.0)
MONOS PCT: 10 %
NEUTROS ABS: 3.6 10*3/uL (ref 1.7–7.7)
Neutrophils Relative %: 57 %
PLATELETS: 213 10*3/uL (ref 150–400)
RBC: 4.74 MIL/uL (ref 4.22–5.81)
RDW: 13.6 % (ref 11.5–15.5)
WBC: 6.3 10*3/uL (ref 4.0–10.5)

## 2017-09-18 LAB — BASIC METABOLIC PANEL
ANION GAP: 10 (ref 5–15)
BUN: 25 mg/dL — ABNORMAL HIGH (ref 6–20)
CO2: 27 mmol/L (ref 22–32)
Calcium: 9.9 mg/dL (ref 8.9–10.3)
Chloride: 106 mmol/L (ref 101–111)
Creatinine, Ser: 1.24 mg/dL (ref 0.61–1.24)
GFR, EST NON AFRICAN AMERICAN: 58 mL/min — AB (ref >60)
Glucose, Bld: 115 mg/dL — ABNORMAL HIGH (ref 65–99)
POTASSIUM: 4.2 mmol/L (ref 3.5–5.1)
SODIUM: 143 mmol/L (ref 135–145)

## 2017-09-18 LAB — SURGICAL PCR SCREEN
MRSA, PCR: NEGATIVE
STAPHYLOCOCCUS AUREUS: NEGATIVE

## 2017-09-18 NOTE — Progress Notes (Signed)
PCP - Dr. Serita Grammes  Cardiologist - Dr. Hilty-Clearance note 5/6  Chest x-ray - Denies  EKG - 03/13/17 (E)  Stress Test - 02/16/15 (E)  ECHO - Denies  Cardiac Cath - 03/12/15 (E)  Sleep Study - Denies CPAP - None  LABS- 09/18/17: CBC w/D, BMP  ASA- LD- 09/12/17   Anesthesia- No  Pt denies having chest pain, sob, or fever at this time. All instructions explained to the pt, with a verbal understanding of the material. Pt agrees to go over the instructions while at home for a better understanding. The opportunity to ask questions was provided.

## 2017-09-22 ENCOUNTER — Inpatient Hospital Stay (HOSPITAL_COMMUNITY): Payer: Medicare Other | Admitting: Anesthesiology

## 2017-09-22 ENCOUNTER — Inpatient Hospital Stay (HOSPITAL_COMMUNITY)
Admission: RE | Disposition: A | Discharge status: Home / Self Care | Payer: Self-pay | Source: Ambulatory Visit | Attending: Neurosurgery

## 2017-09-22 ENCOUNTER — Encounter (HOSPITAL_COMMUNITY): Payer: Self-pay | Admitting: Certified Registered Nurse Anesthetist

## 2017-09-22 ENCOUNTER — Inpatient Hospital Stay (HOSPITAL_COMMUNITY): Payer: Medicare Other

## 2017-09-22 ENCOUNTER — Inpatient Hospital Stay (HOSPITAL_COMMUNITY)
Admission: RE | Admit: 2017-09-22 | Discharge: 2017-09-23 | DRG: 472 | Disposition: A | Discharge status: Home / Self Care | Payer: Medicare Other | Source: Ambulatory Visit | Attending: Neurosurgery | Admitting: Neurosurgery

## 2017-09-22 DIAGNOSIS — K219 Gastro-esophageal reflux disease without esophagitis: Secondary | ICD-10-CM | POA: Diagnosis present

## 2017-09-22 DIAGNOSIS — N529 Male erectile dysfunction, unspecified: Secondary | ICD-10-CM | POA: Diagnosis present

## 2017-09-22 DIAGNOSIS — Z888 Allergy status to other drugs, medicaments and biological substances status: Secondary | ICD-10-CM | POA: Diagnosis not present

## 2017-09-22 DIAGNOSIS — M4722 Other spondylosis with radiculopathy, cervical region: Secondary | ICD-10-CM | POA: Diagnosis present

## 2017-09-22 DIAGNOSIS — Z96659 Presence of unspecified artificial knee joint: Secondary | ICD-10-CM | POA: Diagnosis present

## 2017-09-22 DIAGNOSIS — E785 Hyperlipidemia, unspecified: Secondary | ICD-10-CM | POA: Diagnosis present

## 2017-09-22 DIAGNOSIS — I10 Essential (primary) hypertension: Secondary | ICD-10-CM | POA: Diagnosis present

## 2017-09-22 DIAGNOSIS — Z7982 Long term (current) use of aspirin: Secondary | ICD-10-CM | POA: Diagnosis not present

## 2017-09-22 DIAGNOSIS — M5011 Cervical disc disorder with radiculopathy,  high cervical region: Secondary | ICD-10-CM | POA: Diagnosis present

## 2017-09-22 DIAGNOSIS — I2510 Atherosclerotic heart disease of native coronary artery without angina pectoris: Secondary | ICD-10-CM | POA: Diagnosis present

## 2017-09-22 DIAGNOSIS — Z79899 Other long term (current) drug therapy: Secondary | ICD-10-CM

## 2017-09-22 DIAGNOSIS — M4802 Spinal stenosis, cervical region: Principal | ICD-10-CM | POA: Diagnosis present

## 2017-09-22 DIAGNOSIS — M4322 Fusion of spine, cervical region: Secondary | ICD-10-CM | POA: Diagnosis not present

## 2017-09-22 DIAGNOSIS — Z8546 Personal history of malignant neoplasm of prostate: Secondary | ICD-10-CM | POA: Diagnosis not present

## 2017-09-22 DIAGNOSIS — G992 Myelopathy in diseases classified elsewhere: Secondary | ICD-10-CM | POA: Diagnosis present

## 2017-09-22 DIAGNOSIS — M47812 Spondylosis without myelopathy or radiculopathy, cervical region: Secondary | ICD-10-CM | POA: Diagnosis not present

## 2017-09-22 HISTORY — PX: ANTERIOR CERVICAL DECOMPRESSION/DISCECTOMY FUSION 4 LEVELS: SHX5556

## 2017-09-22 SURGERY — ANTERIOR CERVICAL DECOMPRESSION/DISCECTOMY FUSION 4 LEVELS
Anesthesia: General

## 2017-09-22 MED ORDER — LISINOPRIL-HYDROCHLOROTHIAZIDE 20-12.5 MG PO TABS: 2.0000 | ORAL_TABLET | Freq: Every day | ORAL | Status: DC

## 2017-09-22 MED ORDER — SODIUM CHLORIDE 0.9 % IV SOLN
250.0000 mL | INTRAVENOUS | Status: DC
  Administered 2017-09-22: 250 mL via INTRAVENOUS

## 2017-09-22 MED ORDER — FENTANYL CITRATE (PF) 100 MCG/2ML IJ SOLN
INTRAMUSCULAR | Status: DC | PRN
  Administered 2017-09-22 (×2): 50 ug via INTRAVENOUS
  Administered 2017-09-22: 100 ug via INTRAVENOUS
  Administered 2017-09-22: 50 ug via INTRAVENOUS

## 2017-09-22 MED ORDER — HYDROMORPHONE HCL 2 MG/ML IJ SOLN
INTRAMUSCULAR | Status: AC
  Filled 2017-09-22: qty 1

## 2017-09-22 MED ORDER — SODIUM CHLORIDE 0.9% FLUSH: 3.0000 mL | INTRAVENOUS | Status: DC | PRN

## 2017-09-22 MED ORDER — PRAVASTATIN SODIUM 40 MG PO TABS
80.0000 mg | ORAL_TABLET | Freq: Every day | ORAL | Status: DC
  Administered 2017-09-22: 80 mg via ORAL
  Filled 2017-09-22: qty 2

## 2017-09-22 MED ORDER — HYDROMORPHONE HCL 2 MG/ML IJ SOLN
0.3000 mg | INTRAMUSCULAR | Status: DC | PRN
  Administered 2017-09-22: 0.5 mg via INTRAVENOUS

## 2017-09-22 MED ORDER — DEXAMETHASONE SODIUM PHOSPHATE 10 MG/ML IJ SOLN
INTRAMUSCULAR | Status: AC
  Filled 2017-09-22: qty 1

## 2017-09-22 MED ORDER — AMLODIPINE BESYLATE 5 MG PO TABS: 10.0000 mg | ORAL_TABLET | Freq: Every day | ORAL | Status: DC

## 2017-09-22 MED ORDER — ONDANSETRON HCL 4 MG/2ML IJ SOLN
INTRAMUSCULAR | Status: AC
  Filled 2017-09-22: qty 2

## 2017-09-22 MED ORDER — CYCLOBENZAPRINE HCL 10 MG PO TABS
10.0000 mg | ORAL_TABLET | Freq: Three times a day (TID) | ORAL | Status: DC | PRN
  Administered 2017-09-22: 10 mg via ORAL

## 2017-09-22 MED ORDER — DEXAMETHASONE SODIUM PHOSPHATE 10 MG/ML IJ SOLN
10.0000 mg | INTRAMUSCULAR | Status: AC
  Administered 2017-09-22: 10 mg via INTRAVENOUS

## 2017-09-22 MED ORDER — CYCLOBENZAPRINE HCL 10 MG PO TABS
ORAL_TABLET | ORAL | Status: AC
  Filled 2017-09-22: qty 1

## 2017-09-22 MED ORDER — HYDROCHLOROTHIAZIDE 25 MG PO TABS
25.0000 mg | ORAL_TABLET | Freq: Every day | ORAL | Status: DC
  Administered 2017-09-22: 25 mg via ORAL
  Filled 2017-09-22: qty 1

## 2017-09-22 MED ORDER — CEFAZOLIN SODIUM-DEXTROSE 1-4 GM/50ML-% IV SOLN
1.0000 g | Freq: Three times a day (TID) | INTRAVENOUS | Status: AC
  Administered 2017-09-22 (×2): 1 g via INTRAVENOUS
  Filled 2017-09-22 (×2): qty 50

## 2017-09-22 MED ORDER — MIDAZOLAM HCL 5 MG/5ML IJ SOLN
INTRAMUSCULAR | Status: DC | PRN
  Administered 2017-09-22: 2 mg via INTRAVENOUS

## 2017-09-22 MED ORDER — LIDOCAINE 2% (20 MG/ML) 5 ML SYRINGE
INTRAMUSCULAR | Status: AC
  Filled 2017-09-22: qty 5

## 2017-09-22 MED ORDER — HYDROCODONE-ACETAMINOPHEN 5-325 MG PO TABS: 1.0000 | ORAL_TABLET | ORAL | Status: DC | PRN

## 2017-09-22 MED ORDER — HYDROCODONE-ACETAMINOPHEN 10-325 MG PO TABS
2.0000 | ORAL_TABLET | ORAL | Status: DC | PRN
  Administered 2017-09-22 – 2017-09-23 (×4): 2 via ORAL
  Filled 2017-09-22 (×4): qty 2

## 2017-09-22 MED ORDER — ONDANSETRON HCL 4 MG/2ML IJ SOLN
INTRAMUSCULAR | Status: DC | PRN
  Administered 2017-09-22: 4 mg via INTRAVENOUS

## 2017-09-22 MED ORDER — PHENYLEPHRINE HCL 10 MG/ML IJ SOLN
INTRAVENOUS | Status: DC | PRN
  Administered 2017-09-22: 25 ug/min via INTRAVENOUS

## 2017-09-22 MED ORDER — LACTATED RINGERS IV SOLN
INTRAVENOUS | Status: DC
  Administered 2017-09-22 (×2): via INTRAVENOUS
  Administered 2017-09-22: 50 mL/h via INTRAVENOUS

## 2017-09-22 MED ORDER — SUGAMMADEX SODIUM 200 MG/2ML IV SOLN
INTRAVENOUS | Status: AC
  Filled 2017-09-22: qty 2

## 2017-09-22 MED ORDER — PHENOL 1.4 % MT LIQD
1.0000 | OROMUCOSAL | Status: DC | PRN
  Filled 2017-09-22: qty 177

## 2017-09-22 MED ORDER — ONDANSETRON HCL 4 MG PO TABS: 4.0000 mg | ORAL_TABLET | Freq: Four times a day (QID) | ORAL | Status: DC | PRN

## 2017-09-22 MED ORDER — THROMBIN 20000 UNITS EX SOLR
CUTANEOUS | Status: AC
  Filled 2017-09-22: qty 20000

## 2017-09-22 MED ORDER — NAPROXEN 250 MG PO TABS: 375.0000 mg | ORAL_TABLET | Freq: Three times a day (TID) | ORAL | Status: DC | PRN

## 2017-09-22 MED ORDER — NAPROXEN SODIUM 220 MG PO TABS: 440.0000 mg | ORAL_TABLET | Freq: Three times a day (TID) | ORAL | Status: DC | PRN

## 2017-09-22 MED ORDER — ROCURONIUM BROMIDE 50 MG/5ML IV SOLN
INTRAVENOUS | Status: AC
  Filled 2017-09-22: qty 1

## 2017-09-22 MED ORDER — CEFAZOLIN SODIUM-DEXTROSE 2-4 GM/100ML-% IV SOLN
2.0000 g | INTRAVENOUS | Status: AC
  Administered 2017-09-22: 2 g via INTRAVENOUS

## 2017-09-22 MED ORDER — ACETAMINOPHEN 650 MG RE SUPP: 650.0000 mg | RECTAL | Status: DC | PRN

## 2017-09-22 MED ORDER — MIDAZOLAM HCL 2 MG/2ML IJ SOLN
INTRAMUSCULAR | Status: AC
  Filled 2017-09-22: qty 2

## 2017-09-22 MED ORDER — MENTHOL 3 MG MT LOZG
1.0000 | LOZENGE | OROMUCOSAL | Status: DC | PRN
  Filled 2017-09-22: qty 9

## 2017-09-22 MED ORDER — SODIUM CHLORIDE 0.9 % IV SOLN
INTRAVENOUS | Status: DC | PRN
  Administered 2017-09-22: 08:00:00

## 2017-09-22 MED ORDER — SUGAMMADEX SODIUM 200 MG/2ML IV SOLN
INTRAVENOUS | Status: DC | PRN
  Administered 2017-09-22: 200 mg via INTRAVENOUS

## 2017-09-22 MED ORDER — OXYCODONE HCL 5 MG/5ML PO SOLN: 5.0000 mg | Freq: Once | ORAL | Status: AC | PRN

## 2017-09-22 MED ORDER — OXYCODONE HCL 5 MG PO TABS
ORAL_TABLET | ORAL | Status: AC
  Filled 2017-09-22: qty 1

## 2017-09-22 MED ORDER — LISINOPRIL 20 MG PO TABS
40.0000 mg | ORAL_TABLET | Freq: Every day | ORAL | Status: DC
  Administered 2017-09-22: 40 mg via ORAL
  Filled 2017-09-22: qty 2

## 2017-09-22 MED ORDER — ADULT MULTIVITAMIN W/MINERALS CH: 1.0000 | ORAL_TABLET | Freq: Every day | ORAL | Status: DC

## 2017-09-22 MED ORDER — LIDOCAINE HCL (CARDIAC) PF 100 MG/5ML IV SOSY
PREFILLED_SYRINGE | INTRAVENOUS | Status: DC | PRN
  Administered 2017-09-22: 80 mg via INTRAVENOUS

## 2017-09-22 MED ORDER — PROPOFOL 10 MG/ML IV BOLUS
INTRAVENOUS | Status: AC
  Filled 2017-09-22: qty 20

## 2017-09-22 MED ORDER — OXYCODONE HCL 5 MG PO TABS
5.0000 mg | ORAL_TABLET | Freq: Once | ORAL | Status: AC | PRN
  Administered 2017-09-22: 5 mg via ORAL

## 2017-09-22 MED ORDER — GLYCOPYRROLATE 0.2 MG/ML IJ SOLN
INTRAMUSCULAR | Status: DC | PRN
  Administered 2017-09-22: 0.2 mg via INTRAVENOUS

## 2017-09-22 MED ORDER — EPHEDRINE SULFATE 50 MG/ML IJ SOLN
INTRAMUSCULAR | Status: AC
  Filled 2017-09-22: qty 1

## 2017-09-22 MED ORDER — ROCURONIUM BROMIDE 100 MG/10ML IV SOLN
INTRAVENOUS | Status: DC | PRN
  Administered 2017-09-22 (×2): 20 mg via INTRAVENOUS
  Administered 2017-09-22: 50 mg via INTRAVENOUS

## 2017-09-22 MED ORDER — CHLORHEXIDINE GLUCONATE CLOTH 2 % EX PADS: 6.0000 | MEDICATED_PAD | Freq: Once | CUTANEOUS | Status: DC

## 2017-09-22 MED ORDER — CEFAZOLIN SODIUM-DEXTROSE 1-4 GM/50ML-% IV SOLN: 1.0000 g | Freq: Three times a day (TID) | INTRAVENOUS | Status: DC

## 2017-09-22 MED ORDER — PROMETHAZINE HCL 25 MG/ML IJ SOLN: 6.2500 mg | INTRAMUSCULAR | Status: DC | PRN

## 2017-09-22 MED ORDER — PROPOFOL 10 MG/ML IV BOLUS
INTRAVENOUS | Status: DC | PRN
  Administered 2017-09-22: 150 mg via INTRAVENOUS

## 2017-09-22 MED ORDER — ACETAMINOPHEN 325 MG PO TABS: 650.0000 mg | ORAL_TABLET | ORAL | Status: DC | PRN

## 2017-09-22 MED ORDER — THROMBIN (RECOMBINANT) 20000 UNITS EX SOLR
CUTANEOUS | Status: DC | PRN
  Administered 2017-09-22: 08:00:00 via TOPICAL

## 2017-09-22 MED ORDER — CEFAZOLIN SODIUM-DEXTROSE 2-4 GM/100ML-% IV SOLN
INTRAVENOUS | Status: AC
  Filled 2017-09-22: qty 100

## 2017-09-22 MED ORDER — HYDROMORPHONE HCL 1 MG/ML IJ SOLN: 1.0000 mg | INTRAMUSCULAR | Status: DC | PRN

## 2017-09-22 MED ORDER — ONDANSETRON HCL 4 MG/2ML IJ SOLN: 4.0000 mg | Freq: Four times a day (QID) | INTRAMUSCULAR | Status: DC | PRN

## 2017-09-22 MED ORDER — 0.9 % SODIUM CHLORIDE (POUR BTL) OPTIME
TOPICAL | Status: DC | PRN
  Administered 2017-09-22: 1000 mL

## 2017-09-22 MED ORDER — PHENYLEPHRINE 40 MCG/ML (10ML) SYRINGE FOR IV PUSH (FOR BLOOD PRESSURE SUPPORT)
PREFILLED_SYRINGE | INTRAVENOUS | Status: AC
  Filled 2017-09-22: qty 10

## 2017-09-22 MED ORDER — PANTOPRAZOLE SODIUM 40 MG PO TBEC
40.0000 mg | DELAYED_RELEASE_TABLET | Freq: Every evening | ORAL | Status: DC
  Administered 2017-09-22: 40 mg via ORAL
  Filled 2017-09-22: qty 1

## 2017-09-22 MED ORDER — SODIUM CHLORIDE 0.9% FLUSH
3.0000 mL | Freq: Two times a day (BID) | INTRAVENOUS | Status: DC
  Administered 2017-09-22: 3 mL via INTRAVENOUS

## 2017-09-22 MED ORDER — FENTANYL CITRATE (PF) 250 MCG/5ML IJ SOLN
INTRAMUSCULAR | Status: AC
  Filled 2017-09-22: qty 5

## 2017-09-22 SURGICAL SUPPLY — 56 items
BAG DECANTER FOR FLEXI CONT (MISCELLANEOUS) ×3 IMPLANT
BENZOIN TINCTURE PRP APPL 2/3 (GAUZE/BANDAGES/DRESSINGS) ×3 IMPLANT
BIT DRILL 13 (BIT) ×2 IMPLANT
BIT DRILL 13MM (BIT) ×1
BUR MATCHSTICK NEURO 3.0 LAGG (BURR) ×3 IMPLANT
CAGE PEEK 6X14X11 (Cage) ×8 IMPLANT
CAGE SPNL 11X14X6XRADOPQ (Cage) ×1 IMPLANT
CANISTER SUCT 3000ML PPV (MISCELLANEOUS) ×3 IMPLANT
CARTRIDGE OIL MAESTRO DRILL (MISCELLANEOUS) ×1 IMPLANT
CLOSURE WOUND 1/2 X4 (GAUZE/BANDAGES/DRESSINGS) ×1
DIFFUSER DRILL AIR PNEUMATIC (MISCELLANEOUS) ×3 IMPLANT
DRAPE C-ARM 42X72 X-RAY (DRAPES) ×6 IMPLANT
DRAPE LAPAROTOMY 100X72 PEDS (DRAPES) ×3 IMPLANT
DRAPE MICROSCOPE LEICA (MISCELLANEOUS) ×3 IMPLANT
DRSG OPSITE POSTOP 4X6 (GAUZE/BANDAGES/DRESSINGS) ×3 IMPLANT
DURAPREP 6ML APPLICATOR 50/CS (WOUND CARE) ×3 IMPLANT
ELECT COATED BLADE 2.86 ST (ELECTRODE) ×3 IMPLANT
ELECT REM PT RETURN 9FT ADLT (ELECTROSURGICAL) ×3
ELECTRODE REM PT RTRN 9FT ADLT (ELECTROSURGICAL) ×1 IMPLANT
GAUZE SPONGE 4X4 12PLY STRL (GAUZE/BANDAGES/DRESSINGS) ×3 IMPLANT
GAUZE SPONGE 4X4 16PLY XRAY LF (GAUZE/BANDAGES/DRESSINGS) IMPLANT
GLOVE ECLIPSE 9.0 STRL (GLOVE) ×3 IMPLANT
GLOVE EXAM NITRILE LRG STRL (GLOVE) IMPLANT
GLOVE EXAM NITRILE XL STR (GLOVE) IMPLANT
GLOVE EXAM NITRILE XS STR PU (GLOVE) IMPLANT
GOWN STRL REUS W/ TWL LRG LVL3 (GOWN DISPOSABLE) IMPLANT
GOWN STRL REUS W/ TWL XL LVL3 (GOWN DISPOSABLE) IMPLANT
GOWN STRL REUS W/TWL 2XL LVL3 (GOWN DISPOSABLE) IMPLANT
GOWN STRL REUS W/TWL LRG LVL3 (GOWN DISPOSABLE)
GOWN STRL REUS W/TWL XL LVL3 (GOWN DISPOSABLE)
HALTER HD/CHIN CERV TRACTION D (MISCELLANEOUS) ×3 IMPLANT
HEMOSTAT POWDER KIT SURGIFOAM (HEMOSTASIS) IMPLANT
KIT BASIN OR (CUSTOM PROCEDURE TRAY) ×3 IMPLANT
KIT TURNOVER KIT B (KITS) ×3 IMPLANT
NEEDLE SPNL 20GX3.5 QUINCKE YW (NEEDLE) ×3 IMPLANT
NS IRRIG 1000ML POUR BTL (IV SOLUTION) ×3 IMPLANT
OIL CARTRIDGE MAESTRO DRILL (MISCELLANEOUS) ×3
PACK LAMINECTOMY NEURO (CUSTOM PROCEDURE TRAY) ×3 IMPLANT
PAD ARMBOARD 7.5X6 YLW CONV (MISCELLANEOUS) ×9 IMPLANT
PLATE 4 80XLCK NS SPNE CVD (Plate) ×1 IMPLANT
PLATE 4 ATLANTIS TRANS (Plate) ×2 IMPLANT
RUBBERBAND STERILE (MISCELLANEOUS) ×6 IMPLANT
SCREW 4.5X13MM (Screw) ×3 IMPLANT
SCREW ST FIX 4 ATL 3120213 (Screw) ×27 IMPLANT
SPACER SPNL 11X14X6XPEEK CVD (Cage) ×3 IMPLANT
SPCR SPNL 11X14X6XPEEK CVD (Cage) ×3 IMPLANT
SPONGE INTESTINAL PEANUT (DISPOSABLE) ×3 IMPLANT
SPONGE SURGIFOAM ABS GEL 100 (HEMOSTASIS) ×3 IMPLANT
STRIP CLOSURE SKIN 1/2X4 (GAUZE/BANDAGES/DRESSINGS) ×2 IMPLANT
SUT VIC AB 3-0 SH 8-18 (SUTURE) ×3 IMPLANT
SUT VIC AB 4-0 RB1 18 (SUTURE) ×3 IMPLANT
TAPE CLOTH 4X10 WHT NS (GAUZE/BANDAGES/DRESSINGS) ×3 IMPLANT
TOWEL GREEN STERILE (TOWEL DISPOSABLE) ×3 IMPLANT
TOWEL GREEN STERILE FF (TOWEL DISPOSABLE) ×3 IMPLANT
TRAP SPECIMEN MUCOUS 40CC (MISCELLANEOUS) ×3 IMPLANT
WATER STERILE IRR 1000ML POUR (IV SOLUTION) ×3 IMPLANT

## 2017-09-22 NOTE — Brief Op Note (Signed)
09/22/2017  10:52 AM  PATIENT:  Charles Oconnor  67 y.o. male  PRE-OPERATIVE DIAGNOSIS:  Stenosis  POST-OPERATIVE DIAGNOSIS:  stenosis  PROCEDURE:  Procedure(s): ACDF - C3-C4 - C4-C5 - C5-C6 - C6-C7 (N/A)  SURGEON:  Surgeon(s) and Role:    * Earnie Larsson, MD - Primary  PHYSICIAN ASSISTANT:   ASSISTANTS: Nundkumar   ANESTHESIA:   general  EBL:  150 mL   BLOOD ADMINISTERED:none  DRAINS: none   LOCAL MEDICATIONS USED:  NONE  SPECIMEN:  No Specimen  DISPOSITION OF SPECIMEN:  N/A  COUNTS:  YES  TOURNIQUET:  * No tourniquets in log *  DICTATION: .Dragon Dictation  PLAN OF CARE: Admit to inpatient   PATIENT DISPOSITION:  PACU - hemodynamically stable.   Delay start of Pharmacological VTE agent (>24hrs) due to surgical blood loss or risk of bleeding: yes

## 2017-09-22 NOTE — Progress Notes (Signed)
Report called to [facility], given to [place], Therapist, sports

## 2017-09-22 NOTE — Progress Notes (Signed)
Temporal Temp of 97.6 upon exiting PACU, keyboard numbers not working properly

## 2017-09-22 NOTE — Anesthesia Procedure Notes (Signed)
Procedure Name: Intubation Date/Time: 09/22/2017 8:10 AM Performed by: Inda Coke, CRNA Pre-anesthesia Checklist: Patient identified, Emergency Drugs available, Suction available and Patient being monitored Patient Re-evaluated:Patient Re-evaluated prior to induction Oxygen Delivery Method: Circle System Utilized Preoxygenation: Pre-oxygenation with 100% oxygen Induction Type: IV induction Ventilation: Mask ventilation without difficulty Laryngoscope Size: Mac and 4 Grade View: Grade I Tube type: Oral Number of attempts: 1 Airway Equipment and Method: Stylet and Oral airway Placement Confirmation: ETT inserted through vocal cords under direct vision,  positive ETCO2 and breath sounds checked- equal and bilateral Secured at: 22 cm Tube secured with: Tape Dental Injury: Teeth and Oropharynx as per pre-operative assessment

## 2017-09-22 NOTE — Transfer of Care (Signed)
Immediate Anesthesia Transfer of Care Note  Patient: Charles Oconnor  Procedure(s) Performed: ACDF - C3-C4 - C4-C5 - C5-C6 - C6-C7 (N/A )  Patient Location: PACU  Anesthesia Type:General  Level of Consciousness: awake and alert   Airway & Oxygen Therapy: Patient Spontanous Breathing and Patient connected to nasal cannula oxygen  Post-op Assessment: Report given to RN, Post -op Vital signs reviewed and stable and Patient moving all extremities X 4  Post vital signs: Reviewed and stable  Last Vitals:  Vitals Value Taken Time  BP 131/67 09/22/2017 11:06 AM  Temp    Pulse 81 09/22/2017 11:06 AM  Resp 19 09/22/2017 11:06 AM  SpO2 100 % 09/22/2017 11:06 AM  Vitals shown include unvalidated device data.  Last Pain:  Vitals:   09/22/17 0650  TempSrc:   PainSc: 7       Patients Stated Pain Goal: 3 (30/86/57 8469)  Complications: No apparent anesthesia complications

## 2017-09-22 NOTE — H&P (Signed)
Charles Oconnor is an 67 y.o. male.   Chief Complaint: Neck pain HPI: 67 year old male with chronic and persistently worsening neck pain with radiation into both upper extremities.  Patient has failed conservative management.  Work-up demonstrates evidence of marked multilevel disc deterioration with significant disc space collapse and retrolisthesis at C3-4 and C4-5 and disc space collapse and marked foraminal stenosis at C5-6 and C6-7.  Patient presents now for 4 level anterior cervical decompression and fusion in hopes of improving his symptoms.  Past Medical History:  Diagnosis Date  . Cervical spinal stenosis   . Complication of anesthesia   . Coronary artery disease    mild to moderate  . Dyslipidemia   . ED (erectile dysfunction)   . Hypertension   . PONV (postoperative nausea and vomiting)    Happened once 30 yrs ago. No problems since.  . Prostate cancer [facility])     Past Surgical History:  Procedure Laterality Date  . APPENDECTOMY  1985  . CARDIAC CATHETERIZATION  2002, 2011   mild-mod CAD  . CARDIAC CATHETERIZATION N/A 03/12/2015   Procedure: Left Heart Cath and Coronary Angiography;  Surgeon: Pixie Casino, MD;  Location: [place] [facility];  Service: Cardiovascular;  Laterality: N/A;  . HAND SURGERY Left    had joints fused  . PROSTATECTOMY  03/2007  . TOTAL KNEE ARTHROPLASTY  04/2009    Family History  Problem Relation Age of Onset  . Heart attack Father   . Diabetes Mother   . Heart failure Mother   . Coronary artery disease Mother   . Stroke Mother   . Prostate cancer Brother   . Heart attack Brother        also CVA  . Heart attack Brother        also CVA  . Cancer Brother    Social History:  reports that he has never smoked. He has never used smokeless tobacco. He reports that he does not drink alcohol or use drugs.  Allergies:  Allergies  Allergen Reactions  . Zocor [Simvastatin] Nausea Only and Other (See Comments)    Elevate liver enzymes     Medications Prior to Admission  Medication Sig Dispense Refill  . amLODipine (NORVASC) 10 MG tablet Take 10 mg by mouth daily.    Marland Kitchen aspirin EC 81 MG tablet Take 81 mg by mouth daily.    Marland Kitchen lisinopril-hydrochlorothiazide (PRINZIDE,ZESTORETIC) 20-12.5 MG per tablet Take 2 tablets by mouth daily.     . Multiple Vitamin (MULTIVITAMIN WITH MINERALS) TABS tablet Take 1 tablet by mouth daily.    . naproxen sodium (ANAPROX) 220 MG tablet Take 440 mg by mouth 3 (three) times daily as needed (for pain).     . pantoprazole (PROTONIX) 40 MG tablet Take 40 mg by mouth every evening.     . pravastatin (PRAVACHOL) 80 MG tablet Take 80 mg by mouth at bedtime.     . triamcinolone cream (KENALOG) 0.1 % Apply 1 application topically 2 (two) times daily as needed (for eczema).       No results found for this or any previous visit (from the past 48 hour(s)). No results found.  Pertinent items noted in HPI and remainder of comprehensive ROS otherwise negative.  Blood pressure (!) 141/69, pulse (!) 59, temperature 97.9 F (36.6 C), temperature source Oral, resp. rate 20, SpO2 98 %.  She is awake and alert.  He is oriented and appropriate.  Speech is fluent.  Judgment and insight are intact.  Cranial nerve function normal bilaterally.  Motor examination of the extremities reveals some mild weakness of his left deltoid otherwise motor strength intact.  Sensory examination with some patchy distal sensory loss in both upper extremities.  Reflexes normal.  Examination head ears eyes nose and throat is unremarkable her chest and abdomen are benign.  Extremities are free from injury deformity. Assessment/Plan Multilevel cervical spondylosis with foraminal stenosis and chronic neck and radicular pain.  Plan C3-4, C4-5, C5-6, C6-7 anterior cervical discectomy with interbody fusion utilizing interbody peek cages, locally harvested autograft, and anterior plate instrumentation.  Risks and benefits of been explained.   Patient wishes to proceed.  Charles Oconnor 09/22/2017, 7:39 AM

## 2017-09-22 NOTE — Anesthesia Postprocedure Evaluation (Signed)
Anesthesia Post Note  Patient: Lenon L Sealy  Procedure(s) Performed: ACDF - C3-C4 - C4-C5 - C5-C6 - C6-C7 (N/A )     Patient location during evaluation: PACU Anesthesia Type: General Level of consciousness: awake and alert Pain management: pain level controlled Vital Signs Assessment: post-procedure vital signs reviewed and stable Respiratory status: spontaneous breathing, nonlabored ventilation, respiratory function stable and patient connected to nasal cannula oxygen Cardiovascular status: blood pressure returned to baseline and stable Postop Assessment: no apparent nausea or vomiting Anesthetic complications: no    Last Vitals:  Vitals:   09/22/17 1536 09/22/17 1954  BP: 128/71 (!) 146/75  Pulse: 81 91  Resp: 16 16  Temp: 36.8 C 37.4 C  SpO2: 94% 93%    Last Pain:  Vitals:   09/22/17 1954  TempSrc: Oral  PainSc:                  Ryan P Ellender

## 2017-09-22 NOTE — Anesthesia Preprocedure Evaluation (Addendum)
Anesthesia Evaluation  Patient identified by MRN, date of birth, ID band Patient awake    Reviewed: Allergy & Precautions, NPO status , Patient's Chart, lab work & pertinent test results  History of Anesthesia Complications (+) PONV and history of anesthetic complications  Airway Mallampati: II  TM Distance: >3 FB Neck ROM: Full    Dental no notable dental hx.    Pulmonary neg pulmonary ROS, neg PE   Pulmonary exam normal breath sounds clear to auscultation       Cardiovascular hypertension, Pt. on medications + CAD  Normal cardiovascular exam Rhythm:Regular Rate:Normal  ECG: SB, rate 52  Pre-op eval per cardiology ([facility])   Neuro/Psych negative neurological ROS  negative psych ROS   GI/Hepatic Neg liver ROS, GERD  Medicated and Controlled,  Endo/Other  negative endocrine ROS  Renal/GU negative Renal ROS     Musculoskeletal negative musculoskeletal ROS (+)   Abdominal   Peds  Hematology HLD   Anesthesia Other Findings Cervical spinal stenosis  Reproductive/Obstetrics                            Anesthesia Physical Anesthesia Plan  ASA: II  Anesthesia Plan: General   Post-op Pain Management:    Induction: Intravenous  PONV Risk Score and Plan: 2 and Ondansetron, Dexamethasone, Midazolam and Treatment may vary due to age or medical condition  Airway Management Planned: Oral ETT  Additional Equipment:   Intra-op Plan:   Post-operative Plan: Extubation in OR  Informed Consent: I have reviewed the patients History and Physical, chart, labs and discussed the procedure including the risks, benefits and alternatives for the proposed anesthesia with the patient or authorized representative who has indicated his/her understanding and acceptance.   Dental advisory given  Plan Discussed with: CRNA  Anesthesia Plan Comments:         Anesthesia Quick Evaluation

## 2017-09-22 NOTE — Op Note (Signed)
Date of procedure: 09/22/2017  Date of dictation: Same  Service: Neurosurgery  Preoperative diagnosis: Cervical spondylosis with foraminal stenosis C3-4, C4-5, C5-6, C6-7  Postoperative diagnosis: Same  Procedure Name: C3-4, C4-5, C5-6, C6-7 anterior cervical discectomy with interbody fusion utilizing interbody peek cages, locally harvested autograft, and anterior plate instrumentation  Surgeon:Cipriana Biller A.Rayven Rettig, M.D.  Asst. Surgeon: Kathyrn Sheriff  Anesthesia: General  Indication: 67 year old male with chronic and worsening neck and bilateral upper extremity symptoms failing conservative management her work-up demonstrates evidence of marked cervical disc degeneration with associated disc space collapse, spondylosis and severe foraminal stenosis and moderate central spinal stenosis.  Patient presents now for 4 level anterior cervical decompression and fusion surgery in hopes of improving his symptoms.  Operative note: After induction of anesthesia, patient position supine with neck slightly extended and held place holder traction.  Patient's anterior cervical region prepped and draped sterilely.  Incision made overlying C5.  Dissection performed on the right.  Retractor placed.  Fluoroscopy used.  Levels confirmed.  Disc spaces at C3-4, C4-5, C5-6 and C6-7 were then incised and discectomies were performed using various instruments down to level the posterior annulus.  Microscope was then brought to the field used throughout the remainder of the discectomy.  Remaining aspects of annulus and osteophytes were removed down to level the posterior logical limb.  Posterior logical is not elevated and resected in a piecemeal fashion.  Underlying thecal sac was then identified.  A wide central decompression was then performed undercutting the bodies of C3 and C4.  Decompression then proceeded into each neural foramen.  Wide anterior foraminotomies were performed on the course exiting C4 nerve roots bilaterally.  At  this point a very thorough decompression of been achieved.  There was no evidence of injury to thecal sac and nerve roots.  Procedure was then repeated at C4-5, C5-6 and C6-7 all without complications.  Wound was then irrigated fanlike solution.  Gelfoam was placed topically for hemostasis then removed.  [employer] anatomic peek cages packed with locally harvested autograft was then impacted into place at all 4 levels.  Each cage was recessed slightly from the anterior cortical margin.  Atlantis translational plate was then placed over the C3, C4, C5, C6 levels.  This then attached under fluoroscopic guidance using 13 m fixed angle screws to each at all 5 levels.  Screws given final tightening and locking screws were engaged.  Final images reveal good position of the cages and the hardware at the proper operative level with normal alignment of the spine.  Wound is then irrigated one final time.  Hemostasis was assured with the bipolar cautery.  Wounds and closed in layers of Vicryl sutures.  Steri-Strips and sterile dressing were applied.  No apparent complications.  Patient tolerated the procedure well and he returns to the recovery room postop.

## 2017-09-23 MED ORDER — CYCLOBENZAPRINE HCL 10 MG PO TABS: 10.0000 mg | ORAL_TABLET | Freq: Three times a day (TID) | ORAL | 0 refills | Status: DC | PRN

## 2017-09-23 MED ORDER — HYDROCODONE-ACETAMINOPHEN 5-325 MG PO TABS: 1.0000 | ORAL_TABLET | ORAL | 0 refills | Status: DC | PRN

## 2017-09-23 MED FILL — Thrombin For Soln 20000 Unit: CUTANEOUS | Qty: 1 | Status: AC

## 2017-09-23 NOTE — Evaluation (Signed)
Occupational Therapy Evaluation (late entry) Patient Details Name: Charles Oconnor MRN: [id number] DOB: 14-Jul-1950 Today's Date: 09/23/2017    History of Present Illness Pt is a 67 y/o male who presents s/p C3-C7 ACDF on 09/22/17. PMH significant for prostate CA, HTN, CAD, TKA 2010.   Clinical Impression   PTA, pt was independent with ADL and functional mobility. He was educated concerning cervical precautions related to ADL and functional mobility and demonstrates the ability to complete all ADL and ADL transfers with modified independence. He was educated concerning activity modifications, cervical collar management, and compensatory strategies for ADL participation. Additionally educated concerning shower transfers and safety considerations. Pt demonstrates understanding of all topics and reports no further questions or concerns. He will have 24 hour supervision at home. No further OT needs identified. OT will sign off.    Follow Up Recommendations  No OT follow up;Supervision/Assistance - 24 hour(initial for safety)    Equipment Recommendations       Recommendations for Other Services       Precautions / Restrictions Precautions Precautions: Fall;Cervical Precaution Booklet Issued: Yes (comment) Precaution Comments: Reviewed handout with pt. He was cued for precautions during functional mobility. Required Braces or Orthoses: Cervical Brace Cervical Brace: Soft collar Restrictions Weight Bearing Restrictions: No      Mobility Bed Mobility Overal bed mobility: Needs Assistance Bed Mobility: Rolling;Sidelying to Sit Rolling: Modified independent (Device/Increase time) Sidelying to sit: Modified independent (Device/Increase time)       General bed mobility comments: Increased time but able to complete without difficulty. Has an adjustable bed at home.   Transfers Overall transfer level: Modified independent Equipment used: None             General transfer comment:  Pt demonstrated proper hand placement on seated surface for safety.     Balance Overall balance assessment: Needs assistance Sitting-balance support: Feet supported;No upper extremity supported Sitting balance-Leahy Scale: Normal     Standing balance support: No upper extremity supported;During functional activity Standing balance-Leahy Scale: Fair                             ADL either performed or assessed with clinical judgement   ADL Overall ADL's : Modified independent                                       General ADL Comments: Educated concerning ADL compensatory strategies to maintain cervical precautions as well as ADL transfers and he verbalized understanding.      Vision Baseline Vision/History: Wears glasses Wears Glasses: Reading only Vision Assessment?: No apparent visual deficits     Perception     Praxis      Pertinent Vitals/Pain Pain Assessment: Faces Faces Pain Scale: Hurts a little bit Pain Location: incision Pain Descriptors / Indicators: Operative site guarding Pain Intervention(s): Limited activity within patient's tolerance;Monitored during session;Repositioned     Hand Dominance     Extremity/Trunk Assessment Upper Extremity Assessment Upper Extremity Assessment: Overall WFL for tasks assessed   Lower Extremity Assessment Lower Extremity Assessment: Overall WFL for tasks assessed   Cervical / Trunk Assessment Cervical / Trunk Assessment: Other exceptions Cervical / Trunk Exceptions: s/p surgery. Forward head/rounded shoulder posture   Communication Communication Communication: No difficulties   Cognition Arousal/Alertness: Awake/alert Behavior During Therapy: WFL for tasks assessed/performed Overall Cognitive Status: Within Functional Limits for  tasks assessed                                     General Comments       Exercises     Shoulder Instructions      Home Living  Family/patient expects to be discharged to:: Private residence Living Arrangements: Spouse/significant other Available Help at Discharge: Family;Available 24 hours/day Type of Home: House Home Access: Level entry     Home Layout: One level     Bathroom Shower/Tub: Occupational psychologist: Standard     Home Equipment: None          Prior Functioning/Environment Level of Independence: Independent                 OT Problem List: Decreased activity tolerance;Impaired balance (sitting and/or standing);Decreased knowledge of precautions;Pain      OT Treatment/Interventions:      OT Goals(Current goals can be found in the care plan section) Acute Rehab OT Goals Patient Stated Goal: Home today OT Goal Formulation: With patient  OT Frequency:     Barriers to D/C:            Co-evaluation              AM-PAC PT "6 Clicks" Daily Activity     Outcome Measure Help from another person eating meals?: None Help from another person taking care of personal grooming?: None Help from another person toileting, which includes using toliet, bedpan, or urinal?: None Help from another person bathing (including washing, rinsing, drying)?: None Help from another person to put on and taking off regular upper body clothing?: None Help from another person to put on and taking off regular lower body clothing?: None 6 Click Score: 24   End of Session Equipment Utilized During Treatment: Cervical collar Nurse Communication: Mobility status  Activity Tolerance: Patient tolerated treatment well Patient left: in chair;with call bell/phone within reach  OT Visit Diagnosis: Other abnormalities of gait and mobility (R26.89)                Time: 2482-5003 OT Time Calculation (min): 9 min Charges:  OT General Charges $OT Visit: 1 Visit OT Evaluation $OT Eval Low Complexity: 1 Low G-Codes:     Charles Herrlich, MS OTR/L  Pager: [place] A Leona Alen 09/23/2017,  2:10 PM

## 2017-09-23 NOTE — Discharge Summary (Signed)
Physician Discharge Summary  Patient ID: Charles Oconnor MRN: [id number] DOB/AGE: 01/22/1951 67 y.o.  Admit date: 09/22/2017 Discharge date: 09/23/2017  Admission Diagnoses:  Discharge Diagnoses:  Active Problems:   Cervical spondylosis with radiculopathy   Discharged Condition: good  Hospital Course: Patient admitted to the hospital where he underwent uncomplicated 4 level anterior cervical decompression and fusion.  Postoperatively doing well.  Preoperative neck and upper extremity symptoms much improved.  Standing and ambulating without difficulty.  Tolerating a diet.  Ready for discharge home.  Consults:   Significant Diagnostic Studies:   Treatments:   Discharge Exam: Blood pressure (!) 112/59, pulse (!) 58, temperature 98.8 F (37.1 C), temperature source Oral, resp. rate 16, SpO2 93 %. Awake and alert.  Oriented and appropriate.  Cranial nerve function intact.  Motor and sensory function extremities normal.  Wound clean and dry.  Chest and abdomen benign.  Disposition: Discharge disposition: 01-Home or Self Care        Allergies as of 09/23/2017      Reactions   Zocor [simvastatin] Nausea Only, Other (See Comments)   Elevate liver enzymes      Medication List    TAKE these medications   amLODipine 10 MG tablet Commonly known as:  NORVASC Take 10 mg by mouth daily.   aspirin EC 81 MG tablet Take 81 mg by mouth daily.   cyclobenzaprine 10 MG tablet Commonly known as:  FLEXERIL Take 1 tablet (10 mg total) by mouth 3 (three) times daily as needed for muscle spasms.   HYDROcodone-acetaminophen 5-325 MG tablet Commonly known as:  NORCO/VICODIN Take 1 tablet by mouth every 4 (four) hours as needed for moderate pain ((score 4 to 6)).   lisinopril-hydrochlorothiazide 20-12.5 MG tablet Commonly known as:  PRINZIDE,ZESTORETIC Take 2 tablets by mouth daily.   multivitamin with minerals Tabs tablet Take 1 tablet by mouth daily.   naproxen sodium 220 MG  tablet Commonly known as:  ALEVE Take 440 mg by mouth 3 (three) times daily as needed (for pain).   pantoprazole 40 MG tablet Commonly known as:  PROTONIX Take 40 mg by mouth every evening.   pravastatin 80 MG tablet Commonly known as:  PRAVACHOL Take 80 mg by mouth at bedtime.   triamcinolone cream 0.1 % Commonly known as:  KENALOG Apply 1 application topically 2 (two) times daily as needed (for eczema).        Signed: Cooper Render Katerine Morua 09/23/2017, 9:46 AM

## 2017-09-23 NOTE — Telephone Encounter (Signed)
Spoke with Pt who states he had the procedure done on 09/22/17.

## 2017-09-23 NOTE — Evaluation (Signed)
Physical Therapy Evaluation and Discharge Patient Details Name: Charles Oconnor MRN: [id number] DOB: 03/29/1951 Today's Date: 09/23/2017   History of Present Illness  Pt is a 67 y/o male who presents s/p C3-C7 ACDF on 09/22/17. PMH significant for prostate CA, HTN, CAD, TKA 2010.  Clinical Impression  Patient evaluated by Physical Therapy with no further acute PT needs identified. All education has been completed and the patient has no further questions. At the time of PT eval pt was able to perform transfers and ambulation with gross modified independence. One instance of mild LOB however pt was able to recover independently. Overall safe for d/c home with family support. Pt was educated on precautions, brace application, car transfer, and general safety with activity progression. See below for any follow-up Physical Therapy or equipment needs. PT is signing off. Thank you for this referral.     Follow Up Recommendations No PT follow up;Supervision - Intermittent    Equipment Recommendations  None recommended by PT    Recommendations for Other Services       Precautions / Restrictions Precautions Precautions: Fall;Cervical Precaution Booklet Issued: Yes (comment) Precaution Comments: Reviewed handout with pt. He was cued for precautions during functional mobility. Required Braces or Orthoses: Cervical Brace Cervical Brace: Soft collar Restrictions Weight Bearing Restrictions: No      Mobility  Bed Mobility Overal bed mobility: Needs Assistance Bed Mobility: Rolling;Sidelying to Sit Rolling: Modified independent (Device/Increase time) Sidelying to sit: Modified independent (Device/Increase time)       General bed mobility comments: Increased time but able to complete without difficulty. Has an adjustable bed at home.   Transfers Overall transfer level: Modified independent Equipment used: None             General transfer comment: Pt demonstrated proper hand placement  on seated surface for safety.   Ambulation/Gait Ambulation/Gait assistance: Modified independent (Device/Increase time) Ambulation Distance (Feet): 400 Feet Assistive device: Rolling walker (2 wheeled) Gait Pattern/deviations: Step-through pattern;Trunk flexed Gait velocity: Decreased Gait velocity interpretation: 1.31 - 2.62 ft/sec, indicative of limited community ambulator General Gait Details: Slightly slowed gait speed with generally flexed posture. Able to briefly correct posture with cues but unable to maintain. Noted one instance of mild LOB when pt turned very quickly at the end of the hall but did not require assistance to recover and was able to verbalize that he needed to take turns slower.   Stairs            Wheelchair Mobility    Modified Rankin (Stroke Patients Only)       Balance Overall balance assessment: Needs assistance Sitting-balance support: Feet supported;No upper extremity supported Sitting balance-Leahy Scale: Normal     Standing balance support: No upper extremity supported;During functional activity Standing balance-Leahy Scale: Fair                               Pertinent Vitals/Pain Pain Assessment: Faces Faces Pain Scale: Hurts a little bit Pain Location: incision Pain Descriptors / Indicators: Operative site guarding Pain Intervention(s): Limited activity within patient's tolerance;Monitored during session;Repositioned    Home Living Family/patient expects to be discharged to:: Private residence Living Arrangements: Spouse/significant other Available Help at Discharge: Family;Available 24 hours/day Type of Home: House Home Access: Level entry     Home Layout: One level Home Equipment: None      Prior Function Level of Independence: Independent  Hand Dominance        Extremity/Trunk Assessment   Upper Extremity Assessment Upper Extremity Assessment: Defer to OT evaluation    Lower  Extremity Assessment Lower Extremity Assessment: Overall WFL for tasks assessed    Cervical / Trunk Assessment Cervical / Trunk Assessment: Other exceptions Cervical / Trunk Exceptions: s/p surgery. Forward head/rounded shoulder posture  Communication   Communication: No difficulties  Cognition Arousal/Alertness: Awake/alert Behavior During Therapy: WFL for tasks assessed/performed Overall Cognitive Status: Within Functional Limits for tasks assessed                                        General Comments      Exercises     Assessment/Plan    PT Assessment Patent does not need any further PT services  PT Problem List         PT Treatment Interventions      PT Goals (Current goals can be found in the Care Plan section)  Acute Rehab PT Goals Patient Stated Goal: Home today PT Goal Formulation: All assessment and education complete, DC therapy    Frequency     Barriers to discharge        Co-evaluation               AM-PAC PT "6 Clicks" Daily Activity  Outcome Measure Difficulty turning over in bed (including adjusting bedclothes, sheets and blankets)?: None Difficulty moving from lying on back to sitting on the side of the bed? : None Difficulty sitting down on and standing up from a chair with arms (e.g., wheelchair, bedside commode, etc,.)?: None Help needed moving to and from a bed to chair (including a wheelchair)?: A Little Help needed walking in hospital room?: A Little Help needed climbing 3-5 steps with a railing? : A Little 6 Click Score: 21    End of Session Equipment Utilized During Treatment: Gait belt;Cervical collar Activity Tolerance: Patient tolerated treatment well Patient left: in chair;with call bell/phone within reach Nurse Communication: Mobility status PT Visit Diagnosis: Pain;Other symptoms and signs involving the nervous system (R29.898) Pain - part of body: (neck)    Time: 4097-3532 PT Time Calculation (min)  (ACUTE ONLY): 14 min   Charges:   PT Evaluation $PT Eval Moderate Complexity: 1 Mod     PT G Codes:        Rolinda Roan, PT, DPT Acute Rehabilitation Services Pager: [phone]   Thelma Comp 09/23/2017, 12:57 PM

## 2017-09-23 NOTE — Progress Notes (Signed)
Patient alert and oriented, mae's well, voiding adequate amount of urine, swallowing without difficulty, no c/o pain at time of discharge. Patient discharged home with family. Script and discharged instructions given to patient. Patient and family stated understanding of instructions given. Patient has an appointment with Dr. Pool  

## 2017-09-23 NOTE — Discharge Instructions (Signed)

## 2017-09-24 ENCOUNTER — Emergency Department (HOSPITAL_COMMUNITY): Payer: Medicare Other

## 2017-09-24 ENCOUNTER — Other Ambulatory Visit: Payer: Self-pay

## 2017-09-24 ENCOUNTER — Observation Stay (HOSPITAL_COMMUNITY)
Admission: EM | Admit: 2017-09-24 | Discharge: 2017-09-25 | Disposition: A | Discharge status: Home / Self Care | Payer: Medicare Other | Attending: Neurosurgery | Admitting: Neurosurgery

## 2017-09-24 ENCOUNTER — Encounter (HOSPITAL_COMMUNITY): Payer: Self-pay | Admitting: Neurosurgery

## 2017-09-24 DIAGNOSIS — I10 Essential (primary) hypertension: Secondary | ICD-10-CM | POA: Diagnosis not present

## 2017-09-24 DIAGNOSIS — R0603 Acute respiratory distress: Secondary | ICD-10-CM | POA: Diagnosis not present

## 2017-09-24 DIAGNOSIS — J9811 Atelectasis: Secondary | ICD-10-CM | POA: Diagnosis not present

## 2017-09-24 DIAGNOSIS — R0602 Shortness of breath: Secondary | ICD-10-CM

## 2017-09-24 DIAGNOSIS — R1310 Dysphagia, unspecified: Secondary | ICD-10-CM | POA: Diagnosis not present

## 2017-09-24 DIAGNOSIS — M542 Cervicalgia: Secondary | ICD-10-CM | POA: Diagnosis not present

## 2017-09-24 DIAGNOSIS — Z96659 Presence of unspecified artificial knee joint: Secondary | ICD-10-CM | POA: Diagnosis not present

## 2017-09-24 DIAGNOSIS — Z79899 Other long term (current) drug therapy: Secondary | ICD-10-CM | POA: Insufficient documentation

## 2017-09-24 DIAGNOSIS — I2510 Atherosclerotic heart disease of native coronary artery without angina pectoris: Secondary | ICD-10-CM | POA: Diagnosis not present

## 2017-09-24 DIAGNOSIS — Z7982 Long term (current) use of aspirin: Secondary | ICD-10-CM | POA: Diagnosis not present

## 2017-09-24 DIAGNOSIS — Z8546 Personal history of malignant neoplasm of prostate: Secondary | ICD-10-CM | POA: Insufficient documentation

## 2017-09-24 DIAGNOSIS — R0989 Other specified symptoms and signs involving the circulatory and respiratory systems: Secondary | ICD-10-CM | POA: Diagnosis not present

## 2017-09-24 DIAGNOSIS — J9589 Other postprocedural complications and disorders of respiratory system, not elsewhere classified: Secondary | ICD-10-CM | POA: Diagnosis present

## 2017-09-24 LAB — BASIC METABOLIC PANEL
ANION GAP: 9 (ref 5–15)
BUN: 15 mg/dL (ref 6–20)
CO2: 26 mmol/L (ref 22–32)
CREATININE: 0.96 mg/dL (ref 0.61–1.24)
Calcium: 9.5 mg/dL (ref 8.9–10.3)
Chloride: 102 mmol/L (ref 101–111)
GFR calc Af Amer: >60 mL/min (ref >60)
GLUCOSE: 130 mg/dL — AB (ref 65–99)
Potassium: 3.9 mmol/L (ref 3.5–5.1)
Sodium: 137 mmol/L (ref 135–145)

## 2017-09-24 LAB — CBC WITH DIFFERENTIAL/PLATELET
Abs Immature Granulocytes: 0.1 10*3/uL (ref 0.0–0.1)
BASOS PCT: 1 %
Basophils Absolute: 0.1 10*3/uL (ref 0.0–0.1)
EOS ABS: 0.2 10*3/uL (ref 0.0–0.7)
EOS PCT: 2 %
HEMATOCRIT: 41.2 % (ref 39.0–52.0)
Hemoglobin: 13.8 g/dL (ref 13.0–17.0)
Immature Granulocytes: 1 %
LYMPHS ABS: 1.6 10*3/uL (ref 0.7–4.0)
Lymphocytes Relative: 12 %
MCH: 29.7 pg (ref 26.0–34.0)
MCHC: 33.5 g/dL (ref 30.0–36.0)
MCV: 88.8 fL (ref 78.0–100.0)
MONOS PCT: 9 %
Monocytes Absolute: 1.2 10*3/uL — ABNORMAL HIGH (ref 0.1–1.0)
Neutro Abs: 9.7 10*3/uL — ABNORMAL HIGH (ref 1.7–7.7)
Neutrophils Relative %: 75 %
PLATELETS: 211 10*3/uL (ref 150–400)
RBC: 4.64 MIL/uL (ref 4.22–5.81)
RDW: 12.9 % (ref 11.5–15.5)
WBC: 12.8 10*3/uL — AB (ref 4.0–10.5)

## 2017-09-24 MED ORDER — PRAVASTATIN SODIUM 40 MG PO TABS
80.0000 mg | ORAL_TABLET | Freq: Every day | ORAL | Status: DC
  Administered 2017-09-24: 80 mg via ORAL
  Filled 2017-09-24: qty 2

## 2017-09-24 MED ORDER — DEXAMETHASONE SODIUM PHOSPHATE 10 MG/ML IJ SOLN
10.0000 mg | Freq: Once | INTRAMUSCULAR | Status: AC
  Administered 2017-09-24: 10 mg via INTRAVENOUS
  Filled 2017-09-24: qty 1

## 2017-09-24 MED ORDER — SODIUM CHLORIDE 0.9% FLUSH
3.0000 mL | Freq: Two times a day (BID) | INTRAVENOUS | Status: DC
  Administered 2017-09-24 – 2017-09-25 (×2): 3 mL via INTRAVENOUS

## 2017-09-24 MED ORDER — PANTOPRAZOLE SODIUM 40 MG PO TBEC
40.0000 mg | DELAYED_RELEASE_TABLET | Freq: Every evening | ORAL | Status: DC
  Administered 2017-09-25: 40 mg via ORAL
  Filled 2017-09-24: qty 1

## 2017-09-24 MED ORDER — MORPHINE SULFATE (PF) 4 MG/ML IV SOLN
4.0000 mg | Freq: Once | INTRAVENOUS | Status: AC
  Administered 2017-09-24: 4 mg via INTRAVENOUS
  Filled 2017-09-24: qty 1

## 2017-09-24 MED ORDER — MORPHINE SULFATE (PF) 4 MG/ML IV SOLN: 4.0000 mg | Freq: Once | INTRAVENOUS | Status: DC

## 2017-09-24 MED ORDER — HYDROCODONE-ACETAMINOPHEN 5-325 MG PO TABS
1.0000 | ORAL_TABLET | ORAL | Status: DC | PRN
  Administered 2017-09-24: 1 via ORAL
  Filled 2017-09-24: qty 1

## 2017-09-24 MED ORDER — LISINOPRIL 20 MG PO TABS
40.0000 mg | ORAL_TABLET | Freq: Every day | ORAL | Status: DC
  Administered 2017-09-25: 40 mg via ORAL
  Filled 2017-09-24: qty 2

## 2017-09-24 MED ORDER — DEXAMETHASONE SODIUM PHOSPHATE 10 MG/ML IJ SOLN
10.0000 mg | Freq: Four times a day (QID) | INTRAMUSCULAR | Status: DC
  Administered 2017-09-24 – 2017-09-25 (×5): 10 mg via INTRAVENOUS
  Filled 2017-09-24 (×5): qty 1

## 2017-09-24 MED ORDER — HYDROCHLOROTHIAZIDE 25 MG PO TABS
25.0000 mg | ORAL_TABLET | Freq: Every day | ORAL | Status: DC
  Administered 2017-09-25: 25 mg via ORAL
  Filled 2017-09-24: qty 1

## 2017-09-24 MED ORDER — ADULT MULTIVITAMIN W/MINERALS CH
1.0000 | ORAL_TABLET | Freq: Every day | ORAL | Status: DC
  Administered 2017-09-25: 1 via ORAL
  Filled 2017-09-24: qty 1

## 2017-09-24 MED ORDER — SODIUM CHLORIDE 0.9% FLUSH: 3.0000 mL | INTRAVENOUS | Status: DC | PRN

## 2017-09-24 MED ORDER — CYCLOBENZAPRINE HCL 10 MG PO TABS
10.0000 mg | ORAL_TABLET | Freq: Three times a day (TID) | ORAL | Status: DC | PRN
  Administered 2017-09-24: 10 mg via ORAL
  Filled 2017-09-24: qty 1

## 2017-09-24 MED ORDER — SODIUM CHLORIDE 0.9 % IV SOLN: 250.0000 mL | INTRAVENOUS | Status: DC | PRN

## 2017-09-24 MED ORDER — IOPAMIDOL (ISOVUE-370) INJECTION 76%
50.0000 mL | Freq: Once | INTRAVENOUS | Status: AC | PRN
  Administered 2017-09-24: 50 mL via INTRAVENOUS

## 2017-09-24 MED ORDER — IOPAMIDOL (ISOVUE-370) INJECTION 76%
INTRAVENOUS | Status: AC
  Filled 2017-09-24: qty 50

## 2017-09-24 MED ORDER — LISINOPRIL-HYDROCHLOROTHIAZIDE 20-12.5 MG PO TABS: 2.0000 | ORAL_TABLET | Freq: Every day | ORAL | Status: DC

## 2017-09-24 MED ORDER — AMLODIPINE BESYLATE 10 MG PO TABS
10.0000 mg | ORAL_TABLET | Freq: Every day | ORAL | Status: DC
  Administered 2017-09-25: 10 mg via ORAL
  Filled 2017-09-24: qty 1

## 2017-09-24 MED ORDER — ONDANSETRON HCL 4 MG/2ML IJ SOLN
4.0000 mg | Freq: Once | INTRAMUSCULAR | Status: AC
  Administered 2017-09-24: 4 mg via INTRAVENOUS
  Filled 2017-09-24: qty 2

## 2017-09-24 NOTE — H&P (Signed)
Charles Oconnor is an 67 y.o. male.   Chief Complaint: Trouble breathing HPI: 67 year old male recently status post 4 level anterior cervical decompression and fusion.  Postoperative patient has been doing well but he does note some difficulty catching his breath.  Patient states that he feels his throat is closing off and he has difficulty breathing.  He has no stridor.  He is swallowing well.  He has no new neck or upper extremity symptoms.  He had a CT scan of his neck which demonstrates no evidence of postoperative hemorrhage but does demonstrate some upper airway narrowing secondary to edema.  Past Medical History:  Diagnosis Date  . Cervical spinal stenosis   . Complication of anesthesia   . Coronary artery disease    mild to moderate  . Dyslipidemia   . ED (erectile dysfunction)   . Hypertension   . PONV (postoperative nausea and vomiting)    Happened once 30 yrs ago. No problems since.  . Prostate cancer [facility])     Past Surgical History:  Procedure Laterality Date  . APPENDECTOMY  1985  . CARDIAC CATHETERIZATION  2002, 2011   mild-mod CAD  . CARDIAC CATHETERIZATION N/A 03/12/2015   Procedure: Left Heart Cath and Coronary Angiography;  Surgeon: Pixie Casino, MD;  Location: [facility];  Service: Cardiovascular;  Laterality: N/A;  . HAND SURGERY Left    had joints fused  . PROSTATECTOMY  03/2007  . TOTAL KNEE ARTHROPLASTY  04/2009    Family History  Problem Relation Age of Onset  . Heart attack Father   . Diabetes Mother   . Heart failure Mother   . Coronary artery disease Mother   . Stroke Mother   . Prostate cancer Brother   . Heart attack Brother        also CVA  . Heart attack Brother        also CVA  . Cancer Brother    Social History:  reports that he has never smoked. He has never used smokeless tobacco. He reports that he does not drink alcohol or use drugs.  Allergies:  Allergies  Allergen Reactions  . Zocor [Simvastatin] Nausea Only and  Other (See Comments)    Elevate liver enzymes     (Not in a hospital admission)  Results for orders placed or performed during the hospital encounter of 09/24/17 (from the past 48 hour(s))  CBC with Differential     Status: Abnormal   Collection Time: 09/24/17 11:02 AM  Result Value Ref Range   WBC 12.8 (H) 4.0 - 10.5 K/uL   RBC 4.64 4.22 - 5.81 MIL/uL   Hemoglobin 13.8 13.0 - 17.0 g/dL   HCT 41.2 39.0 - 52.0 %   MCV 88.8 78.0 - 100.0 fL   MCH 29.7 26.0 - 34.0 pg   MCHC 33.5 30.0 - 36.0 g/dL   RDW 12.9 11.5 - 15.5 %   Platelets 211 150 - 400 K/uL   Neutrophils Relative % 75 %   Neutro Abs 9.7 (H) 1.7 - 7.7 K/uL   Lymphocytes Relative 12 %   Lymphs Abs 1.6 0.7 - 4.0 K/uL   Monocytes Relative 9 %   Monocytes Absolute 1.2 (H) 0.1 - 1.0 K/uL   Eosinophils Relative 2 %   Eosinophils Absolute 0.2 0.0 - 0.7 K/uL   Basophils Relative 1 %   Basophils Absolute 0.1 0.0 - 0.1 K/uL   Immature Granulocytes 1 %   Abs Immature Granulocytes 0.1 0.0 -  0.1 K/uL    Comment: Performed at [facility], [place] [address], [place], [place] [address]  Basic metabolic panel     Status: Abnormal   Collection Time: 09/24/17 11:02 AM  Result Value Ref Range   Sodium 137 135 - 145 mmol/L   Potassium 3.9 3.5 - 5.1 mmol/L   Chloride 102 101 - 111 mmol/L   CO2 26 22 - 32 mmol/L   Glucose, Bld 130 (H) 65 - 99 mg/dL   BUN 15 6 - 20 mg/dL   Creatinine, Ser 0.96 0.61 - 1.24 mg/dL   Calcium 9.5 8.9 - 10.3 mg/dL   GFR calc non Af Amer >60 >60 mL/min   GFR calc Af Amer >60 >60 mL/min    Comment: (NOTE) The eGFR has been calculated using the CKD EPI equation. This calculation has not been validated in all clinical situations. eGFR's persistently <60 mL/min signify possible Chronic Kidney Disease.    Anion gap 9 5 - 15    Comment: Performed at [facility] [address], [place], [place] [address]   Dg Neck Soft Tissue  Result Date: 09/24/2017 CLINICAL DATA:  Two days post neck surgery.  Difficulty swallowing. Sore throat. Shortness of breath. EXAM: NECK SOFT TISSUES - 1+ VIEW COMPARISON:  09/22/2017. FINDINGS: Prior C3 through C7 anterior and interbody fusion. Hardware intact. Anatomic alignment. No acute bony abnormality. Carotid vascular calcification. Pulmonary apices are clear. IMPRESSION: 1. Prior C3 through C7 anterior and interbody fusion. Hardware intact. Anatomic alignment. No acute bony abnormality. 2.  Bilateral carotid vascular disease. Electronically Signed   By: Marcello Moores  Register   On: 09/24/2017 12:39   Dg Chest 2 View  Result Date: 09/24/2017 CLINICAL DATA:  Throat tightness and neck pain after cervical spine surgery. EXAM: CHEST - 2 VIEW COMPARISON:  08/30/2016. FINDINGS: Mediastinum and hilar structures normal. Heart size stable. Mild bibasilar subsegmental atelectasis. No pleural effusion or pneumothorax. Stable elevation right hemidiaphragm. Prior cervical spine fusion. IMPRESSION: Mild bibasilar subsegmental atelectasis.  Exam otherwise stable. Electronically Signed   By: Marcello Moores  Register   On: 09/24/2017 10:42   Ct Angio Neck W And/or Wo Contrast  Result Date: 09/24/2017 CLINICAL DATA:  Choking sensation and shortness of breath. ACDF yesterday. EXAM: CT ANGIOGRAPHY NECK TECHNIQUE: Multidetector CT imaging of the neck was performed using the standard protocol during bolus administration of intravenous contrast. Multiplanar CT image reconstructions and MIPs were obtained to evaluate the vascular anatomy. Carotid stenosis measurements (when applicable) are obtained utilizing NASCET criteria, using the distal internal carotid diameter as the denominator. CONTRAST:  29m ISOVUE-370 IOPAMIDOL (ISOVUE-370) INJECTION 76% COMPARISON:  Cervical MRI 04/13/2017 FINDINGS: Aortic arch: Normal. Right carotid system: No evidence of dissection or other injury. Mild atherosclerotic plaque at the bifurcation. No stenosis or ulceration. Left carotid system: No evidence of dissection or  other injury. Mild atherosclerotic plaque at the bifurcation. No ulceration. Vertebral arteries: No proximal subclavian stenosis. Calcified plaque at the right vertebral origin. Both vertebral arteries are smooth and widely patent to the dura. Skeleton: C3-4, C4-5, C5-6, and C6-7 ACDF. The ventral plate and intervertebral cages are located. No evidence of fracture or abnormal subsidence. Other neck: Retropharyngeal fluid and scattered gas, nonspecific after surgery. Swelling narrows the supraglottic laryngeal airway which measures at minimum 4 mm between the epiglottis and posterior pharyngeal wall. Upper chest: Small volume gas reaches the upper mediastinum. On the scout image there is low volume chest with asymmetric elevation of the right diaphragm. Streaky atelectasis at  the bases. IMPRESSION: 1. No evidence of arterial injury. 2. Retropharyngeal fluid/swelling narrows the supraglottic laryngeal airway. No evidence of active hemorrhage. 3. C3-C7 ACDF with plate.  Stable hardware positioning. 4. Low volume chest with atelectasis. Electronically Signed   By: Monte Fantasia M.D.   On: 09/24/2017 15:38    Pertinent items noted in HPI and remainder of comprehensive ROS otherwise negative.  Blood pressure (!) 150/84, pulse 92, temperature 99.3 F (37.4 C), temperature source Oral, resp. rate 18, SpO2 91 %.  The patient is awake and alert.  He is open oriented and appropriate.  He does not appear to be in distress.  Examination of his neck finds his wound to be soft.  His airway is midline.  There is no evidence of hemorrhage.  There is no stridor.  Breath sounds are equal bilaterally.  Motor and sensory function extremities normal. Assessment/Plan Postoperative upper airway swelling.  Plan admission for observation.  Treatment with IV Decadron.  Probable discharge home in 24 to 48 hours. Mallie Mussel A Will Heinkel 09/24/2017, 6:07 PM

## 2017-09-24 NOTE — ED Notes (Signed)
Patient states he had cervical surgery on Tues, was discharged yest. States he wasn't able to sleep very well last pm,  He states he was afraid if he went to sleep he wouldn't wake up. Presently able to speak in complete sentences however constantly clearing throat. He is able to swallow water and maintain his own salvia. C/o pain along suture line

## 2017-09-24 NOTE — ED Provider Notes (Signed)
[facility] DEPART[facility] Provider Note   CSN: [id number] Arrival date & time: 09/24/17  0086     History   Chief Complaint Chief Complaint  Patient presents with  . Neck Pain  . Shortness of Breath    HPI Charles Oconnor is a 67 y.o. male.  HPI  Patient is a 67 year old male with a past medical history of hypertension, CAD, cervical spondylosis with surgery on Tuesday with orthopedics who comes in today complaining of neck pain shortness of breath.  Patient states his pain is located at the incision site which is on the right inferior portion of the neck.  Patient was discharged in the hospital yesterday.  Patient denies fevers but endorses chills, denies nausea vomiting inability to swallow.  Patient does endorse some mild change in voice.  Patient is able to maintain his own secretions but states that he feels like he has been "clearing my throat" more frequently.  Patient also endorses new cough as well as productive sputum.  Patient states that he has had some chest pain believes this is secondary to GERD as it is relieved by belching.  Patient states that he has shortness of breath, but denies CP.  Past Medical History:  Diagnosis Date  . Cervical spinal stenosis   . Complication of anesthesia   . Coronary artery disease    mild to moderate  . Dyslipidemia   . ED (erectile dysfunction)   . Hypertension   . PONV (postoperative nausea and vomiting)    Happened once 30 yrs ago. No problems since.  . Prostate cancer [facility])     Patient Active Problem List   Diagnosis Date Noted  . Cervical spondylosis with radiculopathy 09/22/2017  . Viral URI with cough 04/01/2016  . Chest pain, unspecified   . HTN (hypertension) 01/13/2013  . Dyslipidemia 01/13/2013  . CAD (coronary artery disease) 01/13/2013    Past Surgical History:  Procedure Laterality Date  . APPENDECTOMY  1985  . CARDIAC CATHETERIZATION  2002, 2011   mild-mod CAD  . CARDIAC  CATHETERIZATION N/A 03/12/2015   Procedure: Left Heart Cath and Coronary Angiography;  Surgeon: Pixie Casino, MD;  Location: [facility];  Service: Cardiovascular;  Laterality: N/A;  . HAND SURGERY Left    had joints fused  . PROSTATECTOMY  03/2007  . TOTAL KNEE ARTHROPLASTY  04/2009        Home Medications    Prior to Admission medications   Medication Sig Start Date End Date Taking? Authorizing Provider  amLODipine (NORVASC) 10 MG tablet Take 10 mg by mouth daily.   Yes [provider]  aspirin EC 81 MG tablet Take 81 mg by mouth daily.   Yes [provider]  cyclobenzaprine (FLEXERIL) 10 MG tablet Take 1 tablet (10 mg total) by mouth 3 (three) times daily as needed for muscle spasms. 09/23/17  Yes Pool, Mallie Mussel, MD  HYDROcodone-acetaminophen (NORCO/VICODIN) 5-325 MG tablet Take 1 tablet by mouth every 4 (four) hours as needed for moderate pain ((score 4 to 6)). 09/23/17  Yes Pool, Mallie Mussel, MD  lisinopril-hydrochlorothiazide (PRINZIDE,ZESTORETIC) 20-12.5 MG per tablet Take 2 tablets by mouth daily.    Yes [provider]  Multiple Vitamin (MULTIVITAMIN WITH MINERALS) TABS tablet Take 1 tablet by mouth daily.   Yes [provider]  naproxen sodium (ANAPROX) 220 MG tablet Take 440 mg by mouth 3 (three) times daily as needed (for pain).    Yes [provider]  pantoprazole (PROTONIX)  40 MG tablet Take 40 mg by mouth every evening.    Yes [provider]  pravastatin (PRAVACHOL) 80 MG tablet Take 80 mg by mouth at bedtime.  12/22/14  Yes [provider]  triamcinolone cream (KENALOG) 0.1 % Apply 1 application topically 2 (two) times daily as needed (for eczema).  07/31/16  Yes [provider]    Family History Family History  Problem Relation Age of Onset  . Heart attack Father   . Diabetes Mother   . Heart failure Mother   . Coronary artery disease Mother   . Stroke Mother   . Prostate cancer Brother   .  Heart attack Brother        also CVA  . Heart attack Brother        also CVA  . Cancer Brother     Social History Social History   Tobacco Use  . Smoking status: Never Smoker  . Smokeless tobacco: Never Used  Substance Use Topics  . Alcohol use: No  . Drug use: No     Allergies   Zocor [simvastatin]   Review of Systems Review of Systems  Constitutional: Positive for chills. Negative for fever.  HENT: Positive for sore throat and voice change. Negative for trouble swallowing.   Respiratory: Positive for cough and shortness of breath. Negative for stridor.   All other systems reviewed and are negative.    Physical Exam Updated Vital Signs BP (!) 150/84   Pulse 92   Temp 99.3 F (37.4 C) (Oral)   Resp 18   SpO2 91%   Physical Exam  Constitutional: He appears well-developed and well-nourished.  HENT:  Head: Normocephalic and atraumatic.  Eyes: Conjunctivae are normal.  Neck: Neck supple.  Cardiovascular: Normal rate and regular rhythm.  No murmur heard. Pulmonary/Chest: Effort normal and breath sounds normal. No stridor. No tachypnea. No respiratory distress.  Abdominal: Soft. There is no tenderness.  Musculoskeletal: He exhibits no edema.  Neurological: He is alert.  Skin: Skin is warm and dry.  Incision is clean dry and intact with mild erythema surrounding.  Psychiatric: He has a normal mood and affect.  Nursing note and vitals reviewed.    ED Treatments / Results  Labs (all labs ordered are listed, but only abnormal results are displayed) Labs Reviewed  CBC WITH DIFFERENTIAL/PLATELET - Abnormal; Notable for the following components:      Result Value   WBC 12.8 (*)    Neutro Abs 9.7 (*)    Monocytes Absolute 1.2 (*)    All other components within normal limits  BASIC METABOLIC PANEL - Abnormal; Notable for the following components:   Glucose, Bld 130 (*)    All other components within normal limits    EKG None  Radiology Dg Neck Soft  Tissue  Result Date: 09/24/2017 CLINICAL DATA:  Two days post neck surgery. Difficulty swallowing. Sore throat. Shortness of breath. EXAM: NECK SOFT TISSUES - 1+ VIEW COMPARISON:  09/22/2017. FINDINGS: Prior C3 through C7 anterior and interbody fusion. Hardware intact. Anatomic alignment. No acute bony abnormality. Carotid vascular calcification. Pulmonary apices are clear. IMPRESSION: 1. Prior C3 through C7 anterior and interbody fusion. Hardware intact. Anatomic alignment. No acute bony abnormality. 2.  Bilateral carotid vascular disease. Electronically Signed   By: Marcello Moores  Register   On: 09/24/2017 12:39   Dg Chest 2 View  Result Date: 09/24/2017 CLINICAL DATA:  Throat tightness and neck pain after cervical spine surgery. EXAM: CHEST - 2 VIEW COMPARISON:  08/30/2016. FINDINGS: Mediastinum and hilar structures normal. Heart size stable. Mild bibasilar subsegmental atelectasis. No pleural effusion or pneumothorax. Stable elevation right hemidiaphragm. Prior cervical spine fusion. IMPRESSION: Mild bibasilar subsegmental atelectasis.  Exam otherwise stable. Electronically Signed   By: Marcello Moores  Register   On: 09/24/2017 10:42   Ct Angio Neck W And/or Wo Contrast  Result Date: 09/24/2017 CLINICAL DATA:  Choking sensation and shortness of breath. ACDF yesterday. EXAM: CT ANGIOGRAPHY NECK TECHNIQUE: Multidetector CT imaging of the neck was performed using the standard protocol during bolus administration of intravenous contrast. Multiplanar CT image reconstructions and MIPs were obtained to evaluate the vascular anatomy. Carotid stenosis measurements (when applicable) are obtained utilizing NASCET criteria, using the distal internal carotid diameter as the denominator. CONTRAST:  86mL ISOVUE-370 IOPAMIDOL (ISOVUE-370) INJECTION 76% COMPARISON:  Cervical MRI 04/13/2017 FINDINGS: Aortic arch: Normal. Right carotid system: No evidence of dissection or other injury. Mild atherosclerotic plaque at the bifurcation. No  stenosis or ulceration. Left carotid system: No evidence of dissection or other injury. Mild atherosclerotic plaque at the bifurcation. No ulceration. Vertebral arteries: No proximal subclavian stenosis. Calcified plaque at the right vertebral origin. Both vertebral arteries are smooth and widely patent to the dura. Skeleton: C3-4, C4-5, C5-6, and C6-7 ACDF. The ventral plate and intervertebral cages are located. No evidence of fracture or abnormal subsidence. Other neck: Retropharyngeal fluid and scattered gas, nonspecific after surgery. Swelling narrows the supraglottic laryngeal airway which measures at minimum 4 mm between the epiglottis and posterior pharyngeal wall. Upper chest: Small volume gas reaches the upper mediastinum. On the scout image there is low volume chest with asymmetric elevation of the right diaphragm. Streaky atelectasis at the bases. IMPRESSION: 1. No evidence of arterial injury. 2. Retropharyngeal fluid/swelling narrows the supraglottic laryngeal airway. No evidence of active hemorrhage. 3. C3-C7 ACDF with plate.  Stable hardware positioning. 4. Low volume chest with atelectasis. Electronically Signed   By: Monte Fantasia M.D.   On: 09/24/2017 15:38    Procedures Procedures (including critical care time)  Medications Ordered in ED Medications  iopamidol (ISOVUE-370) 76 % injection (has no administration in time range)  morphine 4 MG/ML injection 4 mg (has no administration in time range)  morphine 4 MG/ML injection 4 mg (4 mg Intravenous Given 09/24/17 1256)  ondansetron (ZOFRAN) injection 4 mg (4 mg Intravenous Given 09/24/17 1256)  dexamethasone (DECADRON) injection 10 mg (10 mg Intravenous Given 09/24/17 1357)  iopamidol (ISOVUE-370) 76 % injection 50 mL (50 mLs Intravenous Contrast Given 09/24/17 1455)     Initial Impression / Assessment and Plan / ED Course  I have reviewed the triage vital signs and the nursing notes.  Pertinent labs & imaging results that were  available during my care of the patient were reviewed by me and considered in my medical decision making (see chart for details).     Physical exam: Patient with inspiratory wheezes in left upper and lower lung fields.  Right lung fields clear to auscultation.  Cardiac exam within normal limits.  Patient's incision site shows mild erythema but is otherwise clean dry and intact.  No purulent drainage noted.  No significant swelling in the area.  Oropharynx is clear and moist.  There is no stridor on ascultation.   Initial conversation had with neurosurgery regarding the patient who state that they wish to give IV Decadron.  They also request a CTA of the neck.  Patient became intimately hypoxic to the upper 80s on room air.  CTA of the neck  reveals narrowing of the airway secondary to retropharyngeal swelling.  Airway at narrowest point was approximately 4 mm.  Patient to be admitted to neurosurgery for further observation given CT findings and hypoxia.   Final Clinical Impressions(s) / ED Diagnoses   Final diagnoses:  None    ED Discharge Orders    None       Chapman Moss, MD 09/24/17 1624    Duffy Bruce, MD 09/24/17 [id number]

## 2017-09-24 NOTE — ED Triage Notes (Signed)
Pt to ER for evaluation of sob, throat tightness, and neck pain after after cervical spine surgery Tuesday. Dr. Earnie Larsson performed the surgery. Pt site is reddened. Pt constantly clearing voice in triage. States "I feel like if I go to sleep I won't wake up." pt in NAD at this time. VSS.

## 2017-09-24 NOTE — Progress Notes (Signed)
2000 Pt arrived from ED via wheelchair. Pt A&Ox4, pt wearing soft collar to neck, assessed, see flow sheet. Pt oriented to room, reviewed POC with pt. Pt asking for dinner, called nutrition, closed for night, broth and jello provided. Wife at bedside and concerned about pain meds. Dr. Vertell Limber called for orders, awaiting call back. Pt independent, all questions answered, WCTM.   2105 Pt medicated per [facility] and for pain to anterior neck. No needs at this time. WCTM.

## 2017-09-25 DIAGNOSIS — M542 Cervicalgia: Secondary | ICD-10-CM | POA: Diagnosis not present

## 2017-09-25 MED ORDER — METHYLPREDNISOLONE 4 MG PO TBPK: ORAL_TABLET | ORAL | 0 refills | Status: DC

## 2017-09-25 NOTE — Progress Notes (Addendum)
0100 Pt sleeping, easy to arouse. Pt with frequent throat clearing while RN in room. Pt states, "not better, not worse". Medicated per MAR. Denies pain, SOB. No needs at this time. WCTM.   7672 Pt sleeping, easy to arouse, no needs at this time. Denies, pain, SOB, WCTM.   0615 Pt up and dressed, removed SCDs, walking in room. No complaints, denies pain, soft collar intact, WCTM.

## 2017-09-25 NOTE — Care Management Note (Signed)
Case Management Note  Patient Details  Name: Charles Oconnor MRN: [id number] Date of Birth: November 27, 1950  Subjective/Objective:     Pt admitted with respiratory distress following ACDF on 09/22/2017.              Action/Plan: Plan is for patient to return home when medically ready. CM following for d/c needs, physician orders.   Expected Discharge Date:                  Expected Discharge Plan:  Home/Self Care  In-House Referral:     Discharge planning Services     Post Acute Care Choice:    Choice offered to:     DME Arranged:    DME Agency:     HH Arranged:    HH Agency:     Status of Service:  In process, will continue to follow  If discussed at Long Length of Stay Meetings, dates discussed:    Additional Comments:  Pollie Friar, RN 09/25/2017, 1:51 PM

## 2017-09-25 NOTE — Discharge Summary (Signed)
Physician Discharge Summary  Patient ID: Charles Oconnor MRN: [id number] DOB/AGE: [birth year] 67 y.o.  Admit date: 09/24/2017 Discharge date: 09/25/2017  Admission Diagnoses:  Discharge Diagnoses:  Active Problems:   Respiratory distress following surgery   Discharged Condition: good  Hospital Course: Patient readmitted with symptoms of airway trouble.  Work-up in the emergency department demonstrate no evidence of postoperative hemorrhage.  Patient did have some mild narrowing of his upper airway.  The patient was admitted for observation.  He was kept on IV steroids.  The patient states that his breathing is easier.  He is swallowing well.  He desires discharge home.  He is having no neurologic symptoms.  Consults:   Significant Diagnostic Studies:   Treatments:   Discharge Exam: Blood pressure 132/62, pulse 64, temperature 98.4 F (36.9 C), temperature source Oral, resp. rate 16, height 6\' 1"  (1.854 m), weight 87.8 kg (193 lb 9 oz), SpO2 96 %. Awake and alert.  Oriented and appropriate.  Breathing easily.  Chest clear.  Neck soft.  Wound clean and dry.  Motor and sensory function intact.  Disposition: Discharge disposition: 01-Home or Self Care        Allergies as of 09/25/2017      Reactions   Zocor [simvastatin] Nausea Only, Other (See Comments)   Elevate liver enzymes      Medication List    TAKE these medications   amLODipine 10 MG tablet Commonly known as:  NORVASC Take 10 mg by mouth daily.   aspirin EC 81 MG tablet Take 81 mg by mouth daily.   cyclobenzaprine 10 MG tablet Commonly known as:  FLEXERIL Take 1 tablet (10 mg total) by mouth 3 (three) times daily as needed for muscle spasms.   HYDROcodone-acetaminophen 5-325 MG tablet Commonly known as:  NORCO/VICODIN Take 1 tablet by mouth every 4 (four) hours as needed for moderate pain ((score 4 to 6)).   lisinopril-hydrochlorothiazide 20-12.5 MG tablet Commonly known as:  PRINZIDE,ZESTORETIC Take 2  tablets by mouth daily.   methylPREDNISolone 4 MG Tbpk tablet Commonly known as:  MEDROL DOSEPAK follow package directions   multivitamin with minerals Tabs tablet Take 1 tablet by mouth daily.   naproxen sodium 220 MG tablet Commonly known as:  ALEVE Take 440 mg by mouth 3 (three) times daily as needed (for pain).   pantoprazole 40 MG tablet Commonly known as:  PROTONIX Take 40 mg by mouth every evening.   pravastatin 80 MG tablet Commonly known as:  PRAVACHOL Take 80 mg by mouth at bedtime.   triamcinolone cream 0.1 % Commonly known as:  KENALOG Apply 1 application topically 2 (two) times daily as needed (for eczema).        Signed: Cooper Render Deckard Stuber 09/25/2017, 4:06 PM

## 2017-09-25 NOTE — Care Management Note (Signed)
Case Management Note  Patient Details  Name: Charles Oconnor MRN: [id number] Date of Birth: 10/31/1950  Subjective/Objective:                    Action/Plan: Pt discharging home with self care. Pt has PCP, hospital f/u, insurance and transportation home.   Expected Discharge Date:  09/25/17               Expected Discharge Plan:  Home/Self Care  In-House Referral:     Discharge planning Services     Post Acute Care Choice:    Choice offered to:     DME Arranged:    DME Agency:     HH Arranged:    HH Agency:     Status of Service:  Completed, signed off  If discussed at [employer] of Stay Meetings, dates discussed:    Additional Comments:  Pollie Friar, RN 09/25/2017, 4:11 PM

## 2017-09-25 NOTE — Progress Notes (Signed)
Pt being discharged from hospital per orders from MD. Pt educated on discharge instructions. Pt verbalized understanding of instructions. All questions and concerns were addressed. Pt's IV was removed prior to discharge. Pt exited hospital via wheelchair accompanied by staff. 

## 2017-10-21 DIAGNOSIS — M5412 Radiculopathy, cervical region: Secondary | ICD-10-CM | POA: Diagnosis not present

## 2017-10-21 DIAGNOSIS — Z6825 Body mass index (BMI) 25.0-25.9, adult: Secondary | ICD-10-CM | POA: Diagnosis not present

## 2017-10-21 DIAGNOSIS — I10 Essential (primary) hypertension: Secondary | ICD-10-CM | POA: Diagnosis not present

## 2017-11-19 DIAGNOSIS — M5412 Radiculopathy, cervical region: Secondary | ICD-10-CM | POA: Diagnosis not present

## 2017-12-04 DIAGNOSIS — S80862A Insect bite (nonvenomous), left lower leg, initial encounter: Secondary | ICD-10-CM | POA: Diagnosis not present

## 2017-12-31 DIAGNOSIS — M5412 Radiculopathy, cervical region: Secondary | ICD-10-CM | POA: Diagnosis not present

## 2018-01-19 DIAGNOSIS — M25552 Pain in left hip: Secondary | ICD-10-CM | POA: Diagnosis not present

## 2018-01-19 DIAGNOSIS — M79604 Pain in right leg: Secondary | ICD-10-CM | POA: Diagnosis not present

## 2018-02-05 DIAGNOSIS — M48061 Spinal stenosis, lumbar region without neurogenic claudication: Secondary | ICD-10-CM | POA: Diagnosis not present

## 2018-02-05 DIAGNOSIS — M25562 Pain in left knee: Secondary | ICD-10-CM | POA: Diagnosis not present

## 2018-02-05 DIAGNOSIS — Z23 Encounter for immunization: Secondary | ICD-10-CM | POA: Diagnosis not present

## 2018-02-05 DIAGNOSIS — M25561 Pain in right knee: Secondary | ICD-10-CM | POA: Diagnosis not present

## 2018-03-19 ENCOUNTER — Encounter: Payer: Self-pay | Admitting: Internal Medicine

## 2018-03-19 ENCOUNTER — Ambulatory Visit (INDEPENDENT_AMBULATORY_CARE_PROVIDER_SITE_OTHER): Payer: Medicare Other | Admitting: Internal Medicine

## 2018-03-19 VITALS — BP 114/78 | HR 52 | Ht 73.0 in | Wt 193.8 lb

## 2018-03-19 DIAGNOSIS — I10 Essential (primary) hypertension: Secondary | ICD-10-CM

## 2018-03-19 DIAGNOSIS — I2510 Atherosclerotic heart disease of native coronary artery without angina pectoris: Secondary | ICD-10-CM | POA: Diagnosis not present

## 2018-03-19 DIAGNOSIS — E785 Hyperlipidemia, unspecified: Secondary | ICD-10-CM | POA: Diagnosis not present

## 2018-03-19 DIAGNOSIS — I2583 Coronary atherosclerosis due to lipid rich plaque: Secondary | ICD-10-CM | POA: Diagnosis not present

## 2018-03-19 NOTE — Progress Notes (Signed)
OFFICE NOTE  Chief Complaint:  No complaints  Primary Care Physician: Charles Neer, MD  HPI:  Charles Oconnor  is a 67 year old gentleman with history of hypertension, dyslipidemia and mild to moderate coronary disease. We have been following him for dyslipidemia and he has had liver enzyme abnormalities before on Zocor and was changed to Pravachol with pretty good control. Sometimes he gets chest discomfort; however, it is pretty short lived and possibly related to that. His EKG today shows a sinus bradycardia at 54 and I understand recently he was placed on a beta blocker which caused marked sinus bradycardia and that was promptly discontinued. At this point, although there is possibly an occasion for that given his history of coronary disease, his heart rate being low, he is unable to tolerate a beta blocker. He is on an ACE inhibitor which is pretty good at controlling his blood pressure as well as amlodipine.   [name] returns today for followup. He reports doing fairly well. He's recently started doing some walking and has actually lost about 10 pounds in the last 6 months. He says he does feel better and have more energy. A portion is developed some peripheral neuropathy. He's been started on Neurontin by a neurologist in [place]. He seems to be tolerating this medicine although has some occasional lightheadedness and dizziness. He is peripheral nerve pain is better however does have numbness in his feet. He occasionally gets some mild swelling. He also was told that he had high vitamin B6 levels and was told to stop taking his multivitamin.  I the pleasure see Mr. Charles Oconnor back in the office today. Overall he is doing fairly well except he is struggling with low back pain and some neuropathic pain in his legs. He's been getting back injections without much improvement. He's also been describing some occasional chest discomfort and progressive fatigue over the past several  months. He feels like he has no energy. There is also complaints of mid back pain between his shoulder blades which occurs on unusual occasions and not associated with his low back pain. Sometimes it's a little worse when exerting himself although he's not been as active due to his back problems. As a reminder he had heart catheterization in 2011 with Dr. Tina Griffiths, which demonstrated mild to moderate coronary artery disease.  He recently had lab work through his primary care provider which looks fairly normal except for an elevated cholesterol profile. His total cholesterol is 166, HL 43, triglycerides 112 and LDL 101. He's currently on pravastatin 80 mg daily and says that he has had problems with myalgias and liver function abnormalities on simvastatin in the past.  [name] returns today for follow-up. He recently underwent a stress test which showed the following:   The left ventricular ejection fraction is mildly decreased (45-54%).  Nuclear stress EF: 52%.  No T wave inversion was noted during stress.  There was no ST segment deviation noted during stress.  Defect 1: There is a medium defect of moderate severity.  There is a moderate-sized and intensity, partially reversible inferoseptal perfusion defect which could represent ischemia. LVEF 52% with basal inferoseptal dyskinesis. This is an intermediate risk study.   Based on these findings, I recommended either continued medical therapy or we could consider cardiac catheterization. Subsequently he felt like his chest pain was worsening and ultimately based on his prior catheterization results, we decided to proceed with cardiac catheterization. The results are as follows:   Prox RCA  lesion, 40% stenosed. The lesion was not previously treated.  Prox Cx to Dist Cx lesion, 20% stenosed. The lesion was not previously treated.  Ost 1st Diag to 1st Diag lesion, 40% stenosed. The lesion was not previously treated.  Dist LAD lesion, 20%  stenosed. The lesion was not previously treated.  The left ventricular systolic function is normal.  Mild, non-obstructive CAD - somewhat improved from prior cath in 2011. Normal LV function. False positive nuclear stress test. At this point, Mr. Charles Oconnor is doing much better and denies any chest pain or worsening shortness of breath. He gets a small amount of ankle edema which I attribute to amlodipine.  04/01/2016  Mr. Charles Oconnor returns today for follow-up. He denies any chest pain or worsening shortness of breath. He apparently had a sore throat yesterday and has some drainage today. He is interested in some over-the-counter medications for that. He's had no chest pain that sounds anginal. Blood pressure well controlled. Recent cholesterol check through his primary care provider shows good control with LDL less than 100.  03/13/2017  Mr. Charles Oconnor returns today for follow-up.  Overall he is doing exceedingly well.  Blood pressure is well-controlled today 126/70.  His body mass index is near normal.  Cholesterol is due to be assessed but has been well controlled.  EKG shows sinus bradycardia at 52 without ischemic changes.  He denies any chest pain or worsening shortness of breath.  03/19/2018  Mr. Charles Oconnor is seen today in follow-up.  He recently underwent cervical surgery for pain.  He had a fusion of the lower cervical vertebrae by Dr. Trenton Gammon and is doing much better.  He did have postoperative swelling and had steroids because of difficulty breathing.  Lipids a year ago were well controlled.  He has an upcoming appoint with Dr. Manuella Ghazi who will check his cholesterol in about 2 months.  I asked him to send [place] those records.  He denies any chest pain or shortness of breath.  Blood pressure is well controlled.  He did recently retire.  PMHx:  Past Medical History:  Diagnosis Date  . Cervical spinal stenosis   . Complication of anesthesia   . Coronary artery disease    mild to moderate  .  Dyslipidemia   . ED (erectile dysfunction)   . Hypertension   . PONV (postoperative nausea and vomiting)    Happened once 30 yrs ago. No problems since.  . Prostate cancer [facility])     Past Surgical History:  Procedure Laterality Date  . ANTERIOR CERVICAL DECOMPRESSION/DISCECTOMY FUSION 4 LEVELS N/A 09/22/2017   Procedure: ACDF - C3-C4 - C4-C5 - C5-C6 - C6-C7;  Surgeon: Earnie Larsson, MD;  Location: [place];  Service: Neurosurgery;  Laterality: N/A;  . APPENDECTOMY  1985  . CARDIAC CATHETERIZATION  2002, 2011   mild-mod CAD  . CARDIAC CATHETERIZATION N/A 03/12/2015   Procedure: Left Heart Cath and Coronary Angiography;  Surgeon: Pixie Casino, MD;  Location: [place] CV LAB;  Service: Cardiovascular;  Laterality: N/A;  . HAND SURGERY Left    had joints fused  . PROSTATECTOMY  03/2007  . TOTAL KNEE ARTHROPLASTY  04/2009    FAMHx:  Family History  Problem Relation Age of Onset  . Heart attack Father   . Diabetes Mother   . Heart failure Mother   . Coronary artery disease Mother   . Stroke Mother   . Prostate cancer Brother   . Heart attack Brother  also CVA  . Heart attack Brother        also CVA  . Cancer Brother     SOCHx:   reports that he has never smoked. He has never used smokeless tobacco. He reports that he does not drink alcohol or use drugs.  ALLERGIES:  Allergies  Allergen Reactions  . Zocor [Simvastatin] Nausea Only and Other (See Comments)    Elevate liver enzymes    ROS: Pertinent items noted in HPI and remainder of comprehensive ROS otherwise negative.  HOME MEDS: Current Outpatient Medications  Medication Sig Dispense Refill  . amLODipine (NORVASC) 10 MG tablet Take 10 mg by mouth daily.    Marland Kitchen aspirin EC 81 MG tablet Take 81 mg by mouth daily.    Marland Kitchen lisinopril-hydrochlorothiazide (PRINZIDE,ZESTORETIC) 20-12.5 MG per tablet Take 2 tablets by mouth daily.     . pantoprazole (PROTONIX) 40 MG tablet Take 40 mg by mouth every evening.     .  pravastatin (PRAVACHOL) 80 MG tablet Take 80 mg by mouth at bedtime.      No current facility-administered medications for this visit.     LABS/IMAGING: No results found for this or any previous visit (from the past 48 hour(s)). No results found.  VITALS: BP 114/78   Pulse (!) 52   Ht 6\' 1"  (1.854 m)   Wt 193 lb 12.8 oz (87.9 kg)   BMI 25.57 kg/m   EXAM: General appearance: alert, no distress and thin Neck: no carotid bruit, no JVD, thyroid not enlarged, symmetric, no tenderness/mass/nodules and Right anterior neck base scar Lungs: clear to auscultation bilaterally Heart: regular rate and rhythm, S1, S2 normal, no murmur, click, rub or gallop Abdomen: soft, non-tender; bowel sounds normal; no masses,  no organomegaly Extremities: extremities normal, atraumatic, no cyanosis or edema Pulses: 2+ and symmetric Skin: Skin color, texture, turgor normal. No rashes or lesions Neurologic: Grossly normal Psych: Pleasant  EKG: Sinus bradycardia 52-personally reviewed  ASSESSMENT: 1. Mild nonobstructive coronary disease by cath (2016), somewhat improved compared to his prior study in 2011 2. Hypertension-controlled 3. Dyslipidemia - controlled  PLAN: 1.   Mr. Deruiter continues to do well with well-controlled blood pressure.  He is due for repeat lipid profile which his PCP will be ordering in a couple months.  He did have some mild nonobstructive coronary disease.  His goal LDL is less than 70 and was 76 a year ago.  I asked him to have her send me the records.  Otherwise no changes to his medicines today.  Follow-up annually or sooner as necessary.  Pixie Casino, MD, [facility], [place] Director of the [facility] Lip[facility]  Card[facility] Diplomate of the American Board of Clinical Lipidology Attending Cardiologist  Direct Dial: [phone]  Fax: [phone]  Website:  [email or web address]  Gretchen Weinfeld 03/19/2018, 9:12 AM

## 2018-03-19 NOTE — Patient Instructions (Signed)
Medication Instructions:  Your physician recommends that you continue on your current medications as directed. Please refer to the Current Medication list given to you today.  If you need a refill on your cardiac medications before your next appointment, please call your pharmacy.   Lab work: None  If you have labs (blood work) drawn today and your tests are completely normal, you will receive your results only by: Marland Kitchen MyChart Message (if you have MyChart) OR . A paper copy in the mail If you have any lab test that is abnormal or we need to change your treatment, we will call you to review the results.  Testing/Procedures: None   Follow-Up: At [facility], you and your health needs are our priority.  As part of our continuing mission to provide you with exceptional heart care, we have created designated Provider Care Teams.  These Care Teams include your primary Cardiologist (physician) and Advanced Practice Providers (APPs -  Physician Assistants and Nurse Practitioners) who all work together to provide you with the care you need, when you need it. You will need a follow up appointment in 12 months.  Please call our office 2 months in advance to schedule this appointment.  You may see Dr Lyman Bishop or one of the following Advanced Practice Providers on your designated Care Team: [name], [place] . Fabian Sharp, PA-C  Any Other Special Instructions Will Be Listed Below (If Applicable).

## 2018-03-22 DIAGNOSIS — M48062 Spinal stenosis, lumbar region with neurogenic claudication: Secondary | ICD-10-CM | POA: Diagnosis not present

## 2018-03-22 DIAGNOSIS — M5136 Other intervertebral disc degeneration, lumbar region: Secondary | ICD-10-CM | POA: Diagnosis not present

## 2018-03-22 DIAGNOSIS — M4802 Spinal stenosis, cervical region: Secondary | ICD-10-CM | POA: Diagnosis not present

## 2018-03-22 DIAGNOSIS — M47816 Spondylosis without myelopathy or radiculopathy, lumbar region: Secondary | ICD-10-CM | POA: Diagnosis not present

## 2018-03-22 DIAGNOSIS — M47812 Spondylosis without myelopathy or radiculopathy, cervical region: Secondary | ICD-10-CM | POA: Diagnosis not present

## 2018-03-31 DIAGNOSIS — I10 Essential (primary) hypertension: Secondary | ICD-10-CM | POA: Diagnosis not present

## 2018-03-31 DIAGNOSIS — Z6825 Body mass index (BMI) 25.0-25.9, adult: Secondary | ICD-10-CM | POA: Diagnosis not present

## 2018-03-31 DIAGNOSIS — M47816 Spondylosis without myelopathy or radiculopathy, lumbar region: Secondary | ICD-10-CM | POA: Diagnosis not present

## 2018-04-23 DIAGNOSIS — F411 Generalized anxiety disorder: Secondary | ICD-10-CM | POA: Diagnosis not present

## 2018-04-23 DIAGNOSIS — I119 Hypertensive heart disease without heart failure: Secondary | ICD-10-CM | POA: Diagnosis not present

## 2018-04-23 DIAGNOSIS — I2510 Atherosclerotic heart disease of native coronary artery without angina pectoris: Secondary | ICD-10-CM | POA: Diagnosis not present

## 2018-04-23 DIAGNOSIS — Z8546 Personal history of malignant neoplasm of prostate: Secondary | ICD-10-CM | POA: Diagnosis not present

## 2018-04-23 DIAGNOSIS — N529 Male erectile dysfunction, unspecified: Secondary | ICD-10-CM | POA: Diagnosis not present

## 2018-04-23 DIAGNOSIS — Z0000 Encounter for general adult medical examination without abnormal findings: Secondary | ICD-10-CM | POA: Diagnosis not present

## 2018-04-23 DIAGNOSIS — G4700 Insomnia, unspecified: Secondary | ICD-10-CM | POA: Diagnosis not present

## 2018-04-23 DIAGNOSIS — M549 Dorsalgia, unspecified: Secondary | ICD-10-CM | POA: Diagnosis not present

## 2018-04-23 DIAGNOSIS — E782 Mixed hyperlipidemia: Secondary | ICD-10-CM | POA: Diagnosis not present

## 2018-04-29 DIAGNOSIS — H00021 Hordeolum internum right upper eyelid: Secondary | ICD-10-CM | POA: Diagnosis not present

## 2018-04-29 DIAGNOSIS — Z888 Allergy status to other drugs, medicaments and biological substances status: Secondary | ICD-10-CM | POA: Diagnosis not present

## 2018-04-29 DIAGNOSIS — H11823 Conjunctivochalasis, bilateral: Secondary | ICD-10-CM | POA: Diagnosis not present

## 2018-04-29 DIAGNOSIS — H02883 Meibomian gland dysfunction of right eye, unspecified eyelid: Secondary | ICD-10-CM | POA: Diagnosis not present

## 2018-04-29 DIAGNOSIS — H43813 Vitreous degeneration, bilateral: Secondary | ICD-10-CM | POA: Diagnosis not present

## 2018-04-29 DIAGNOSIS — H0100A Unspecified blepharitis right eye, upper and lower eyelids: Secondary | ICD-10-CM | POA: Diagnosis not present

## 2018-04-29 DIAGNOSIS — H25813 Combined forms of age-related cataract, bilateral: Secondary | ICD-10-CM | POA: Diagnosis not present

## 2018-04-29 DIAGNOSIS — H02831 Dermatochalasis of right upper eyelid: Secondary | ICD-10-CM | POA: Diagnosis not present

## 2018-04-29 DIAGNOSIS — H0289 Other specified disorders of eyelid: Secondary | ICD-10-CM | POA: Diagnosis not present

## 2018-04-29 DIAGNOSIS — H02886 Meibomian gland dysfunction of left eye, unspecified eyelid: Secondary | ICD-10-CM | POA: Diagnosis not present

## 2018-04-29 DIAGNOSIS — H0100B Unspecified blepharitis left eye, upper and lower eyelids: Secondary | ICD-10-CM | POA: Diagnosis not present

## 2018-04-29 DIAGNOSIS — H02834 Dermatochalasis of left upper eyelid: Secondary | ICD-10-CM | POA: Diagnosis not present

## 2018-06-04 DIAGNOSIS — C61 Malignant neoplasm of prostate: Secondary | ICD-10-CM | POA: Diagnosis not present

## 2018-06-08 DIAGNOSIS — Z1211 Encounter for screening for malignant neoplasm of colon: Secondary | ICD-10-CM | POA: Diagnosis not present

## 2018-06-08 DIAGNOSIS — K5730 Diverticulosis of large intestine without perforation or abscess without bleeding: Secondary | ICD-10-CM | POA: Diagnosis not present

## 2018-06-08 DIAGNOSIS — K219 Gastro-esophageal reflux disease without esophagitis: Secondary | ICD-10-CM | POA: Diagnosis not present

## 2018-06-08 DIAGNOSIS — Z800 Family history of malignant neoplasm of digestive organs: Secondary | ICD-10-CM | POA: Diagnosis not present

## 2018-06-11 DIAGNOSIS — R3915 Urgency of urination: Secondary | ICD-10-CM | POA: Diagnosis not present

## 2018-06-11 DIAGNOSIS — N5231 Erectile dysfunction following radical prostatectomy: Secondary | ICD-10-CM | POA: Diagnosis not present

## 2018-06-11 DIAGNOSIS — N393 Stress incontinence (female) (male): Secondary | ICD-10-CM | POA: Diagnosis not present

## 2018-06-11 DIAGNOSIS — C61 Malignant neoplasm of prostate: Secondary | ICD-10-CM | POA: Diagnosis not present

## 2018-07-09 DIAGNOSIS — M25562 Pain in left knee: Secondary | ICD-10-CM | POA: Diagnosis not present

## 2018-07-09 DIAGNOSIS — M25561 Pain in right knee: Secondary | ICD-10-CM | POA: Diagnosis not present

## 2018-07-17 DIAGNOSIS — M25562 Pain in left knee: Secondary | ICD-10-CM | POA: Diagnosis not present

## 2018-07-22 DIAGNOSIS — M25562 Pain in left knee: Secondary | ICD-10-CM | POA: Diagnosis not present

## 2018-07-23 DIAGNOSIS — Z1211 Encounter for screening for malignant neoplasm of colon: Secondary | ICD-10-CM | POA: Diagnosis not present

## 2018-07-23 DIAGNOSIS — K5730 Diverticulosis of large intestine without perforation or abscess without bleeding: Secondary | ICD-10-CM | POA: Diagnosis not present

## 2018-07-23 DIAGNOSIS — Z800 Family history of malignant neoplasm of digestive organs: Secondary | ICD-10-CM | POA: Diagnosis not present

## 2018-07-30 DIAGNOSIS — N5231 Erectile dysfunction following radical prostatectomy: Secondary | ICD-10-CM | POA: Diagnosis not present

## 2018-07-30 DIAGNOSIS — C61 Malignant neoplasm of prostate: Secondary | ICD-10-CM | POA: Diagnosis not present

## 2018-07-30 DIAGNOSIS — N393 Stress incontinence (female) (male): Secondary | ICD-10-CM | POA: Diagnosis not present

## 2018-08-03 DIAGNOSIS — H2513 Age-related nuclear cataract, bilateral: Secondary | ICD-10-CM | POA: Diagnosis not present

## 2018-08-03 DIAGNOSIS — H00024 Hordeolum internum left upper eyelid: Secondary | ICD-10-CM | POA: Diagnosis not present

## 2018-09-02 DIAGNOSIS — D2371 Other benign neoplasm of skin of right lower limb, including hip: Secondary | ICD-10-CM | POA: Diagnosis not present

## 2018-09-02 DIAGNOSIS — L821 Other seborrheic keratosis: Secondary | ICD-10-CM | POA: Diagnosis not present

## 2018-09-02 DIAGNOSIS — D2372 Other benign neoplasm of skin of left lower limb, including hip: Secondary | ICD-10-CM | POA: Diagnosis not present

## 2018-09-02 DIAGNOSIS — Z85828 Personal history of other malignant neoplasm of skin: Secondary | ICD-10-CM | POA: Diagnosis not present

## 2018-09-02 DIAGNOSIS — D2272 Melanocytic nevi of left lower limb, including hip: Secondary | ICD-10-CM | POA: Diagnosis not present

## 2018-09-02 DIAGNOSIS — D225 Melanocytic nevi of trunk: Secondary | ICD-10-CM | POA: Diagnosis not present

## 2018-09-02 DIAGNOSIS — D1801 Hemangioma of skin and subcutaneous tissue: Secondary | ICD-10-CM | POA: Diagnosis not present

## 2018-09-02 DIAGNOSIS — L814 Other melanin hyperpigmentation: Secondary | ICD-10-CM | POA: Diagnosis not present

## 2018-09-02 DIAGNOSIS — L570 Actinic keratosis: Secondary | ICD-10-CM | POA: Diagnosis not present

## 2018-10-14 DIAGNOSIS — E782 Mixed hyperlipidemia: Secondary | ICD-10-CM | POA: Diagnosis not present

## 2018-10-19 DIAGNOSIS — M23322 Other meniscus derangements, posterior horn of medial meniscus, left knee: Secondary | ICD-10-CM | POA: Diagnosis not present

## 2018-10-19 DIAGNOSIS — M2242 Chondromalacia patellae, left knee: Secondary | ICD-10-CM | POA: Diagnosis not present

## 2018-11-15 DIAGNOSIS — H02833 Dermatochalasis of right eye, unspecified eyelid: Secondary | ICD-10-CM | POA: Diagnosis not present

## 2018-11-15 DIAGNOSIS — H43393 Other vitreous opacities, bilateral: Secondary | ICD-10-CM | POA: Diagnosis not present

## 2018-11-15 DIAGNOSIS — H43812 Vitreous degeneration, left eye: Secondary | ICD-10-CM | POA: Diagnosis not present

## 2018-11-15 DIAGNOSIS — H02836 Dermatochalasis of left eye, unspecified eyelid: Secondary | ICD-10-CM | POA: Diagnosis not present

## 2018-11-15 DIAGNOSIS — H25813 Combined forms of age-related cataract, bilateral: Secondary | ICD-10-CM | POA: Diagnosis not present

## 2018-11-16 DIAGNOSIS — M25562 Pain in left knee: Secondary | ICD-10-CM | POA: Diagnosis not present

## 2018-11-16 DIAGNOSIS — M1712 Unilateral primary osteoarthritis, left knee: Secondary | ICD-10-CM | POA: Diagnosis not present

## 2018-12-27 DIAGNOSIS — H43393 Other vitreous opacities, bilateral: Secondary | ICD-10-CM | POA: Diagnosis not present

## 2018-12-27 DIAGNOSIS — H43812 Vitreous degeneration, left eye: Secondary | ICD-10-CM | POA: Diagnosis not present

## 2019-01-12 DIAGNOSIS — Z23 Encounter for immunization: Secondary | ICD-10-CM | POA: Diagnosis not present

## 2019-01-20 DIAGNOSIS — M25562 Pain in left knee: Secondary | ICD-10-CM | POA: Diagnosis not present

## 2019-01-20 DIAGNOSIS — M1712 Unilateral primary osteoarthritis, left knee: Secondary | ICD-10-CM | POA: Diagnosis not present

## 2019-01-27 DIAGNOSIS — M1712 Unilateral primary osteoarthritis, left knee: Secondary | ICD-10-CM | POA: Diagnosis not present

## 2019-01-27 DIAGNOSIS — M25562 Pain in left knee: Secondary | ICD-10-CM | POA: Diagnosis not present

## 2019-02-03 DIAGNOSIS — M1712 Unilateral primary osteoarthritis, left knee: Secondary | ICD-10-CM | POA: Diagnosis not present

## 2019-02-22 ENCOUNTER — Emergency Department (HOSPITAL_COMMUNITY): Payer: Medicare Other | Admitting: Anesthesiology

## 2019-02-22 ENCOUNTER — Encounter (HOSPITAL_COMMUNITY): Admission: EM | Disposition: A | Discharge status: Home / Self Care | Payer: Self-pay | Source: Home / Self Care

## 2019-02-22 ENCOUNTER — Other Ambulatory Visit: Payer: Self-pay

## 2019-02-22 ENCOUNTER — Encounter (HOSPITAL_COMMUNITY): Payer: Self-pay

## 2019-02-22 ENCOUNTER — Emergency Department (HOSPITAL_COMMUNITY): Payer: Medicare Other

## 2019-02-22 ENCOUNTER — Inpatient Hospital Stay (HOSPITAL_COMMUNITY)
Admission: EM | Admit: 2019-02-22 | Discharge: 2019-02-25 | DRG: 337 | Disposition: A | Discharge status: Home / Self Care | Payer: Medicare Other | Attending: General Surgery | Admitting: General Surgery

## 2019-02-22 DIAGNOSIS — Z20828 Contact with and (suspected) exposure to other viral communicable diseases: Secondary | ICD-10-CM | POA: Diagnosis not present

## 2019-02-22 DIAGNOSIS — Z981 Arthrodesis status: Secondary | ICD-10-CM | POA: Diagnosis not present

## 2019-02-22 DIAGNOSIS — Z833 Family history of diabetes mellitus: Secondary | ICD-10-CM | POA: Diagnosis not present

## 2019-02-22 DIAGNOSIS — K565 Intestinal adhesions [bands], unspecified as to partial versus complete obstruction: Principal | ICD-10-CM | POA: Diagnosis present

## 2019-02-22 DIAGNOSIS — K5651 Intestinal adhesions [bands], with partial obstruction: Secondary | ICD-10-CM | POA: Diagnosis not present

## 2019-02-22 DIAGNOSIS — K219 Gastro-esophageal reflux disease without esophagitis: Secondary | ICD-10-CM | POA: Diagnosis present

## 2019-02-22 DIAGNOSIS — Z8546 Personal history of malignant neoplasm of prostate: Secondary | ICD-10-CM | POA: Diagnosis not present

## 2019-02-22 DIAGNOSIS — I2510 Atherosclerotic heart disease of native coronary artery without angina pectoris: Secondary | ICD-10-CM | POA: Diagnosis present

## 2019-02-22 DIAGNOSIS — K56699 Other intestinal obstruction unspecified as to partial versus complete obstruction: Secondary | ICD-10-CM | POA: Diagnosis not present

## 2019-02-22 DIAGNOSIS — I10 Essential (primary) hypertension: Secondary | ICD-10-CM | POA: Diagnosis present

## 2019-02-22 DIAGNOSIS — Z8042 Family history of malignant neoplasm of prostate: Secondary | ICD-10-CM | POA: Diagnosis not present

## 2019-02-22 DIAGNOSIS — Q430 Meckel's diverticulum (displaced) (hypertrophic): Secondary | ICD-10-CM | POA: Diagnosis not present

## 2019-02-22 DIAGNOSIS — Z823 Family history of stroke: Secondary | ICD-10-CM | POA: Diagnosis not present

## 2019-02-22 DIAGNOSIS — Z888 Allergy status to other drugs, medicaments and biological substances status: Secondary | ICD-10-CM

## 2019-02-22 DIAGNOSIS — Z9049 Acquired absence of other specified parts of digestive tract: Secondary | ICD-10-CM

## 2019-02-22 DIAGNOSIS — Z7982 Long term (current) use of aspirin: Secondary | ICD-10-CM

## 2019-02-22 DIAGNOSIS — K469 Unspecified abdominal hernia without obstruction or gangrene: Secondary | ICD-10-CM | POA: Diagnosis present

## 2019-02-22 DIAGNOSIS — Z9079 Acquired absence of other genital organ(s): Secondary | ICD-10-CM | POA: Diagnosis not present

## 2019-02-22 DIAGNOSIS — K56609 Unspecified intestinal obstruction, unspecified as to partial versus complete obstruction: Secondary | ICD-10-CM | POA: Diagnosis not present

## 2019-02-22 DIAGNOSIS — Z8249 Family history of ischemic heart disease and other diseases of the circulatory system: Secondary | ICD-10-CM

## 2019-02-22 DIAGNOSIS — R1084 Generalized abdominal pain: Secondary | ICD-10-CM | POA: Diagnosis not present

## 2019-02-22 DIAGNOSIS — E785 Hyperlipidemia, unspecified: Secondary | ICD-10-CM | POA: Diagnosis not present

## 2019-02-22 DIAGNOSIS — Z96659 Presence of unspecified artificial knee joint: Secondary | ICD-10-CM | POA: Diagnosis not present

## 2019-02-22 DIAGNOSIS — R079 Chest pain, unspecified: Secondary | ICD-10-CM | POA: Diagnosis not present

## 2019-02-22 HISTORY — PX: COLON RESECTION: SHX5231

## 2019-02-22 HISTORY — PX: LAPAROSCOPIC LYSIS OF ADHESIONS: SHX5905

## 2019-02-22 LAB — COMPREHENSIVE METABOLIC PANEL
ALT: 28 U/L (ref 0–44)
AST: 26 U/L (ref 15–41)
Albumin: 4.7 g/dL (ref 3.5–5.0)
Alkaline Phosphatase: 68 U/L (ref 38–126)
Anion gap: 13 (ref 5–15)
BUN: 20 mg/dL (ref 8–23)
CO2: 23 mmol/L (ref 22–32)
Calcium: 10.2 mg/dL (ref 8.9–10.3)
Chloride: 103 mmol/L (ref 98–111)
Creatinine, Ser: 1 mg/dL (ref 0.61–1.24)
GFR calc Af Amer: >60 mL/min (ref >60)
GFR calc non Af Amer: >60 mL/min (ref >60)
Glucose, Bld: 156 mg/dL — ABNORMAL HIGH (ref 70–99)
Potassium: 3.5 mmol/L (ref 3.5–5.1)
Sodium: 139 mmol/L (ref 135–145)
Total Bilirubin: 1.4 mg/dL — ABNORMAL HIGH (ref 0.3–1.2)
Total Protein: 7.9 g/dL (ref 6.5–8.1)

## 2019-02-22 LAB — URINALYSIS, ROUTINE W REFLEX MICROSCOPIC
Bilirubin Urine: NEGATIVE
Glucose, UA: NEGATIVE mg/dL
Hgb urine dipstick: NEGATIVE
Ketones, ur: 20 mg/dL — AB
Leukocytes,Ua: NEGATIVE
Nitrite: NEGATIVE
Protein, ur: 30 mg/dL — AB
Specific Gravity, Urine: 1.036 — ABNORMAL HIGH (ref 1.005–1.030)
pH: 5 (ref 5.0–8.0)

## 2019-02-22 LAB — CBC
HCT: 46.6 % (ref 39.0–52.0)
Hemoglobin: 15.7 g/dL (ref 13.0–17.0)
MCH: 30.1 pg (ref 26.0–34.0)
MCHC: 33.7 g/dL (ref 30.0–36.0)
MCV: 89.3 fL (ref 80.0–100.0)
Platelets: 269 10*3/uL (ref 150–400)
RBC: 5.22 MIL/uL (ref 4.22–5.81)
RDW: 13 % (ref 11.5–15.5)
WBC: 16.8 10*3/uL — ABNORMAL HIGH (ref 4.0–10.5)
nRBC: 0 % (ref 0.0–0.2)

## 2019-02-22 LAB — SARS CORONAVIRUS 2 BY RT PCR (HOSPITAL ORDER, PERFORMED IN ~~LOC~~ HOSPITAL LAB): SARS Coronavirus 2: NEGATIVE

## 2019-02-22 LAB — LIPASE, BLOOD: Lipase: 30 U/L (ref 11–51)

## 2019-02-22 LAB — TROPONIN I (HIGH SENSITIVITY)
Troponin I (High Sensitivity): 3 ng/L (ref <18)
Troponin I (High Sensitivity): 3 ng/L (ref <18)

## 2019-02-22 SURGERY — COLON RESECTION LAPAROSCOPIC
Anesthesia: General | Site: Abdomen

## 2019-02-22 MED ORDER — FENTANYL CITRATE (PF) 100 MCG/2ML IJ SOLN
INTRAMUSCULAR | Status: DC | PRN
  Administered 2019-02-22: 50 ug via INTRAVENOUS
  Administered 2019-02-22: 100 ug via INTRAVENOUS
  Administered 2019-02-23: 50 ug via INTRAVENOUS

## 2019-02-22 MED ORDER — PROPOFOL 10 MG/ML IV BOLUS
INTRAVENOUS | Status: DC | PRN
  Administered 2019-02-22: 110 mg via INTRAVENOUS

## 2019-02-22 MED ORDER — ONDANSETRON HCL 4 MG/2ML IJ SOLN
4.0000 mg | Freq: Once | INTRAMUSCULAR | Status: AC
  Administered 2019-02-22: 4 mg via INTRAVENOUS
  Filled 2019-02-22: qty 2

## 2019-02-22 MED ORDER — SODIUM CHLORIDE (PF) 0.9 % IJ SOLN
INTRAMUSCULAR | Status: AC
  Filled 2019-02-22: qty 50

## 2019-02-22 MED ORDER — LIDOCAINE HCL URETHRAL/MUCOSAL 2 % EX GEL
CUTANEOUS | Status: AC
  Filled 2019-02-22: qty 11

## 2019-02-22 MED ORDER — PROPOFOL 10 MG/ML IV BOLUS
INTRAVENOUS | Status: AC
  Filled 2019-02-22: qty 20

## 2019-02-22 MED ORDER — 0.9 % SODIUM CHLORIDE (POUR BTL) OPTIME
TOPICAL | Status: DC | PRN
  Administered 2019-02-22: 1000 mL

## 2019-02-22 MED ORDER — SODIUM CHLORIDE 0.9% FLUSH
3.0000 mL | Freq: Once | INTRAVENOUS | Status: AC
  Administered 2019-02-22: 3 mL via INTRAVENOUS

## 2019-02-22 MED ORDER — BUPIVACAINE HCL (PF) 0.25 % IJ SOLN
INTRAMUSCULAR | Status: AC
  Filled 2019-02-22: qty 30

## 2019-02-22 MED ORDER — SODIUM CHLORIDE 0.9 % IV SOLN
1000.0000 mL | INTRAVENOUS | Status: DC
  Administered 2019-02-22 – 2019-02-23 (×3): 1000 mL via INTRAVENOUS

## 2019-02-22 MED ORDER — SODIUM CHLORIDE 0.9 % IV BOLUS (SEPSIS)
1000.0000 mL | Freq: Once | INTRAVENOUS | Status: AC
  Administered 2019-02-22: 1000 mL via INTRAVENOUS

## 2019-02-22 MED ORDER — LACTATED RINGERS IV SOLN
INTRAVENOUS | Status: DC | PRN
  Administered 2019-02-22: via INTRAVENOUS

## 2019-02-22 MED ORDER — IOHEXOL 300 MG/ML  SOLN
100.0000 mL | Freq: Once | INTRAMUSCULAR | Status: AC | PRN
  Administered 2019-02-22: 100 mL via INTRAVENOUS

## 2019-02-22 MED ORDER — LACTATED RINGERS IV SOLN
INTRAVENOUS | Status: DC | PRN
  Administered 2019-02-22: 23:00:00 via INTRAVENOUS

## 2019-02-22 MED ORDER — DEXAMETHASONE SODIUM PHOSPHATE 10 MG/ML IJ SOLN
INTRAMUSCULAR | Status: DC | PRN
  Administered 2019-02-22: 8 mg via INTRAVENOUS

## 2019-02-22 MED ORDER — MORPHINE SULFATE (PF) 4 MG/ML IV SOLN
4.0000 mg | Freq: Once | INTRAVENOUS | Status: AC
  Administered 2019-02-22: 4 mg via INTRAVENOUS
  Filled 2019-02-22: qty 1

## 2019-02-22 MED ORDER — SODIUM CHLORIDE 0.9 % IR SOLN
Status: DC | PRN
  Administered 2019-02-22: 3000 mL

## 2019-02-22 MED ORDER — ROCURONIUM BROMIDE 10 MG/ML (PF) SYRINGE
PREFILLED_SYRINGE | INTRAVENOUS | Status: DC | PRN
  Administered 2019-02-22: 50 mg via INTRAVENOUS

## 2019-02-22 MED ORDER — FENTANYL CITRATE (PF) 250 MCG/5ML IJ SOLN
INTRAMUSCULAR | Status: AC
  Filled 2019-02-22: qty 5

## 2019-02-22 MED ORDER — SUCCINYLCHOLINE CHLORIDE 200 MG/10ML IV SOSY
PREFILLED_SYRINGE | INTRAVENOUS | Status: DC | PRN
  Administered 2019-02-22: 100 mg via INTRAVENOUS

## 2019-02-22 MED ORDER — PIPERACILLIN-TAZOBACTAM 3.375 G IVPB
3.3750 g | Freq: Three times a day (TID) | INTRAVENOUS | Status: DC
  Administered 2019-02-22 – 2019-02-25 (×8): 3.375 g via INTRAVENOUS
  Filled 2019-02-22 (×9): qty 50

## 2019-02-22 MED ORDER — LIDOCAINE 2% (20 MG/ML) 5 ML SYRINGE
INTRAMUSCULAR | Status: DC | PRN
  Administered 2019-02-22: 60 mg via INTRAVENOUS

## 2019-02-22 SURGICAL SUPPLY — 84 items
APPLIER CLIP 5 13 M/L LIGAMAX5 (MISCELLANEOUS)
APPLIER CLIP ROT 10 11.4 M/L (STAPLE)
BENZOIN TINCTURE PRP APPL 2/3 (GAUZE/BANDAGES/DRESSINGS) IMPLANT
BLADE EXTENDED COATED 6.5IN (ELECTRODE) IMPLANT
BNDG ADH 1X3 SHEER STRL LF (GAUZE/BANDAGES/DRESSINGS) IMPLANT
CABLE HIGH FREQUENCY MONO STRZ (ELECTRODE) ×3 IMPLANT
CELLS DAT CNTRL 66122 CELL SVR (MISCELLANEOUS) IMPLANT
CHLORAPREP W/TINT 26 (MISCELLANEOUS) ×3 IMPLANT
CLIP APPLIE 5 13 M/L LIGAMAX5 (MISCELLANEOUS) IMPLANT
CLIP APPLIE ROT 10 11.4 M/L (STAPLE) IMPLANT
CLOSURE WOUND 1/2 X4 (GAUZE/BANDAGES/DRESSINGS)
COUNTER NEEDLE 20 DBL MAG RED (NEEDLE) ×3 IMPLANT
COVER MAYO STAND STRL (DRAPES) ×9 IMPLANT
COVER SURGICAL LIGHT HANDLE (MISCELLANEOUS) ×3 IMPLANT
COVER WAND RF STERILE (DRAPES) IMPLANT
DECANTER SPIKE VIAL GLASS SM (MISCELLANEOUS) ×3 IMPLANT
DERMABOND ADVANCED (GAUZE/BANDAGES/DRESSINGS) ×2
DERMABOND ADVANCED .7 DNX12 (GAUZE/BANDAGES/DRESSINGS) ×1 IMPLANT
DRAIN CHANNEL 19F RND (DRAIN) IMPLANT
DRAPE LAPAROSCOPIC ABDOMINAL (DRAPES) ×3 IMPLANT
DRAPE UTILITY XL STRL (DRAPES) ×3 IMPLANT
DRSG OPSITE POSTOP 4X10 (GAUZE/BANDAGES/DRESSINGS) IMPLANT
DRSG OPSITE POSTOP 4X6 (GAUZE/BANDAGES/DRESSINGS) IMPLANT
DRSG OPSITE POSTOP 4X8 (GAUZE/BANDAGES/DRESSINGS) IMPLANT
ELECT PENCIL ROCKER SW 15FT (MISCELLANEOUS) ×9 IMPLANT
ELECT REM PT RETURN 15FT ADLT (MISCELLANEOUS) ×3 IMPLANT
ENDOLOOP SUT PDS II  0 18 (SUTURE)
ENDOLOOP SUT PDS II 0 18 (SUTURE) IMPLANT
GAUZE SPONGE 4X4 12PLY STRL (GAUZE/BANDAGES/DRESSINGS) IMPLANT
GLOVE BIO SURGEON STRL SZ 6.5 (GLOVE) ×2 IMPLANT
GLOVE BIO SURGEONS STRL SZ 6.5 (GLOVE) ×1
GLOVE BIOGEL PI IND STRL 6.5 (GLOVE) ×1 IMPLANT
GLOVE BIOGEL PI IND STRL 7.0 (GLOVE) ×3 IMPLANT
GLOVE BIOGEL PI IND STRL 7.5 (GLOVE) ×2 IMPLANT
GLOVE BIOGEL PI IND STRL 8 (GLOVE) ×1 IMPLANT
GLOVE BIOGEL PI INDICATOR 6.5 (GLOVE) ×2
GLOVE BIOGEL PI INDICATOR 7.0 (GLOVE) ×6
GLOVE BIOGEL PI INDICATOR 7.5 (GLOVE) ×4
GLOVE BIOGEL PI INDICATOR 8 (GLOVE) ×2
GLOVE ECLIPSE 8.0 STRL XLNG CF (GLOVE) ×3 IMPLANT
GLOVE SURG SS PI 7.0 STRL IVOR (GLOVE) ×9 IMPLANT
GOWN STRL REUS W/TWL LRG LVL3 (GOWN DISPOSABLE) ×3 IMPLANT
GOWN STRL REUS W/TWL XL LVL3 (GOWN DISPOSABLE) ×6 IMPLANT
HANDLE SUCTION POOLE (INSTRUMENTS) IMPLANT
IRRIG SUCT STRYKERFLOW 2 WTIP (MISCELLANEOUS) ×3
IRRIGATION SUCT STRKRFLW 2 WTP (MISCELLANEOUS) ×1 IMPLANT
KIT TURNOVER KIT A (KITS) ×3 IMPLANT
LEGGING LITHOTOMY PAIR STRL (DRAPES) ×3 IMPLANT
PACK COLON (CUSTOM PROCEDURE TRAY) ×3 IMPLANT
PAD POSITIONING PINK XL (MISCELLANEOUS) ×3 IMPLANT
PORT LAP GEL ALEXIS MED 5-9CM (MISCELLANEOUS) ×3 IMPLANT
PROTECTOR NERVE ULNAR (MISCELLANEOUS) ×3 IMPLANT
RELOAD STAPLER WHITE 60MM (STAPLE) IMPLANT
RTRCTR WOUND ALEXIS 18CM MED (MISCELLANEOUS)
SCISSORS LAP 5X35 DISP (ENDOMECHANICALS) ×6 IMPLANT
SEALER TISSUE G2 STRG ARTC 35C (ENDOMECHANICALS) ×3 IMPLANT
SET TUBE SMOKE EVAC HIGH FLOW (TUBING) ×3 IMPLANT
SLEEVE XCEL OPT CAN 5 100 (ENDOMECHANICALS) ×6 IMPLANT
STAPLER ECHELON LONG 60 440 (INSTRUMENTS) IMPLANT
STAPLER RELOAD WHITE 60MM (STAPLE)
STAPLER VISISTAT 35W (STAPLE) ×3 IMPLANT
STRIP CLOSURE SKIN 1/2X4 (GAUZE/BANDAGES/DRESSINGS) IMPLANT
SUCTION POOLE HANDLE (INSTRUMENTS)
SUT MNCRL AB 4-0 PS2 18 (SUTURE) ×3 IMPLANT
SUT PDS AB 0 CT1 36 (SUTURE) ×6 IMPLANT
SUT PDS AB 3-0 SH 27 (SUTURE) ×3 IMPLANT
SUT PROLENE 2 0 KS (SUTURE) IMPLANT
SUT SILK 2 0 (SUTURE) ×2
SUT SILK 2 0 SH CR/8 (SUTURE) ×3 IMPLANT
SUT SILK 2-0 18XBRD TIE 12 (SUTURE) ×1 IMPLANT
SUT SILK 3 0 (SUTURE) ×2
SUT SILK 3 0 SH CR/8 (SUTURE) ×6 IMPLANT
SUT SILK 3-0 18XBRD TIE 12 (SUTURE) ×1 IMPLANT
SUT VIC AB 2-0 SH 27 (SUTURE) ×2
SUT VIC AB 2-0 SH 27X BRD (SUTURE) ×1 IMPLANT
SUT VICRYL 0 ENDOLOOP (SUTURE) IMPLANT
SYS LAPSCP GELPORT 120MM (MISCELLANEOUS)
SYSTEM LAPSCP GELPORT 120MM (MISCELLANEOUS) IMPLANT
TAPE CLOTH 4X10 WHT NS (GAUZE/BANDAGES/DRESSINGS) ×3 IMPLANT
TOWEL OR 17X26 10 PK STRL BLUE (TOWEL DISPOSABLE) ×3 IMPLANT
TOWEL OR NON WOVEN STRL DISP B (DISPOSABLE) ×3 IMPLANT
TRAY FOLEY MTR SLVR 16FR STAT (SET/KITS/TRAYS/PACK) ×3 IMPLANT
TROCAR BLADELESS OPT 5 100 (ENDOMECHANICALS) ×3 IMPLANT
TROCAR XCEL 12X100 BLDLESS (ENDOMECHANICALS) ×3 IMPLANT

## 2019-02-22 NOTE — ED Notes (Signed)
Pt requested more nausea medication. MD made aware.

## 2019-02-22 NOTE — ED Provider Notes (Signed)
[place] DEPT Provider Note   CSN: [id number] Arrival date & time: 02/22/19  1537     History   Chief Complaint Chief Complaint  Patient presents with   Chest Pain   Abdominal Pain    HPI Charles Oconnor is a 68 y.o. male.     HPI Patient presents emergency room for evaluation of vomiting diarrhea upper abdominal and lower chest pain.  Patient states he started having symptoms last night.  He initially started with nausea but then began having few episodes of vomiting.  Patient also started having issues with diarrhea.  He has had too numerous to count episodes.  He has pain in his upper abdomen and lower chest.  He is not able to eat or drink anything.  He denies any trouble with any shortness of breath.  Nothing in particular makes symptoms worse.  He denies any history of pancreatic or liver disease.  He had a prior appendectomy.  Patient denies any smoking or alcohol use.  Past Medical History:  Diagnosis Date   Cervical spinal stenosis    Complication of anesthesia    Coronary artery disease    mild to moderate   Dyslipidemia    ED (erectile dysfunction)    Hypertension    PONV (postoperative nausea and vomiting)    Happened once 30 yrs ago. No problems since.   Prostate cancer [facility])     Patient Active Problem List   Diagnosis Date Noted   Respiratory distress following surgery 09/24/2017   Cervical spondylosis with radiculopathy 09/22/2017   Viral URI with cough 04/01/2016   Chest pain, unspecified    HTN (hypertension) 01/13/2013   Dyslipidemia 01/13/2013   CAD (coronary artery disease) 01/13/2013    Past Surgical History:  Procedure Laterality Date   ANTERIOR CERVICAL DECOMPRESSION/DISCECTOMY FUSION 4 LEVELS N/A 09/22/2017   Procedure: ACDF - C3-C4 - C4-C5 - C5-C6 - C6-C7;  Surgeon: Earnie Larsson, MD;  Location: [place];  Service: Neurosurgery;  Laterality: N/A;   APPENDECTOMY  1985   CARDIAC  CATHETERIZATION  2002, 2011   mild-mod CAD   CARDIAC CATHETERIZATION N/A 03/12/2015   Procedure: Left Heart Cath and Coronary Angiography;  Surgeon: Pixie Casino, MD;  Location: [place] [facility];  Service: Cardiovascular;  Laterality: N/A;   HAND SURGERY Left    had joints fused   PROSTATECTOMY  03/2007   TOTAL KNEE ARTHROPLASTY  04/2009        Home Medications    Prior to Admission medications   Medication Sig Start Date End Date Taking? Authorizing Provider  amLODipine (NORVASC) 10 MG tablet Take 10 mg by mouth daily.   Yes [provider]  aspirin EC 81 MG tablet Take 81 mg by mouth daily.   Yes [provider]  ibuprofen (ADVIL) 200 MG tablet Take 400 mg by mouth every 6 (six) hours as needed for moderate pain.   Yes [provider]  influenza vaccine adjuvanted (FLUAD QUADRIVALENT) 0.5 ML injection Inject 1 each into the skin once. 01/11/19   Yes [provider]  lisinopril-hydrochlorothiazide (PRINZIDE,ZESTORETIC) 20-12.5 MG per tablet Take 2 tablets by mouth daily.    Yes [provider]  Multiple Vitamin (MULTIVITAMIN WITH MINERALS) TABS tablet Take 1 tablet by mouth daily.   Yes [provider]  pantoprazole (PROTONIX) 40 MG tablet Take 40 mg by mouth every evening.    Yes [provider]  pravastatin (PRAVACHOL) 80 MG tablet Take 80 mg by  mouth at bedtime.  12/22/14  Yes [provider]    Family History Family History  Problem Relation Age of Onset   Heart attack Father    Diabetes Mother    Heart failure Mother    Coronary artery disease Mother    Stroke Mother    Prostate cancer Brother    Heart attack Brother        also CVA   Heart attack Brother        also CVA   Cancer Brother     Social History Social History   Tobacco Use   Smoking status: Never Smoker   Smokeless tobacco: Never Used  Substance Use Topics   Alcohol use: No   Drug use: No     Allergies     Zocor [simvastatin]   Review of Systems Review of Systems  All other systems reviewed and are negative.    Physical Exam Updated Vital Signs BP (!) 157/87    Pulse 84    Temp 98.5 F (36.9 C) (Oral)    Resp 18    Ht 1.854 m (6\' 1" )    Wt 90.7 kg    SpO2 96%    BMI 26.39 kg/m   Physical Exam Vitals signs and nursing note reviewed.  Constitutional:      General: He is not in acute distress.    Appearance: He is well-developed.  HENT:     Head: Normocephalic and atraumatic.     Right Ear: External ear normal.     Left Ear: External ear normal.  Eyes:     General: No scleral icterus.       Right eye: No discharge.        Left eye: No discharge.     Conjunctiva/sclera: Conjunctivae normal.  Neck:     Musculoskeletal: Neck supple.     Trachea: No tracheal deviation.  Cardiovascular:     Rate and Rhythm: Normal rate and regular rhythm.  Pulmonary:     Effort: Pulmonary effort is normal. No respiratory distress.     Breath sounds: Normal breath sounds. No stridor. No wheezing or rales.  Abdominal:     General: Bowel sounds are normal. There is no distension.     Palpations: Abdomen is soft.     Tenderness: There is abdominal tenderness in the epigastric area. There is guarding. There is no rebound.  Musculoskeletal:        General: No tenderness.  Skin:    General: Skin is warm and dry.     Findings: No rash.  Neurological:     Mental Status: He is alert.     Cranial Nerves: No cranial nerve deficit (no facial droop, extraocular movements intact, no slurred speech).     Sensory: No sensory deficit.     Motor: No abnormal muscle tone or seizure activity.     Coordination: Coordination normal.      ED Treatments / Results  Labs (all labs ordered are listed, but only abnormal results are displayed) Labs Reviewed  CBC - Abnormal; Notable for the following components:      Result Value   WBC 16.8 (*)    All other components within normal limits  COMPREHENSIVE  METABOLIC PANEL - Abnormal; Notable for the following components:   Glucose, Bld 156 (*)    Total Bilirubin 1.4 (*)    All other components within normal limits  URINALYSIS, ROUTINE W REFLEX MICROSCOPIC - Abnormal; Notable for the following components:  Specific Gravity, Urine 1.036 (*)    Ketones, ur 20 (*)    Protein, ur 30 (*)    Bacteria, UA RARE (*)    All other components within normal limits  SARS CORONAVIRUS 2 (TAT 6-24 HRS)  LIPASE, BLOOD  TROPONIN I (HIGH SENSITIVITY)  TROPONIN I (HIGH SENSITIVITY)    EKG EKG Interpretation  Date/Time:  Tuesday February 22 2019 16:00:02 EDT Ventricular Rate:  94 PR Interval:    QRS Duration: 93 QT Interval:  354 QTC Calculation: 443 R Axis:   -35 Text Interpretation:  Sinus rhythm Left axis deviation Probable anterior infarct, age indeterminate Baseline wander in lead(s) I II aVR st elevation on prior ECG not noted, artifact no longer present Confirmed by Dorie Rank [phone]) on 02/22/2019 4:02:41 PM   Radiology Ct Abdomen Pelvis W Contrast  Result Date: 02/22/2019 CLINICAL DATA:  68 year old male with history of acute generalized abdominal pain. EXAM: CT ABDOMEN AND PELVIS WITH CONTRAST TECHNIQUE: Multidetector CT imaging of the abdomen and pelvis was performed using the standard protocol following bolus administration of intravenous contrast. CONTRAST:  14mL OMNIPAQUE IOHEXOL 300 MG/ML  SOLN COMPARISON:  CT the abdomen and pelvis 01/06/2010. FINDINGS: Lower chest: Atherosclerotic calcifications in the descending thoracic aorta as well as the left anterior descending and right coronary arteries. Elevation of the right hemidiaphragm with subsegmental atelectasis in the right lung base. Scarring or subsegmental atelectasis in the inferior segment of the lingula. Hepatobiliary: Subcentimeter low-attenuation lesion in segment 5 of the liver, too small to characterize, but statistically likely to represent tiny cysts. No other larger more  suspicious appearing cystic or solid hepatic lesions. No intra or extrahepatic biliary ductal dilatation. Calcified gallstones in the gallbladder measuring up to 7 mm. No findings to suggest an acute cholecystitis at this time. Pneumobilia, most evident throughout the left lobe of the liver, presumably from prior sphincterotomy. Pancreas: No pancreatic mass. No pancreatic ductal dilatation. No pancreatic or peripancreatic fluid collections or inflammatory changes. Spleen: Unremarkable. Adrenals/Urinary Tract: Low-attenuation nonenhancing lesions in both kidneys measuring up to 3 cm in the posterior aspect of the interpolar region of the left kidney, compatible with simple cysts. Other subcentimeter low-attenuation lesions in both kidneys, too small to characterize, but statistically likely to represent tiny cysts. Bilateral adrenal glands are normal in appearance. No hydroureteronephrosis. Urinary bladder is normal in appearance. Stomach/Bowel: Normal appearance of the stomach. Proximal and mid small bowel appear relatively decompressed. Several dilated loops of mid to distal small bowel measuring up to 4 cm in diameter with air-fluid levels. Distal and terminal ileum is decompressed. Numerous colonic diverticulae are noted, without definite surrounding inflammatory changes to suggest an acute diverticulitis at this time. The appendix is not confidently identified and may be surgically absent. Regardless, there are no inflammatory changes noted adjacent to the cecum to suggest the presence of an acute appendicitis at this time. Vascular/Lymphatic: Aortic atherosclerosis, without evidence of aneurysm or dissection in the abdominal or pelvic vasculature. No lymphadenopathy noted in the abdomen or pelvis. Reproductive: Status post radical prostatectomy. Other: No significant volume of ascites.  No pneumoperitoneum. Musculoskeletal: There are no aggressive appearing lytic or blastic lesions noted in the visualized  portions of the skeleton. IMPRESSION: 1. Segmental dilatation of portions of the mid to distal small bowel with air-fluid levels, the appearance of which suggests a potential closed loop small bowel obstruction. Surgical consultation is recommended. 2. No pneumoperitoneum to suggest perforation at this time. There is a small volume of ascites. 3. Cholelithiasis without  evidence of acute cholecystitis. 4. Colonic diverticulosis. 5. Aortic atherosclerosis, in addition to 2 vessel coronary artery disease. Please note that although the presence of coronary artery calcium documents the presence of coronary artery disease, the severity of this disease and any potential stenosis cannot be assessed on this non-gated CT examination. Assessment for potential risk factor modification, dietary therapy or pharmacologic therapy may be warranted, if clinically indicated. 6. Additional incidental findings, as above. Electronically Signed   By: Vinnie Langton M.D.   On: 02/22/2019 19:38   Dg Chest Portable 1 View  Result Date: 02/22/2019 CLINICAL DATA:  Chest, abdominal pain, vomiting EXAM: PORTABLE CHEST 1 VIEW COMPARISON:  09/24/2017 FINDINGS: Elevation of the right hemidiaphragm. Right base atelectasis or scarring. Left lung clear. Heart is normal size. No effusions or acute bony abnormality. IMPRESSION: Elevation of the right hemidiaphragm with right base atelectasis or scarring. No acute cardiopulmonary disease. Electronically Signed   By: Rolm Baptise M.D.   On: 02/22/2019 16:41    Procedures Procedures (including critical care time)  Medications Ordered in ED Medications  sodium chloride 0.9 % bolus 1,000 mL (0 mLs Intravenous Stopped 02/22/19 1701)    Followed by  0.9 %  sodium chloride infusion (1,000 mLs Intravenous New Bag/Given 02/22/19 1636)  sodium chloride (PF) 0.9 % injection (has no administration in time range)  ondansetron (ZOFRAN) injection 4 mg (has no administration in time range)  sodium  chloride flush (NS) 0.9 % injection 3 mL (3 mLs Intravenous Given 02/22/19 1630)  ondansetron (ZOFRAN) injection 4 mg (4 mg Intravenous Given 02/22/19 1631)  morphine 4 MG/ML injection 4 mg (4 mg Intravenous Given 02/22/19 1631)  iohexol (OMNIPAQUE) 300 MG/ML solution 100 mL (100 mLs Intravenous Contrast Given 02/22/19 1840)  ondansetron (ZOFRAN) injection 4 mg (4 mg Intravenous Given 02/22/19 1946)     Initial Impression / Assessment and Plan / ED Course  I have reviewed the triage vital signs and the nursing notes.  Pertinent labs & imaging results that were available during my care of the patient were reviewed by me and considered in my medical decision making (see chart for details).  Clinical Course as of Feb 22 2055  Tue Feb 22, 2019  1809 Patient's elevated white blood cell count is noted.  Patient is feeling better after treatment but on repeat exam he has pain in his upper and lower abdomen.  Will proceed with CT scan   [JK]  2052 Patient continues to have persistent nausea.  CT results are concerning for possible closed-loop obstruction.   [JK]    Clinical Course User Index [JK] Dorie Rank, MD     Patient presented with nausea vomiting abdominal pain.  His ED evaluation is notable for possible closed-loop bowel obstruction.  Patient has been given IV fluids and antiemetics.  NG tube has been ordered.  Case case discussed with Dr. Kieth Brightly general surgery.  Patient will be admitted for further treatment yes  Final Clinical Impressions(s) / ED Diagnoses   Final diagnoses:  Intestinal obstruction, unspecified cause, unspecified whether partial or complete [facility])      Dorie Rank, MD 02/22/19 2056

## 2019-02-22 NOTE — ED Triage Notes (Signed)
Pt presents with c/o abdominal pain, chest pain, diarrhea that started last night. Pt also c/o nausea that started yesterday afternoon. Pt denies any cardiac hx.

## 2019-02-22 NOTE — ED Notes (Signed)
Patient transported to X-ray 

## 2019-02-22 NOTE — H&P (Signed)
Reason for Consult:Small bowel obstruction Referring Physician: Dr. Dorie Rank  Charles Oconnor is an 68 y.o. male.  HPI:  Mr. Charles Oconnor is a 68 yo male with a history of GERD, hypertension and hyperlipidemia who presented to the ED with a 2 day history of abdominal pain, nausea, vomiting and diarrhea.  He reports that his symptoms began yesterday with "intense" epigastric abdominal pain, nausea and diarrhea.  Due to the pain and nausea he was unable to sleep last night.  This morning he took imodium for his symptoms.  He notes that diarrhea has resolved, however he has had 5 episodes of vomiting throughout the day.  In addition, his abdominal pain has progressively worsened and now radiates to the RLQ.  He states that he has never had anything like this before.  He has a history of appendicitis, and GERD, but notes that this epigastric pain is very different than his typical GERD symptoms.  He denies sick contacts.  He denies tobacco and alcohol use.    ED workup demonstrates elevated WBC of 16.8, normal lipase of 30, and elevated blood glucose of 156.  Imaging consistent with dilated portions of mid to distal small bowel up to 4 cm in diameter.  Distal and terminal ileum is decompressed.  Air in stomach wall, and evidence of portal venous gas.  Past Medical History:  Diagnosis Date   Cervical spinal stenosis    Complication of anesthesia    Coronary artery disease    mild to moderate   Dyslipidemia    ED (erectile dysfunction)    Hypertension    PONV (postoperative nausea and vomiting)    Happened once 30 yrs ago. No problems since.   Prostate cancer [facility])     Past Surgical History:  Procedure Laterality Date   ANTERIOR CERVICAL DECOMPRESSION/DISCECTOMY FUSION 4 LEVELS N/A 09/22/2017   Procedure: ACDF - C3-C4 - C4-C5 - C5-C6 - C6-C7;  Surgeon: Earnie Larsson, MD;  Location: [facility];  Service: Neurosurgery;  Laterality: N/A;   APPENDECTOMY  1985   CARDIAC CATHETERIZATION  2002,  2011   mild-mod CAD   CARDIAC CATHETERIZATION N/A 03/12/2015   Procedure: Left Heart Cath and Coronary Angiography;  Surgeon: Pixie Casino, MD;  Location: [facility];  Service: Cardiovascular;  Laterality: N/A;   HAND SURGERY Left    had joints fused   PROSTATECTOMY  03/2007   TOTAL KNEE ARTHROPLASTY  04/2009    Family History  Problem Relation Age of Onset   Heart attack Father    Diabetes Mother    Heart failure Mother    Coronary artery disease Mother    Stroke Mother    Prostate cancer Brother    Heart attack Brother        also CVA   Heart attack Brother        also CVA   Cancer Brother     Social History:  reports that he has never smoked. He has never used smokeless tobacco. He reports that he does not drink alcohol or use drugs.  Allergies:  Allergies  Allergen Reactions   Zocor [Simvastatin] Nausea Only and Other (See Comments)    Elevate liver enzymes    Medications: I have reviewed the patient's current medications.  Results for orders placed or performed during the hospital encounter of 02/22/19 (from the past 48 hour(s))  CBC     Status: Abnormal   Collection Time: 02/22/19  4:16 PM  Result Value Ref Range  WBC 16.8 (H) 4.0 - 10.5 K/uL   RBC 5.22 4.22 - 5.81 MIL/uL   Hemoglobin 15.7 13.0 - 17.0 g/dL   HCT 46.6 39.0 - 52.0 %   MCV 89.3 80.0 - 100.0 fL   MCH 30.1 26.0 - 34.0 pg   MCHC 33.7 30.0 - 36.0 g/dL   RDW 13.0 11.5 - 15.5 %   Platelets 269 150 - 400 K/uL   nRBC 0.0 0.0 - 0.2 %    Comment: Performed at [facility], [place] [address], [place], [place] [address]  Troponin I (High Sensitivity)     Status: None   Collection Time: 02/22/19  4:16 PM  Result Value Ref Range   Troponin I (High Sensitivity) 3 <18 ng/L    Comment: (NOTE) Elevated high sensitivity troponin I (hsTnI) values and significant  changes across serial measurements may suggest ACS but many other  chronic and acute conditions are known  to elevate hsTnI results.  Refer to the "Links" section for chest pain algorithms and additional  guidance. Performed at [facility], [place] [address], [place], [place] [address]   Comprehensive metabolic panel     Status: Abnormal   Collection Time: 02/22/19  4:16 PM  Result Value Ref Range   Sodium 139 135 - 145 mmol/L   Potassium 3.5 3.5 - 5.1 mmol/L   Chloride 103 98 - 111 mmol/L   CO2 23 22 - 32 mmol/L   Glucose, Bld 156 (H) 70 - 99 mg/dL   BUN 20 8 - 23 mg/dL   Creatinine, Ser 1.00 0.61 - 1.24 mg/dL   Calcium 10.2 8.9 - 10.3 mg/dL   Total Protein 7.9 6.5 - 8.1 g/dL   Albumin 4.7 3.5 - 5.0 g/dL   AST 26 15 - 41 U/L   ALT 28 0 - 44 U/L   Alkaline Phosphatase 68 38 - 126 U/L   Total Bilirubin 1.4 (H) 0.3 - 1.2 mg/dL   GFR calc non Af Amer >60 >60 mL/min   GFR calc Af Amer >60 >60 mL/min   Anion gap 13 5 - 15    Comment: Performed at [facility], [place] [address] [place], [place] [address]  Lipase, blood     Status: None   Collection Time: 02/22/19  4:16 PM  Result Value Ref Range   Lipase 30 11 - 51 U/L    Comment: Performed at [facility], [place] [address] [place], [place] [address]  Urinalysis, Routine w reflex microscopic     Status: Abnormal   Collection Time: 02/22/19  7:00 PM  Result Value Ref Range   Color, Urine YELLOW YELLOW   APPearance CLEAR CLEAR   Specific Gravity, Urine 1.036 (H) 1.005 - 1.030   pH 5.0 5.0 - 8.0   Glucose, UA NEGATIVE NEGATIVE mg/dL   Hgb urine dipstick NEGATIVE NEGATIVE   Bilirubin Urine NEGATIVE NEGATIVE   Ketones, ur 20 (A) NEGATIVE mg/dL   Protein, ur 30 (A) NEGATIVE mg/dL   Nitrite NEGATIVE NEGATIVE   Leukocytes,Ua NEGATIVE NEGATIVE   RBC / HPF 0-5 0 - 5 RBC/hpf   WBC, UA 0-5 0 - 5 WBC/hpf   Bacteria, UA RARE (A) NONE SEEN   Squamous Epithelial / LPF 0-5 0 - 5   Mucus PRESENT     Comment: Performed at [facility], [place] [address] [place], [place]  [address]  Troponin I (High Sensitivity)     Status: None   Collection  Time: 02/22/19  7:27 PM  Result Value Ref Range   Troponin I (High Sensitivity) 3 <18 ng/L    Comment: (NOTE) Elevated high sensitivity troponin I (hsTnI) values and significant  changes across serial measurements may suggest ACS but many other  chronic and acute conditions are known to elevate hsTnI results.  Refer to the "Links" section for chest pain algorithms and additional  guidance. Performed at [facility], [place] [address], [place], [place] [address]     Ct Abdomen Pelvis W Contrast  Result Date: 02/22/2019 CLINICAL DATA:  68 year old male with history of acute generalized abdominal pain. EXAM: CT ABDOMEN AND PELVIS WITH CONTRAST TECHNIQUE: Multidetector CT imaging of the abdomen and pelvis was performed using the standard protocol following bolus administration of intravenous contrast. CONTRAST:  169mL OMNIPAQUE IOHEXOL 300 MG/ML  SOLN COMPARISON:  CT the abdomen and pelvis 01/06/2010. FINDINGS: Lower chest: Atherosclerotic calcifications in the descending thoracic aorta as well as the left anterior descending and right coronary arteries. Elevation of the right hemidiaphragm with subsegmental atelectasis in the right lung base. Scarring or subsegmental atelectasis in the inferior segment of the lingula. Hepatobiliary: Subcentimeter low-attenuation lesion in segment 5 of the liver, too small to characterize, but statistically likely to represent tiny cysts. No other larger more suspicious appearing cystic or solid hepatic lesions. No intra or extrahepatic biliary ductal dilatation. Calcified gallstones in the gallbladder measuring up to 7 mm. No findings to suggest an acute cholecystitis at this time. Pneumobilia, most evident throughout the left lobe of the liver, presumably from prior sphincterotomy. Pancreas: No pancreatic mass. No pancreatic ductal dilatation. No pancreatic or peripancreatic fluid  collections or inflammatory changes. Spleen: Unremarkable. Adrenals/Urinary Tract: Low-attenuation nonenhancing lesions in both kidneys measuring up to 3 cm in the posterior aspect of the interpolar region of the left kidney, compatible with simple cysts. Other subcentimeter low-attenuation lesions in both kidneys, too small to characterize, but statistically likely to represent tiny cysts. Bilateral adrenal glands are normal in appearance. No hydroureteronephrosis. Urinary bladder is normal in appearance. Stomach/Bowel: Normal appearance of the stomach. Proximal and mid small bowel appear relatively decompressed. Several dilated loops of mid to distal small bowel measuring up to 4 cm in diameter with air-fluid levels. Distal and terminal ileum is decompressed. Numerous colonic diverticulae are noted, without definite surrounding inflammatory changes to suggest an acute diverticulitis at this time. The appendix is not confidently identified and may be surgically absent. Regardless, there are no inflammatory changes noted adjacent to the cecum to suggest the presence of an acute appendicitis at this time. Vascular/Lymphatic: Aortic atherosclerosis, without evidence of aneurysm or dissection in the abdominal or pelvic vasculature. No lymphadenopathy noted in the abdomen or pelvis. Reproductive: Status post radical prostatectomy. Other: No significant volume of ascites.  No pneumoperitoneum. Musculoskeletal: There are no aggressive appearing lytic or blastic lesions noted in the visualized portions of the skeleton. IMPRESSION: 1. Segmental dilatation of portions of the mid to distal small bowel with air-fluid levels, the appearance of which suggests a potential closed loop small bowel obstruction. Surgical consultation is recommended. 2. No pneumoperitoneum to suggest perforation at this time. There is a small volume of ascites. 3. Cholelithiasis without evidence of acute cholecystitis. 4. Colonic diverticulosis. 5.  Aortic atherosclerosis, in addition to 2 vessel coronary artery disease. Please note that although the presence of coronary artery calcium documents the presence of coronary artery disease, the severity of this disease and any potential stenosis cannot be assessed on this non-gated CT  examination. Assessment for potential risk factor modification, dietary therapy or pharmacologic therapy may be warranted, if clinically indicated. 6. Additional incidental findings, as above. Electronically Signed   By: Vinnie Langton M.D.   On: 02/22/2019 19:38   Dg Chest Portable 1 View  Result Date: 02/22/2019 CLINICAL DATA:  Chest, abdominal pain, vomiting EXAM: PORTABLE CHEST 1 VIEW COMPARISON:  09/24/2017 FINDINGS: Elevation of the right hemidiaphragm. Right base atelectasis or scarring. Left lung clear. Heart is normal size. No effusions or acute bony abnormality. IMPRESSION: Elevation of the right hemidiaphragm with right base atelectasis or scarring. No acute cardiopulmonary disease. Electronically Signed   By: Rolm Baptise M.D.   On: 02/22/2019 16:41    Review of Systems  Constitutional: Positive for malaise/fatigue. Negative for fever.  Gastrointestinal: Positive for abdominal pain (Epigastric pain and RLQ pain), diarrhea, nausea and vomiting.   Blood pressure (!) 169/100, pulse 98, temperature 98.5 F (36.9 C), temperature source Oral, resp. rate (!) 21, height 6\' 1"  (1.854 m), weight 90.7 kg, SpO2 94 %. Physical Exam  Constitutional: He is oriented to person, place, and time. He appears well-developed and well-nourished. No distress.  Cardiovascular: Normal rate and regular rhythm.  Respiratory: Effort normal and breath sounds normal.  GI: He exhibits distension. There is abdominal tenderness (especially in epigastric area).  Neurological: He is alert and oriented to person, place, and time.  Skin: Skin is warm and dry. He is not diaphoretic.  Psychiatric: He has a normal mood and affect. His  behavior is normal. Thought content normal.      Assessment/Plan: Mr. Brindle is a 68 yo male with a history of hypertension, hyperlipidemia, appendicititis, robotic prostatectomy and GERD who presented to the ED with a 2 day history of nausea, vomiting, diarrhea and severe epigastric abdominal pain radiation to the RLQ.  Leukocytosis with WBC of 16.8.  CT abdomen consistent with small bowel dilation up to 4 cm with distal ileum decompressed, air in stomach wall, and portal venous gas.    - Surgical emergency.  Patient will undergo emergent laparoscopy for evaluation and treatment of bowel obstruction.   - NPO   Suzanna Obey 02/22/2019, 10:05 PM

## 2019-02-22 NOTE — Anesthesia Preprocedure Evaluation (Addendum)
Anesthesia Evaluation  Patient identified by MRN, date of birth, ID band Patient awake    Reviewed: Unable to perform ROS - Chart review onlyPreop documentation limited or incomplete due to emergent nature of procedure.  History of Anesthesia Complications (+) PONV and history of anesthetic complications  Airway        Dental  (+) Dental Advisory Given   Pulmonary neg recent URI,           Cardiovascular hypertension, Pt. on medications + CAD       Neuro/Psych negative neurological ROS  negative psych ROS   GI/Hepatic Neg liver ROS, GERD  ,BOWEL OBSTRUCTION   Endo/Other  negative endocrine ROS  Renal/GU negative Renal ROS     Musculoskeletal negative musculoskeletal ROS (+)   Abdominal   Peds  Hematology negative hematology ROS (+)   Anesthesia Other Findings   Reproductive/Obstetrics                            Anesthesia Physical Anesthesia Plan  ASA: II and emergent  Anesthesia Plan: General   Post-op Pain Management:    Induction: Intravenous, Cricoid pressure planned and Rapid sequence  PONV Risk Score and Plan: 3 and Ondansetron and Dexamethasone  Airway Management Planned: Oral ETT  Additional Equipment: None  Intra-op Plan:   Post-operative Plan: Extubation in OR  Informed Consent: I have reviewed the patients History and Physical, chart, labs and discussed the procedure including the risks, benefits and alternatives for the proposed anesthesia with the patient or authorized representative who has indicated his/her understanding and acceptance.     Dental advisory given  Plan Discussed with: CRNA and Surgeon  Anesthesia Plan Comments:         Anesthesia Quick Evaluation

## 2019-02-23 ENCOUNTER — Encounter (HOSPITAL_COMMUNITY): Payer: Self-pay | Admitting: General Surgery

## 2019-02-23 DIAGNOSIS — K56609 Unspecified intestinal obstruction, unspecified as to partial versus complete obstruction: Secondary | ICD-10-CM | POA: Diagnosis present

## 2019-02-23 LAB — COMPREHENSIVE METABOLIC PANEL
ALT: 19 U/L (ref 0–44)
AST: 22 U/L (ref 15–41)
Albumin: 3.6 g/dL (ref 3.5–5.0)
Alkaline Phosphatase: 53 U/L (ref 38–126)
Anion gap: 11 (ref 5–15)
BUN: 19 mg/dL (ref 8–23)
CO2: 23 mmol/L (ref 22–32)
Calcium: 8.9 mg/dL (ref 8.9–10.3)
Chloride: 105 mmol/L (ref 98–111)
Creatinine, Ser: 1.01 mg/dL (ref 0.61–1.24)
GFR calc Af Amer: >60 mL/min (ref >60)
GFR calc non Af Amer: >60 mL/min (ref >60)
Glucose, Bld: 157 mg/dL — ABNORMAL HIGH (ref 70–99)
Potassium: 3.9 mmol/L (ref 3.5–5.1)
Sodium: 139 mmol/L (ref 135–145)
Total Bilirubin: 1 mg/dL (ref 0.3–1.2)
Total Protein: 6.2 g/dL — ABNORMAL LOW (ref 6.5–8.1)

## 2019-02-23 LAB — CBC
HCT: 39.6 % (ref 39.0–52.0)
Hemoglobin: 13.2 g/dL (ref 13.0–17.0)
MCH: 30.5 pg (ref 26.0–34.0)
MCHC: 33.3 g/dL (ref 30.0–36.0)
MCV: 91.5 fL (ref 80.0–100.0)
Platelets: 195 10*3/uL (ref 150–400)
RBC: 4.33 MIL/uL (ref 4.22–5.81)
RDW: 13.2 % (ref 11.5–15.5)
WBC: 13.9 10*3/uL — ABNORMAL HIGH (ref 4.0–10.5)
nRBC: 0 % (ref 0.0–0.2)

## 2019-02-23 LAB — HIV ANTIBODY (ROUTINE TESTING W REFLEX): HIV Screen 4th Generation wRfx: NONREACTIVE

## 2019-02-23 MED ORDER — ALBUTEROL SULFATE (2.5 MG/3ML) 0.083% IN NEBU
INHALATION_SOLUTION | RESPIRATORY_TRACT | Status: AC
  Administered 2019-02-23: 2.5 mg
  Filled 2019-02-23: qty 3

## 2019-02-23 MED ORDER — AMLODIPINE BESYLATE 10 MG PO TABS
10.0000 mg | ORAL_TABLET | Freq: Every day | ORAL | Status: DC
  Administered 2019-02-23: 10 mg via ORAL
  Filled 2019-02-23: qty 1

## 2019-02-23 MED ORDER — LACTATED RINGERS IV SOLN: INTRAVENOUS | Status: DC

## 2019-02-23 MED ORDER — ACETAMINOPHEN 650 MG RE SUPP: 650.0000 mg | Freq: Four times a day (QID) | RECTAL | Status: DC | PRN

## 2019-02-23 MED ORDER — ACETAMINOPHEN 160 MG/5ML PO SOLN: 1000.0000 mg | Freq: Once | ORAL | Status: DC | PRN

## 2019-02-23 MED ORDER — ACETAMINOPHEN 500 MG PO TABS: 1000.0000 mg | ORAL_TABLET | Freq: Once | ORAL | Status: DC | PRN

## 2019-02-23 MED ORDER — SUGAMMADEX SODIUM 200 MG/2ML IV SOLN
INTRAVENOUS | Status: DC | PRN
  Administered 2019-02-23: 180 mg via INTRAVENOUS

## 2019-02-23 MED ORDER — POTASSIUM CHLORIDE IN NACL 20-0.9 MEQ/L-% IV SOLN
INTRAVENOUS | Status: DC
  Administered 2019-02-23 – 2019-02-24 (×2): via INTRAVENOUS
  Filled 2019-02-23 (×3): qty 1000

## 2019-02-23 MED ORDER — ACETAMINOPHEN 10 MG/ML IV SOLN: 1000.0000 mg | Freq: Once | INTRAVENOUS | Status: DC | PRN

## 2019-02-23 MED ORDER — MORPHINE SULFATE (PF) 2 MG/ML IV SOLN: 2.0000 mg | INTRAVENOUS | Status: DC | PRN

## 2019-02-23 MED ORDER — ONDANSETRON 4 MG PO TBDP: 4.0000 mg | ORAL_TABLET | Freq: Four times a day (QID) | ORAL | Status: DC | PRN

## 2019-02-23 MED ORDER — BUPIVACAINE-EPINEPHRINE 0.25% -1:200000 IJ SOLN
INTRAMUSCULAR | Status: DC | PRN
  Administered 2019-02-23: 20 mL

## 2019-02-23 MED ORDER — ACETAMINOPHEN 325 MG PO TABS: 650.0000 mg | ORAL_TABLET | Freq: Four times a day (QID) | ORAL | Status: DC | PRN

## 2019-02-23 MED ORDER — FAMOTIDINE IN NACL 20-0.9 MG/50ML-% IV SOLN
20.0000 mg | Freq: Two times a day (BID) | INTRAVENOUS | Status: DC
  Administered 2019-02-23 (×2): 20 mg via INTRAVENOUS
  Filled 2019-02-23 (×3): qty 50

## 2019-02-23 MED ORDER — ALBUTEROL SULFATE (2.5 MG/3ML) 0.083% IN NEBU
2.5000 mg | INHALATION_SOLUTION | Freq: Once | RESPIRATORY_TRACT | Status: AC
  Administered 2019-02-23: 2.5 mg via RESPIRATORY_TRACT

## 2019-02-23 MED ORDER — OXYCODONE HCL 5 MG PO TABS: 5.0000 mg | ORAL_TABLET | Freq: Once | ORAL | Status: DC | PRN

## 2019-02-23 MED ORDER — HYDRALAZINE HCL 20 MG/ML IJ SOLN
10.0000 mg | Freq: Four times a day (QID) | INTRAMUSCULAR | Status: DC
  Filled 2019-02-23 (×2): qty 1

## 2019-02-23 MED ORDER — ONDANSETRON HCL 4 MG/2ML IJ SOLN
INTRAMUSCULAR | Status: DC | PRN
  Administered 2019-02-23: 4 mg via INTRAVENOUS

## 2019-02-23 MED ORDER — METOPROLOL TARTRATE 5 MG/5ML IV SOLN: 5.0000 mg | Freq: Four times a day (QID) | INTRAVENOUS | Status: DC | PRN

## 2019-02-23 MED ORDER — FENTANYL CITRATE (PF) 100 MCG/2ML IJ SOLN: 25.0000 ug | INTRAMUSCULAR | Status: DC | PRN

## 2019-02-23 MED ORDER — OXYCODONE HCL 5 MG/5ML PO SOLN: 5.0000 mg | Freq: Once | ORAL | Status: DC | PRN

## 2019-02-23 MED ORDER — ONDANSETRON HCL 4 MG/2ML IJ SOLN: 4.0000 mg | Freq: Four times a day (QID) | INTRAMUSCULAR | Status: DC | PRN

## 2019-02-23 MED ORDER — ENOXAPARIN SODIUM 40 MG/0.4ML ~~LOC~~ SOLN
40.0000 mg | SUBCUTANEOUS | Status: DC
  Administered 2019-02-23 – 2019-02-24 (×2): 40 mg via SUBCUTANEOUS
  Filled 2019-02-23 (×2): qty 0.4

## 2019-02-23 MED ORDER — OXYCODONE HCL 5 MG PO TABS: 5.0000 mg | ORAL_TABLET | ORAL | Status: DC | PRN

## 2019-02-23 MED ORDER — SODIUM CHLORIDE 0.9 % IV SOLN: INTRAVENOUS | Status: DC

## 2019-02-23 NOTE — Care Management Important Message (Signed)
Important Message  Patient Details  Name: Charles Oconnor MRN: [id number] Date of Birth: 01/14/1951   Medicare Important Message Given:  Yes. CMA printed out IM for Case Management Nurse or CSW to give to patient.      [name] 02/23/2019, 8:51 AM

## 2019-02-23 NOTE — Transfer of Care (Signed)
Immediate Anesthesia Transfer of Care Note  Patient: Charles Oconnor  Procedure(s) Performed: Procedure(s): LAPAROSCOPIC COLON RESECTION AND DIVERTICULECTOMY (N/A) LAPAROSCOPIC LYSIS OF ADHESIONS (N/A)  Patient Location: PACU  Anesthesia Type:General  Level of Consciousness:  sedated, patient cooperative and responds to stimulation  Airway & Oxygen Therapy:Patient Spontanous Breathing and Patient connected to face mask oxgen  Post-op Assessment:  Report given to PACU RN and Post -op Vital signs reviewed and stable  Post vital signs:  Reviewed and stable  Last Vitals:  Vitals:   02/22/19 2200 02/22/19 2230  BP: (!) 155/94 (!) 153/88  Pulse: 98 96  Resp: 18 17  Temp:    SpO2: [id number]    Complications: No apparent anesthesia complications

## 2019-02-23 NOTE — Op Note (Signed)
Preoperative diagnosis: small bowel obstruction  Postoperative diagnosis: adhesive small bowel obstruction, meckel's diverticulum  Procedure: laparoscopic lysis of adhesions, meckel's diverticulectomy  Surgeon: Gurney Maxin, M.D.  Asst: Jeralyn Bennett  Anesthesia: general  Indications for procedure: Charles Oconnor is a 68 y.o. year old male with symptoms of nausea and vomiting and diarrhea. He came to the ER and was found to have a bowel obstruction with portal venous gas concerning for ischemia and was therefore taken to the operating room.  Description of procedure: The patient was brought into the operative suite. Anesthesia was administered with General endotracheal anesthesia. WHO checklist was applied. The patient was then placed in supine position. The area was prepped and draped in the usual sterile fashion.  A small left subcostal incision was made. A 45mm trocar was used to gain access to the peritoneal cavity by optical entry technique. Pneumoperitoneum was applied with a high flow and low pressure. The laparoscope was reinserted to confirm position.  On initial visualization of the abdomen there were multiple loops of dilated small bowel.  There is still good domain and found to continue laparoscopically.  Therefore 2 additional 5 mm trochars were placed one in the left midabdomen one in the left lower abdomen.  Left-sided TIP block was performed with 10 mm of Marcaine.  The small intestine was run from the ileocecal valve back approximately approximately 3 to 4 feet from the ileocecal valve there was a large size diverticulum.  Moving more proximally there was a band of omentum to a right colon appendage causing a internal hernia loop.  This band was lysed with cautery.  This allowed improved mobilization of the small intestine.  The remainder of the small intestine up to the ligament of Treitz appeared normal.  Due to concern for portal gas and pneumatosis of the stomach, we  then turned our attention towards stomach.  Stomach did appear dilated with NG tube in good position.  Made a window into the lesser omentum to get into the lesser sac in the stomach appeared viable in all locations.  There is no purulence surrounding the stomach.  Decision was made to perform diverticulectomy.  Therefore previous scar was incised and cautery was used to dissect down through subcutaneous tissues and the fascia was entered.  A wound protector was put in place.  The small intestine was then brought through this area such that the diverticulum came out as well.  Diverticulum was then excised the enterotomy was closed with 3-0 PDS in running fashion and then imbricated with 3-0 silk Lembert's.  Small intestine was then put back in place this wound was closed with 0 PDS in running fashion.  We reinsufflated the abdomen and took 1 more look running the small intestine in its entirety which now appeared normal and the repair appeared intact.  Pneumo insufflation was then removed.  All ports removed.  All skin incisions were closed with 4 Monocryl subcuticular stitch.  Dermabond was put in place dressing.  Foley was removed.  All counts are correct.  Patient woke up from anesthesia brought to PACU in stable condition.  Findings: Meckel's diverticulum, adhesion causing small bowel obstruction  Specimen: Meckel's diverticulum  Implant: none   Blood loss: 20 ml  Local anesthesia: 10 ml marcaine   Complications: none  Gurney Maxin, M.D. General, Bariatric, & Minimally Invasive Surgery [facility]

## 2019-02-23 NOTE — Progress Notes (Signed)
Patient was able to ambulate up and down hallway once and complained of pain and wanted to return to bed but pain did not require pai medication.

## 2019-02-23 NOTE — Progress Notes (Signed)
Patient ID: Charles Oconnor, male   DOB: 1950-07-08, 68 y.o.   MRN: [id number]    1 Day Post-Op  Subjective: Pain on the left side of abdomen where all of his incisions are.  No flatus yet.  Pain seems well controlled so far.  Voiding well.  Objective: Vital signs in last 24 hours: Temp:  [98.4 F (36.9 C)-99.2 F (37.3 C)] 99.1 F (37.3 C) (10/14 0702) Pulse Rate:  [72-98] 75 (10/14 0702) Resp:  [16-34] 18 (10/14 0702) BP: (124-169)/(74-100) 124/74 (10/14 0702) SpO2:  [92 %-98 %] 96 % (10/14 0702) Weight:  [90.7 kg] 90.7 kg (10/13 1553)    Intake/Output from previous day: 10/13 0701 - 10/14 0700 In: 4421.4 [I.V.:3411.6; IV Piggyback:1009.8] Out: 1055 [Urine:150; Emesis/NG output:900; Blood:5] Intake/Output this shift: Total I/O In: -  Out: 175 [Urine:175]  PE: Heart: regular Lungs: CTAB Abd: soft, absent BS, NGT with minimal output, but NGT wall mount isn't working well, will need to be switched, appropriately tender, incisions are all c/d/i with dermabond present.  Lab Results:  Recent Labs    02/22/19 1616 02/23/19 0600  WBC 16.8* 13.9*  HGB 15.7 13.2  HCT 46.6 39.6  PLT 269 195   BMET Recent Labs    02/22/19 1616 02/23/19 0600  NA 139 139  K 3.5 3.9  CL 103 105  CO2 23 23  GLUCOSE 156* 157*  BUN 20 19  CREATININE 1.00 1.01  CALCIUM 10.2 8.9   PT/INR No results for input(s): LABPROT, INR in the last 72 hours. CMP     Component Value Date/Time   NA 139 02/23/2019 0600   K 3.9 02/23/2019 0600   CL 105 02/23/2019 0600   CO2 23 02/23/2019 0600   GLUCOSE 157 (H) 02/23/2019 0600   BUN 19 02/23/2019 0600   CREATININE 1.01 02/23/2019 0600   CREATININE 1.05 03/07/2015 1110   CALCIUM 8.9 02/23/2019 0600   PROT 6.2 (L) 02/23/2019 0600   ALBUMIN 3.6 02/23/2019 0600   AST 22 02/23/2019 0600   ALT 19 02/23/2019 0600   ALKPHOS 53 02/23/2019 0600   BILITOT 1.0 02/23/2019 0600   GFRNONAA >60 02/23/2019 0600   GFRAA >60 02/23/2019 0600   Lipase      Component Value Date/Time   LIPASE 30 02/22/2019 1616       Studies/Results: Ct Abdomen Pelvis W Contrast  Addendum Date: 02/22/2019   ADDENDUM REPORT: 02/22/2019 23:01 ADDENDUM: Please note there is a large amount of portal venous gas extending into the left portal vein and peripheral portal venous branches in the left lobe. There is thickened and edematous appearance of the wall of the stomach primarily involving the dependent wall of the gastric fundus and gastric body. There are pockets of air along the dependent wall of the stomach most consistent with pneumatosis. There is a focal area of inflammatory changes adjacent to the greater curvature of the body of the stomach. There is a thin linear air within a small vessel adjacent to the greater curvature of the stomach (coronal series 5 image 50 and 51 and axial series 2, image 62). Dilated loops of small bowel the right hemiabdomen with air-fluid levels as previously described. These likely represent a reactive ileus and less likely a small-bowel obstruction. The above findings were discussed with Dr. Kieth Brightly at 10 p.m. on 02/22/2019. Electronically Signed   By: Anner Crete M.D.   On: 02/22/2019 23:01   Result Date: 02/22/2019 CLINICAL DATA:  68 year old male with history of acute  generalized abdominal pain. EXAM: CT ABDOMEN AND PELVIS WITH CONTRAST TECHNIQUE: Multidetector CT imaging of the abdomen and pelvis was performed using the standard protocol following bolus administration of intravenous contrast. CONTRAST:  134mL OMNIPAQUE IOHEXOL 300 MG/ML  SOLN COMPARISON:  CT the abdomen and pelvis 01/06/2010. FINDINGS: Lower chest: Atherosclerotic calcifications in the descending thoracic aorta as well as the left anterior descending and right coronary arteries. Elevation of the right hemidiaphragm with subsegmental atelectasis in the right lung base. Scarring or subsegmental atelectasis in the inferior segment of the lingula. Hepatobiliary:  Subcentimeter low-attenuation lesion in segment 5 of the liver, too small to characterize, but statistically likely to represent tiny cysts. No other larger more suspicious appearing cystic or solid hepatic lesions. No intra or extrahepatic biliary ductal dilatation. Calcified gallstones in the gallbladder measuring up to 7 mm. No findings to suggest an acute cholecystitis at this time. Pneumobilia, most evident throughout the left lobe of the liver, presumably from prior sphincterotomy. Pancreas: No pancreatic mass. No pancreatic ductal dilatation. No pancreatic or peripancreatic fluid collections or inflammatory changes. Spleen: Unremarkable. Adrenals/Urinary Tract: Low-attenuation nonenhancing lesions in both kidneys measuring up to 3 cm in the posterior aspect of the interpolar region of the left kidney, compatible with simple cysts. Other subcentimeter low-attenuation lesions in both kidneys, too small to characterize, but statistically likely to represent tiny cysts. Bilateral adrenal glands are normal in appearance. No hydroureteronephrosis. Urinary bladder is normal in appearance. Stomach/Bowel: Normal appearance of the stomach. Proximal and mid small bowel appear relatively decompressed. Several dilated loops of mid to distal small bowel measuring up to 4 cm in diameter with air-fluid levels. Distal and terminal ileum is decompressed. Numerous colonic diverticulae are noted, without definite surrounding inflammatory changes to suggest an acute diverticulitis at this time. The appendix is not confidently identified and may be surgically absent. Regardless, there are no inflammatory changes noted adjacent to the cecum to suggest the presence of an acute appendicitis at this time. Vascular/Lymphatic: Aortic atherosclerosis, without evidence of aneurysm or dissection in the abdominal or pelvic vasculature. No lymphadenopathy noted in the abdomen or pelvis. Reproductive: Status post radical prostatectomy. Other:  No significant volume of ascites.  No pneumoperitoneum. Musculoskeletal: There are no aggressive appearing lytic or blastic lesions noted in the visualized portions of the skeleton. IMPRESSION: 1. Segmental dilatation of portions of the mid to distal small bowel with air-fluid levels, the appearance of which suggests a potential closed loop small bowel obstruction. Surgical consultation is recommended. 2. No pneumoperitoneum to suggest perforation at this time. There is a small volume of ascites. 3. Cholelithiasis without evidence of acute cholecystitis. 4. Colonic diverticulosis. 5. Aortic atherosclerosis, in addition to 2 vessel coronary artery disease. Please note that although the presence of coronary artery calcium documents the presence of coronary artery disease, the severity of this disease and any potential stenosis cannot be assessed on this non-gated CT examination. Assessment for potential risk factor modification, dietary therapy or pharmacologic therapy may be warranted, if clinically indicated. 6. Additional incidental findings, as above. Electronically Signed: By: Vinnie Langton M.D. On: 02/22/2019 19:38   Dg Chest Portable 1 View  Result Date: 02/22/2019 CLINICAL DATA:  Chest, abdominal pain, vomiting EXAM: PORTABLE CHEST 1 VIEW COMPARISON:  09/24/2017 FINDINGS: Elevation of the right hemidiaphragm. Right base atelectasis or scarring. Left lung clear. Heart is normal size. No effusions or acute bony abnormality. IMPRESSION: Elevation of the right hemidiaphragm with right base atelectasis or scarring. No acute cardiopulmonary disease. Electronically Signed  By: Rolm Baptise M.D.   On: 02/22/2019 16:41    Anti-infectives: Anti-infectives (From admission, onward)   Start     Dose/Rate Route Frequency Ordered Stop   02/22/19 2215  piperacillin-tazobactam (ZOSYN) IVPB 3.375 g     3.375 g 12.5 mL/hr over 240 Minutes Intravenous Every 8 hours 02/22/19 2203         Assessment/Plan HTN  - change oral norvasc to hydralazine IV while NGT in place  POD 0, s/p LAO for SBO with Meckel's diverticulectomy, Dr. Kieth Brightly 02/23/19 -await bowel function and continue NGT for now -mobilize -pulm toilet -likely tx to 5w   FEN - NPO,NGT, IVFs VTE - Lovenox ID - none   LOS: 1 day    Henreitta Cea , [facility] 02/23/2019, 10:33 AM Please see Amion for pager number during day hours 7:00am-4:30pm

## 2019-02-23 NOTE — Anesthesia Procedure Notes (Signed)
Procedure Name: Intubation Date/Time: 02/23/2019 11:22 PM Performed by: Anne Fu, CRNA Pre-anesthesia Checklist: Patient identified, Emergency Drugs available, Suction available, Patient being monitored and Timeout performed Patient Re-evaluated:Patient Re-evaluated prior to induction Oxygen Delivery Method: Circle system utilized Preoxygenation: Pre-oxygenation with 100% oxygen Induction Type: IV induction, Cricoid Pressure applied and Rapid sequence Laryngoscope Size: Mac and 4 Grade View: Grade I Tube type: Oral Tube size: 7.5 mm Number of attempts: 1 Airway Equipment and Method: Stylet and Video-laryngoscopy Placement Confirmation: ETT inserted through vocal cords under direct vision,  positive ETCO2 and breath sounds checked- equal and bilateral Secured at: 23 cm Tube secured with: Tape Dental Injury: Teeth and Oropharynx as per pre-operative assessment

## 2019-02-24 LAB — COMPREHENSIVE METABOLIC PANEL
ALT: 20 U/L (ref 0–44)
AST: 27 U/L (ref 15–41)
Albumin: 3.2 g/dL — ABNORMAL LOW (ref 3.5–5.0)
Alkaline Phosphatase: 41 U/L (ref 38–126)
Anion gap: 8 (ref 5–15)
BUN: 23 mg/dL (ref 8–23)
CO2: 23 mmol/L (ref 22–32)
Calcium: 8.6 mg/dL — ABNORMAL LOW (ref 8.9–10.3)
Chloride: 108 mmol/L (ref 98–111)
Creatinine, Ser: 1.24 mg/dL (ref 0.61–1.24)
GFR calc Af Amer: >60 mL/min (ref >60)
GFR calc non Af Amer: 59 mL/min — ABNORMAL LOW (ref >60)
Glucose, Bld: 116 mg/dL — ABNORMAL HIGH (ref 70–99)
Potassium: 3.6 mmol/L (ref 3.5–5.1)
Sodium: 139 mmol/L (ref 135–145)
Total Bilirubin: 1.3 mg/dL — ABNORMAL HIGH (ref 0.3–1.2)
Total Protein: 5.8 g/dL — ABNORMAL LOW (ref 6.5–8.1)

## 2019-02-24 LAB — CBC
HCT: 38 % — ABNORMAL LOW (ref 39.0–52.0)
Hemoglobin: 12.3 g/dL — ABNORMAL LOW (ref 13.0–17.0)
MCH: 30.4 pg (ref 26.0–34.0)
MCHC: 32.4 g/dL (ref 30.0–36.0)
MCV: 93.8 fL (ref 80.0–100.0)
Platelets: 187 10*3/uL (ref 150–400)
RBC: 4.05 MIL/uL — ABNORMAL LOW (ref 4.22–5.81)
RDW: 13.4 % (ref 11.5–15.5)
WBC: 14 10*3/uL — ABNORMAL HIGH (ref 4.0–10.5)
nRBC: 0 % (ref 0.0–0.2)

## 2019-02-24 LAB — SURGICAL PATHOLOGY

## 2019-02-24 MED ORDER — FAMOTIDINE 20 MG PO TABS
20.0000 mg | ORAL_TABLET | Freq: Every day | ORAL | Status: DC
  Administered 2019-02-24 – 2019-02-25 (×2): 20 mg via ORAL
  Filled 2019-02-24 (×2): qty 1

## 2019-02-24 MED ORDER — ACETAMINOPHEN 500 MG PO TABS
1000.0000 mg | ORAL_TABLET | Freq: Four times a day (QID) | ORAL | Status: DC
  Administered 2019-02-24 – 2019-02-25 (×4): 1000 mg via ORAL
  Filled 2019-02-24 (×5): qty 2

## 2019-02-24 MED ORDER — AMLODIPINE BESYLATE 10 MG PO TABS
10.0000 mg | ORAL_TABLET | Freq: Every day | ORAL | Status: DC
  Administered 2019-02-24 – 2019-02-25 (×2): 10 mg via ORAL
  Filled 2019-02-24 (×2): qty 1

## 2019-02-24 NOTE — Progress Notes (Signed)
Patient ID: Charles Oconnor, male   DOB: 12/16/1950, 68 y.o.   MRN: [id number]    2 Days Post-Op  Subjective: Patient looks great today.  Passing a lot of flatus.  Pain well controlled.    Objective: Vital signs in last 24 hours: Temp:  [99 F (37.2 C)-100.5 F (38.1 C)] 99.3 F (37.4 C) (10/15 0711) Pulse Rate:  [69-93] 78 (10/15 0711) Resp:  [17-20] 17 (10/15 0711) BP: (124-139)/(69-84) 139/79 (10/15 0711) SpO2:  [88 %-97 %] 89 % (10/15 0711) Last BM Date: 02/21/19  Intake/Output from previous day: 10/14 0701 - 10/15 0700 In: 1363.6 [I.V.:1104.9; IV Piggyback:258.6] Out: 1325 [Urine:1325] Intake/Output this shift: Total I/O In: -  Out: 250 [Urine:250]  PE: Heart: regular Lungs: CTAB Abd: soft, appropriately tender, great BS, ND, incisions c/d/i with dermabond  Lab Results:  Recent Labs    02/23/19 0600 02/24/19 0513  WBC 13.9* 14.0*  HGB 13.2 12.3*  HCT 39.6 38.0*  PLT 195 187   BMET Recent Labs    02/23/19 0600 02/24/19 0513  NA 139 139  K 3.9 3.6  CL 105 108  CO2 23 23  GLUCOSE 157* 116*  BUN 19 23  CREATININE 1.01 1.24  CALCIUM 8.9 8.6*   PT/INR No results for input(s): LABPROT, INR in the last 72 hours. CMP     Component Value Date/Time   NA 139 02/24/2019 0513   K 3.6 02/24/2019 0513   CL 108 02/24/2019 0513   CO2 23 02/24/2019 0513   GLUCOSE 116 (H) 02/24/2019 0513   BUN 23 02/24/2019 0513   CREATININE 1.24 02/24/2019 0513   CREATININE 1.05 03/07/2015 1110   CALCIUM 8.6 (L) 02/24/2019 0513   PROT 5.8 (L) 02/24/2019 0513   ALBUMIN 3.2 (L) 02/24/2019 0513   AST 27 02/24/2019 0513   ALT 20 02/24/2019 0513   ALKPHOS 41 02/24/2019 0513   BILITOT 1.3 (H) 02/24/2019 0513   GFRNONAA 59 (L) 02/24/2019 0513   GFRAA >60 02/24/2019 0513   Lipase     Component Value Date/Time   LIPASE 30 02/22/2019 1616       Studies/Results: Ct Abdomen Pelvis W Contrast  Addendum Date: 02/22/2019   ADDENDUM REPORT: 02/22/2019 23:01 ADDENDUM:  Please note there is a large amount of portal venous gas extending into the left portal vein and peripheral portal venous branches in the left lobe. There is thickened and edematous appearance of the wall of the stomach primarily involving the dependent wall of the gastric fundus and gastric body. There are pockets of air along the dependent wall of the stomach most consistent with pneumatosis. There is a focal area of inflammatory changes adjacent to the greater curvature of the body of the stomach. There is a thin linear air within a small vessel adjacent to the greater curvature of the stomach (coronal series 5 image 50 and 51 and axial series 2, image 62). Dilated loops of small bowel the right hemiabdomen with air-fluid levels as previously described. These likely represent a reactive ileus and less likely a small-bowel obstruction. The above findings were discussed with Dr. Kieth Brightly at 10 p.m. on 02/22/2019. Electronically Signed   By: Anner Crete M.D.   On: 02/22/2019 23:01   Result Date: 02/22/2019 CLINICAL DATA:  68 year old male with history of acute generalized abdominal pain. EXAM: CT ABDOMEN AND PELVIS WITH CONTRAST TECHNIQUE: Multidetector CT imaging of the abdomen and pelvis was performed using the standard protocol following bolus administration of intravenous contrast. CONTRAST:  138mL  OMNIPAQUE IOHEXOL 300 MG/ML  SOLN COMPARISON:  CT the abdomen and pelvis 01/06/2010. FINDINGS: Lower chest: Atherosclerotic calcifications in the descending thoracic aorta as well as the left anterior descending and right coronary arteries. Elevation of the right hemidiaphragm with subsegmental atelectasis in the right lung base. Scarring or subsegmental atelectasis in the inferior segment of the lingula. Hepatobiliary: Subcentimeter low-attenuation lesion in segment 5 of the liver, too small to characterize, but statistically likely to represent tiny cysts. No other larger more suspicious appearing cystic or  solid hepatic lesions. No intra or extrahepatic biliary ductal dilatation. Calcified gallstones in the gallbladder measuring up to 7 mm. No findings to suggest an acute cholecystitis at this time. Pneumobilia, most evident throughout the left lobe of the liver, presumably from prior sphincterotomy. Pancreas: No pancreatic mass. No pancreatic ductal dilatation. No pancreatic or peripancreatic fluid collections or inflammatory changes. Spleen: Unremarkable. Adrenals/Urinary Tract: Low-attenuation nonenhancing lesions in both kidneys measuring up to 3 cm in the posterior aspect of the interpolar region of the left kidney, compatible with simple cysts. Other subcentimeter low-attenuation lesions in both kidneys, too small to characterize, but statistically likely to represent tiny cysts. Bilateral adrenal glands are normal in appearance. No hydroureteronephrosis. Urinary bladder is normal in appearance. Stomach/Bowel: Normal appearance of the stomach. Proximal and mid small bowel appear relatively decompressed. Several dilated loops of mid to distal small bowel measuring up to 4 cm in diameter with air-fluid levels. Distal and terminal ileum is decompressed. Numerous colonic diverticulae are noted, without definite surrounding inflammatory changes to suggest an acute diverticulitis at this time. The appendix is not confidently identified and may be surgically absent. Regardless, there are no inflammatory changes noted adjacent to the cecum to suggest the presence of an acute appendicitis at this time. Vascular/Lymphatic: Aortic atherosclerosis, without evidence of aneurysm or dissection in the abdominal or pelvic vasculature. No lymphadenopathy noted in the abdomen or pelvis. Reproductive: Status post radical prostatectomy. Other: No significant volume of ascites.  No pneumoperitoneum. Musculoskeletal: There are no aggressive appearing lytic or blastic lesions noted in the visualized portions of the skeleton.  IMPRESSION: 1. Segmental dilatation of portions of the mid to distal small bowel with air-fluid levels, the appearance of which suggests a potential closed loop small bowel obstruction. Surgical consultation is recommended. 2. No pneumoperitoneum to suggest perforation at this time. There is a small volume of ascites. 3. Cholelithiasis without evidence of acute cholecystitis. 4. Colonic diverticulosis. 5. Aortic atherosclerosis, in addition to 2 vessel coronary artery disease. Please note that although the presence of coronary artery calcium documents the presence of coronary artery disease, the severity of this disease and any potential stenosis cannot be assessed on this non-gated CT examination. Assessment for potential risk factor modification, dietary therapy or pharmacologic therapy may be warranted, if clinically indicated. 6. Additional incidental findings, as above. Electronically Signed: By: Vinnie Langton M.D. On: 02/22/2019 19:38   Dg Chest Portable 1 View  Result Date: 02/22/2019 CLINICAL DATA:  Chest, abdominal pain, vomiting EXAM: PORTABLE CHEST 1 VIEW COMPARISON:  09/24/2017 FINDINGS: Elevation of the right hemidiaphragm. Right base atelectasis or scarring. Left lung clear. Heart is normal size. No effusions or acute bony abnormality. IMPRESSION: Elevation of the right hemidiaphragm with right base atelectasis or scarring. No acute cardiopulmonary disease. Electronically Signed   By: Rolm Baptise M.D.   On: 02/22/2019 16:41    Anti-infectives: Anti-infectives (From admission, onward)   Start     Dose/Rate Route Frequency Ordered Stop   02/22/19 2215  piperacillin-tazobactam (ZOSYN) IVPB 3.375 g     3.375 g 12.5 mL/hr over 240 Minutes Intravenous Every 8 hours 02/22/19 2203         Assessment/Plan HTN - resume oral home meds  POD 1, s/p LAO for SBO with Meckel's diverticulectomy, Dr. Kieth Brightly 02/23/19 -DC NGT, CLD -mobilize, pulm toilet  FEN - CLD, IVFs at [place].  Cr 1.24,  check in am  VTE - Lovenox ID - none   LOS: 2 days    Henreitta Cea , [facility] 02/24/2019, 10:11 AM Please see Amion for pager number during day hours 7:00am-4:30pm

## 2019-02-25 LAB — BASIC METABOLIC PANEL
Anion gap: 8 (ref 5–15)
BUN: 13 mg/dL (ref 8–23)
CO2: 25 mmol/L (ref 22–32)
Calcium: 8.7 mg/dL — ABNORMAL LOW (ref 8.9–10.3)
Chloride: 108 mmol/L (ref 98–111)
Creatinine, Ser: 1 mg/dL (ref 0.61–1.24)
GFR calc Af Amer: >60 mL/min (ref >60)
GFR calc non Af Amer: >60 mL/min (ref >60)
Glucose, Bld: 106 mg/dL — ABNORMAL HIGH (ref 70–99)
Potassium: 3.6 mmol/L (ref 3.5–5.1)
Sodium: 141 mmol/L (ref 135–145)

## 2019-02-25 LAB — CBC
HCT: 38.4 % — ABNORMAL LOW (ref 39.0–52.0)
Hemoglobin: 12.6 g/dL — ABNORMAL LOW (ref 13.0–17.0)
MCH: 30.4 pg (ref 26.0–34.0)
MCHC: 32.8 g/dL (ref 30.0–36.0)
MCV: 92.5 fL (ref 80.0–100.0)
Platelets: 177 10*3/uL (ref 150–400)
RBC: 4.15 MIL/uL — ABNORMAL LOW (ref 4.22–5.81)
RDW: 13.2 % (ref 11.5–15.5)
WBC: 9.7 10*3/uL (ref 4.0–10.5)
nRBC: 0 % (ref 0.0–0.2)

## 2019-02-25 MED ORDER — ACETAMINOPHEN 500 MG PO TABS: 1000.0000 mg | ORAL_TABLET | Freq: Four times a day (QID) | ORAL | 0 refills | Status: DC

## 2019-02-25 MED ORDER — OXYCODONE HCL 5 MG PO TABS: 5.0000 mg | ORAL_TABLET | ORAL | 0 refills | Status: DC | PRN

## 2019-02-25 NOTE — Discharge Summary (Signed)
Patient ID: Charles Oconnor [id number] [birth year] 68 y.o.  Admit date: 02/22/2019 Discharge date: 02/25/2019  Admitting Diagnosis: SBO HTN  Discharge Diagnosis Patient Active Problem List   Diagnosis Date Noted  . Small bowel obstruction ([place]) 02/23/2019  . Respiratory distress following surgery 09/24/2017  . Cervical spondylosis with radiculopathy 09/22/2017  . Viral URI with cough 04/01/2016  . Chest pain, unspecified   . HTN (hypertension) 01/13/2013  . Dyslipidemia 01/13/2013  . CAD (coronary artery disease) 01/13/2013  Meckel's diverticulum  Consultants none  Reason for Admission: Charles Oconnor is a 68 yo male with a history of GERD, hypertension and hyperlipidemia who presented to the ED with a 2 day history of abdominal pain, nausea, vomiting and diarrhea.  He reports that his symptoms began yesterday with "intense" epigastric abdominal pain, nausea and diarrhea.  Due to the pain and nausea he was unable to sleep last night.  This morning he took imodium for his symptoms.  He notes that diarrhea has resolved, however he has had 5 episodes of vomiting throughout the day.  In addition, his abdominal pain has progressively worsened and now radiates to the RLQ.  He states that he has never had anything like this before.  He has a history of appendicitis, and GERD, but notes that this epigastric pain is very different than his typical GERD symptoms.  He denies sick contacts.  He denies tobacco and alcohol use.    ED workup demonstrates elevated WBC of 16.8, normal lipase of 30, and elevated blood glucose of 156.  Imaging consistent with dilated portions of mid to distal small bowel up to 4 cm in diameter.  Distal and terminal ileum is decompressed.  Air in stomach wall, and evidence of portal venous gas.  Procedures Laparoscopic lysis of adhesions, Meckel's diverticulectomy  Hospital Course:  The patient was admitted and went to he OR given the portal venous gas noted  on his CT scan.  He was found to have a band adhesion causing his obstruction as well as a Meckel's diverticulum.  The band was lysed and the diverticulum was resected.  The patient tolerated the procedure well.  He had an NGT placed which remained in on POD 1 along with zosyn for abx coverage.  By POD 2, bowel function returned and his diet was able to be advanced as tolerated.  He was stable for DC home on POD 3, tolerating a solid diet, voiding, mobilizing, and with good pain control.  His other medical problems remained stable during his stay.  Physical Exam: Heart: regular Lungs: CTAB Abd: soft, appropriately tender, +BS, ND, incisions c/d/i  Allergies as of 02/25/2019      Reactions   Zocor [simvastatin] Nausea Only, Other (See Comments)   Elevate liver enzymes      Medication List    TAKE these medications   acetaminophen 500 MG tablet Commonly known as: TYLENOL Take 2 tablets (1,000 mg total) by mouth every 6 (six) hours.   Advil 200 MG tablet Generic drug: ibuprofen Take 400 mg by mouth every 6 (six) hours as needed for moderate pain.   amLODipine 10 MG tablet Commonly known as: NORVASC Take 10 mg by mouth daily.   aspirin EC 81 MG tablet Take 81 mg by mouth daily.   Fluad Quadrivalent 0.5 ML injection Generic drug: influenza vaccine adjuvanted Inject 1 each into the skin once. 01/11/19   lisinopril-hydrochlorothiazide 20-12.5 MG tablet Commonly known as: ZESTORETIC Take 2 tablets by mouth daily.  multivitamin with minerals Tabs tablet Take 1 tablet by mouth daily.   oxyCODONE 5 MG immediate release tablet Commonly known as: Oxy IR/ROXICODONE Take 1 tablet (5 mg total) by mouth every 4 (four) hours as needed for moderate pain.   pantoprazole 40 MG tablet Commonly known as: PROTONIX Take 40 mg by mouth every evening.   pravastatin 80 MG tablet Commonly known as: PRAVACHOL Take 80 mg by mouth at bedtime.        Follow-up Information    Kinsinger, Arta Bruce, MD Follow up in 3 week(s).   Specialty: General Surgery Why: our office will call you with appointment date and time Contact information: [place] [address] [phone]           Signed: Saverio Danker, [facility] 02/25/2019, 8:02 AM Please see Amion for pager number during day hours 7:00am-4:30pm

## 2019-02-25 NOTE — Discharge Instructions (Signed)
[facility]  Please arrive at least 30 min before your appointment to complete your check in paperwork.  If you are unable to arrive 30 min prior to your appointment time we may have to cancel or reschedule you. LAPAROSCOPIC SURGERY: POST OP INSTRUCTIONS Always review your discharge instruction sheet given to you by the facility where your surgery was performed. IF YOU HAVE DISABILITY OR FAMILY LEAVE FORMS, YOU MUST BRING THEM TO THE OFFICE FOR PROCESSING.   DO NOT GIVE THEM TO YOUR DOCTOR.  PAIN CONTROL  1. First take acetaminophen (Tylenol) AND/or ibuprofen (Advil) to control your pain after surgery.  Follow directions on package.  Taking acetaminophen (Tylenol) and/or ibuprofen (Advil) regularly after surgery will help to control your pain and lower the amount of prescription pain medication you may need.  You should not take more than 4,000 mg (4 grams) of acetaminophen (Tylenol) in 24 hours.  You should not take ibuprofen (Advil), aleve, motrin, naprosyn or other NSAIDS if you have a history of stomach ulcers or chronic kidney disease.  2. A prescription for pain medication may be given to you upon discharge.  Take your pain medication as prescribed, if you still have uncontrolled pain after taking acetaminophen (Tylenol) or ibuprofen (Advil). 3. Use ice packs to help control pain. 4. If you need a refill on your pain medication, please contact your pharmacy.  They will contact our office to request authorization. Prescriptions will not be filled after 5pm or on week-ends.  HOME MEDICATIONS 5. Take your usually prescribed medications unless otherwise directed.  DIET 6. You should follow a light diet the first few days after arrival home.  Be sure to include lots of fluids daily. Avoid fatty, fried foods.   CONSTIPATION 7. It is common to experience some constipation after surgery and if you are taking pain medication.  Increasing fluid intake and taking a stool  softener (such as Colace) will usually help or prevent this problem from occurring.  A mild laxative (Milk of Magnesia or Miralax) should be taken according to package instructions if there are no bowel movements after 48 hours.  WOUND/INCISION CARE 8. Most patients will experience some swelling and bruising in the area of the incisions.  Ice packs will help.  Swelling and bruising can take several days to resolve.  9. Unless discharge instructions indicate otherwise, follow guidelines below  a. STERI-STRIPS - you may remove your outer bandages 48 hours after surgery, and you may shower at that time.  You have steri-strips (small skin tapes) in place directly over the incision.  These strips should be left on the skin for 7-10 days.   b. DERMABOND/SKIN GLUE - you may shower in 24 hours.  The glue will flake off over the next 2-3 weeks. 10. Any sutures or staples will be removed at the office during your follow-up visit.  ACTIVITIES 11. You may resume regular (light) daily activities beginning the next day--such as daily self-care, walking, climbing stairs--gradually increasing activities as tolerated.  You may have sexual intercourse when it is comfortable.  Refrain from any heavy lifting or straining until approved by your doctor. a. You may drive when you are no longer taking prescription pain medication, you can comfortably wear a seatbelt, and you can safely maneuver your car and apply brakes.  FOLLOW-UP 12. You should see your doctor in the office for a follow-up appointment approximately 2-3 weeks after your surgery.  You should have been given your post-op/follow-up appointment when   your surgery was scheduled.  If you did not receive a post-op/follow-up appointment, make sure that you call for this appointment within a day or two after you arrive home to insure a convenient appointment time.   WHEN TO CALL YOUR DOCTOR: 1. Fever over 101.0 2. Inability to urinate 3. Continued bleeding from  incision. 4. Increased pain, redness, or drainage from the incision. 5. Increasing abdominal pain  The clinic staff is available to answer your questions during regular business hours.  Please don't hesitate to call and ask to speak to one of the nurses for clinical concerns.  If you have a medical emergency, go to the nearest emergency room or call 911.  A surgeon from [facility] is always on call at the hospital. [address], [address], [place], [place]  [address] ? [address] [address], [place], [place]   [address] [phone] ? [phone] ? FAX [phone]  .........   Managing Your Pain After Surgery Without Opioids    Thank you for participating in our program to help patients manage their pain after surgery without opioids. This is part of our effort to provide you with the best care possible, without exposing you or your family to the risk that opioids pose.  What pain can I expect after surgery? You can expect to have some pain after surgery. This is normal. The pain is typically worse the day after surgery, and quickly begins to get better. Many studies have found that many patients are able to manage their pain after surgery with Over-the-Counter (OTC) medications such as Tylenol and Motrin. If you have a condition that does not allow you to take Tylenol or Motrin, notify your surgical team.  How will I manage my pain? The best strategy for controlling your pain after surgery is around the clock pain control with Tylenol (acetaminophen) and Motrin (ibuprofen or Advil). Alternating these medications with each other allows you to maximize your pain control. In addition to Tylenol and Motrin, you can use heating pads or ice packs on your incisions to help reduce your pain.  How will I alternate your regular strength over-the-counter pain medication? You will take a dose of pain medication every three hours. ; Start by taking 650 mg of Tylenol (2 pills of 325  mg) ; 3 hours later take 600 mg of Motrin (3 pills of 200 mg) ; 3 hours after taking the Motrin take 650 mg of Tylenol ; 3 hours after that take 600 mg of Motrin.   - 1 -  See example - if your first dose of Tylenol is at 12:00 PM   12:00 PM Tylenol 650 mg (2 pills of 325 mg)  3:00 PM Motrin 600 mg (3 pills of 200 mg)  6:00 PM Tylenol 650 mg (2 pills of 325 mg)  9:00 PM Motrin 600 mg (3 pills of 200 mg)  Continue alternating every 3 hours   We recommend that you follow this schedule around-the-clock for at least 3 days after surgery, or until you feel that it is no longer needed. Use the table on the last page of this handout to keep track of the medications you are taking. Important: Do not take more than 3000mg of Tylenol or 3200mg of Motrin in a 24-hour period. Do not take ibuprofen/Motrin if you have a history of bleeding stomach ulcers, severe kidney disease, &/or actively taking a blood thinner  What if I still have pain? If you have pain that is not   controlled with the over-the-counter pain medications (Tylenol and Motrin or Advil) you might have what we call "breakthrough" pain. You will receive a prescription for a small amount of an opioid pain medication such as Oxycodone, Tramadol, or Tylenol with Codeine. Use these opioid pills in the first 24 hours after surgery if you have breakthrough pain. Do not take more than 1 pill every 4-6 hours.  If you still have uncontrolled pain after using all opioid pills, don't hesitate to call our staff using the number provided. We will help make sure you are managing your pain in the best way possible, and if necessary, we can provide a prescription for additional pain medication.   Day 1    Time  Name of Medication Number of pills taken  Amount of Acetaminophen  Pain Level   Comments  AM PM       AM PM       AM PM       AM PM       AM PM       AM PM       AM PM       AM PM       Total Daily amount of Acetaminophen Do not  take more than  3,000 mg per day      Day 2    Time  Name of Medication Number of pills taken  Amount of Acetaminophen  Pain Level   Comments  AM PM       AM PM       AM PM       AM PM       AM PM       AM PM       AM PM       AM PM       Total Daily amount of Acetaminophen Do not take more than  3,000 mg per day      Day 3    Time  Name of Medication Number of pills taken  Amount of Acetaminophen  Pain Level   Comments  AM PM       AM PM       AM PM       AM PM          AM PM       AM PM       AM PM       AM PM       Total Daily amount of Acetaminophen Do not take more than  3,000 mg per day      Day 4    Time  Name of Medication Number of pills taken  Amount of Acetaminophen  Pain Level   Comments  AM PM       AM PM       AM PM       AM PM       AM PM       AM PM       AM PM       AM PM       Total Daily amount of Acetaminophen Do not take more than  3,000 mg per day      Day 5    Time  Name of Medication Number of pills taken  Amount of Acetaminophen  Pain Level   Comments  AM PM       AM PM       AM   PM       AM PM       AM PM       AM PM       AM PM       AM PM       Total Daily amount of Acetaminophen Do not take more than  3,000 mg per day       Day 6    Time  Name of Medication Number of pills taken  Amount of Acetaminophen  Pain Level  Comments  AM PM       AM PM       AM PM       AM PM       AM PM       AM PM       AM PM       AM PM       Total Daily amount of Acetaminophen Do not take more than  3,000 mg per day      Day 7    Time  Name of Medication Number of pills taken  Amount of Acetaminophen  Pain Level   Comments  AM PM       AM PM       AM PM       AM PM       AM PM       AM PM       AM PM       AM PM       Total Daily amount of Acetaminophen Do not take more than  3,000 mg per day        For additional information about how and where to safely dispose of unused  opioid medications - https://www.morepowerfulnc.org  Disclaimer: This document contains information and/or instructional materials adapted from [facility] for the typical patient with your condition. It does not replace medical advice from your health care provider because your experience may differ from that of the typical patient. Talk to your health care provider if you have any questions about this document, your condition or your treatment plan. Adapted from [facility]   

## 2019-03-03 NOTE — Anesthesia Postprocedure Evaluation (Signed)
Anesthesia Post Note  Patient: Charles Oconnor  Procedure(s) Performed: LAPAROSCOPIC COLON RESECTION AND DIVERTICULECTOMY (N/A Abdomen) LAPAROSCOPIC LYSIS OF ADHESIONS (N/A Abdomen)     Patient location during evaluation: PACU Anesthesia Type: General Level of consciousness: awake and alert Pain management: pain level controlled Vital Signs Assessment: post-procedure vital signs reviewed and stable Respiratory status: spontaneous breathing, nonlabored ventilation, respiratory function stable and patient connected to nasal cannula oxygen Cardiovascular status: blood pressure returned to baseline and stable Postop Assessment: no apparent nausea or vomiting Anesthetic complications: no    Last Vitals:  Vitals:   02/24/19 2151 02/25/19 0609  BP: 122/70 138/82  Pulse: 69 73  Resp: 20 20  Temp: 37.3 C 37.4 C  SpO2: 92% 92%    Last Pain:  Vitals:   02/25/19 0744  TempSrc:   PainSc: 0-No pain                 Sarea Fyfe

## 2019-03-23 ENCOUNTER — Encounter: Payer: Self-pay | Admitting: Internal Medicine

## 2019-03-23 ENCOUNTER — Other Ambulatory Visit: Payer: Self-pay

## 2019-03-23 ENCOUNTER — Ambulatory Visit (INDEPENDENT_AMBULATORY_CARE_PROVIDER_SITE_OTHER): Payer: Medicare Other | Admitting: Internal Medicine

## 2019-03-23 VITALS — BP 139/84 | HR 74 | Ht 73.0 in | Wt 194.1 lb

## 2019-03-23 DIAGNOSIS — I2583 Coronary atherosclerosis due to lipid rich plaque: Secondary | ICD-10-CM | POA: Diagnosis not present

## 2019-03-23 DIAGNOSIS — R0602 Shortness of breath: Secondary | ICD-10-CM | POA: Diagnosis not present

## 2019-03-23 DIAGNOSIS — R072 Precordial pain: Secondary | ICD-10-CM | POA: Diagnosis not present

## 2019-03-23 DIAGNOSIS — I10 Essential (primary) hypertension: Secondary | ICD-10-CM

## 2019-03-23 DIAGNOSIS — Z01812 Encounter for preprocedural laboratory examination: Secondary | ICD-10-CM

## 2019-03-23 DIAGNOSIS — I2510 Atherosclerotic heart disease of native coronary artery without angina pectoris: Secondary | ICD-10-CM | POA: Diagnosis not present

## 2019-03-23 DIAGNOSIS — E785 Hyperlipidemia, unspecified: Secondary | ICD-10-CM

## 2019-03-23 MED ORDER — METOPROLOL TARTRATE 100 MG PO TABS: ORAL_TABLET | ORAL | 0 refills | Status: DC

## 2019-03-23 NOTE — Patient Instructions (Signed)
Medication Instructions:  Your physician recommends that you continue on your current medications as directed. Please refer to the Current Medication list given to you today.  *If you need a refill on your cardiac medications before your next appointment, please call your pharmacy*  Lab Work: BMET prior to CT test If you have labs (blood work) drawn today and your tests are completely normal, you will receive your results only by: Marland Kitchen MyChart Message (if you have MyChart) OR . A paper copy in the mail If you have any lab test that is abnormal or we need to change your treatment, we will call you to review the results.  Testing/Procedures: Dr. Debara Pickett has ordered the following tests: -- echocardiogram - done at [address] -- coronary CT - done at [facility]  Follow-Up: At [facility], you and your health needs are our priority.  As part of our continuing mission to provide you with exceptional heart care, we have created designated Provider Care Teams.  These Care Teams include your primary Cardiologist (physician) and Advanced Practice Providers (APPs -  Physician Assistants and Nurse Practitioners) who all work together to provide you with the care you need, when you need it.  Your next appointment:   6-8 weeks  The format for your next appointment:   In Person  Provider:   You may see Dr. Debara Pickett or one of the following Advanced Practice Providers on your designated Care Team:    [name], PA-C  Fabian Sharp, [place] or   Roby Lofts, [place]   Other Instructions  Your cardiac CT will be scheduled at one of the below locations:   [facility] [address] [place], [place] [address] [phone]   Please follow these instructions carefully (unless otherwise directed):  Hold all erectile dysfunction medications at least 3 days (72 hrs) prior to test.  On the Night Before the Test: . Be sure to Drink plenty of water. . Do not consume any  caffeinated/decaffeinated beverages or chocolate 12 hours prior to your test. . Do not take any antihistamines 12 hours prior to your test.  On the Day of the Test: . Drink plenty of water. Do not drink any water within one hour of the test. . Do not eat any food 4 hours prior to the test. . You may take your regular medications prior to the test.  . Take metoprolol (Lopressor) two hours prior to test. . HOLD Furosemide/Hydrochlorothiazide morning of the test.   After the Test: . Drink plenty of water. . After receiving IV contrast, you may experience a mild flushed feeling. This is normal. . On occasion, you may experience a mild rash up to 24 hours after the test. This is not dangerous. If this occurs, you can take Benadryl 25 mg and increase your fluid intake. . If you experience trouble breathing, this can be serious. If it is severe call 911 IMMEDIATELY. If it is mild, please call our office. . If you take any of these medications: Glipizide/Metformin, Avandament, Glucavance, please do not take 48 hours after completing test unless otherwise instructed.   Once we have confirmed authorization from your insurance company, we will call you to set up a date and time for your test.   For non-scheduling related questions, please contact the cardiac imaging nurse navigator should you have any questions/concerns: Marchia Bond, RN Navigator Cardiac Imaging [facility] [phone] Office

## 2019-03-23 NOTE — Progress Notes (Signed)
OFFICE NOTE  Chief Complaint:  Progressive shortness of breath, chest pressure  Primary Care Physician: Mayra Neer, MD  HPI:  Charles Oconnor  is a 68 year old gentleman with history of hypertension, dyslipidemia and mild to moderate coronary disease. We have been following him for dyslipidemia and he has had liver enzyme abnormalities before on Zocor and was changed to Pravachol with pretty good control. Sometimes he gets chest discomfort; however, it is pretty short lived and possibly related to that. His EKG today shows a sinus bradycardia at 54 and I understand recently he was placed on a beta blocker which caused marked sinus bradycardia and that was promptly discontinued. At this point, although there is possibly an occasion for that given his history of coronary disease, his heart rate being low, he is unable to tolerate a beta blocker. He is on an ACE inhibitor which is pretty good at controlling his blood pressure as well as amlodipine.   Charles Oconnor returns today for followup. He reports doing fairly well. He's recently started doing some walking and has actually lost about 10 pounds in the last 6 months. He says he does feel better and have more energy. A portion is developed some peripheral neuropathy. He's been started on Neurontin by a neurologist in [place]. He seems to be tolerating this medicine although has some occasional lightheadedness and dizziness. He is peripheral nerve pain is better however does have numbness in his feet. He occasionally gets some mild swelling. He also was told that he had high vitamin B6 levels and was told to stop taking his multivitamin.  I the pleasure see Charles Oconnor back in the office today. Overall he is doing fairly well except he is struggling with low back pain and some neuropathic pain in his legs. He's been getting back injections without much improvement. He's also been describing some occasional chest discomfort and progressive  fatigue over the past several months. He feels like he has no energy. There is also complaints of mid back pain between his shoulder blades which occurs on unusual occasions and not associated with his low back pain. Sometimes it's a little worse when exerting himself although he's not been as active due to his back problems. As a reminder he had heart catheterization in 2011 with Dr. Tina Griffiths, which demonstrated mild to moderate coronary artery disease.  He recently had lab work through his primary care provider which looks fairly normal except for an elevated cholesterol profile. His total cholesterol is 166, HL 43, triglycerides 112 and LDL 101. He's currently on pravastatin 80 mg daily and says that he has had problems with myalgias and liver function abnormalities on simvastatin in the past.  Charles Oconnor returns today for follow-up. He recently underwent a stress test which showed the following:   The left ventricular ejection fraction is mildly decreased (45-54%).  Nuclear stress EF: 52%.  No T wave inversion was noted during stress.  There was no ST segment deviation noted during stress.  Defect 1: There is a medium defect of moderate severity.  There is a moderate-sized and intensity, partially reversible inferoseptal perfusion defect which could represent ischemia. LVEF 52% with basal inferoseptal dyskinesis. This is an intermediate risk study.   Based on these findings, I recommended either continued medical therapy or we could consider cardiac catheterization. Subsequently he felt like his chest pain was worsening and ultimately based on his prior catheterization results, we decided to proceed with cardiac catheterization. The results are as follows:  Prox RCA lesion, 40% stenosed. The lesion was not previously treated.  Prox Cx to Dist Cx lesion, 20% stenosed. The lesion was not previously treated.  Ost 1st Diag to 1st Diag lesion, 40% stenosed. The lesion was not previously  treated.  Dist LAD lesion, 20% stenosed. The lesion was not previously treated.  The left ventricular systolic function is normal.  Mild, non-obstructive CAD - somewhat improved from prior cath in 2011. Normal LV function. False positive nuclear stress test. At this point, Charles Oconnor is doing much better and denies any chest pain or worsening shortness of breath. He gets a small amount of ankle edema which I attribute to amlodipine.  04/01/2016  Charles Oconnor returns today for follow-up. He denies any chest pain or worsening shortness of breath. He apparently had a sore throat yesterday and has some drainage today. He is interested in some over-the-counter medications for that. He's had no chest pain that sounds anginal. Blood pressure well controlled. Recent cholesterol check through his primary care provider shows good control with LDL less than 100.  03/13/2017  Charles Oconnor returns today for follow-up.  Overall he is doing exceedingly well.  Blood pressure is well-controlled today 126/70.  His body mass index is near normal.  Cholesterol is due to be assessed but has been well controlled.  EKG shows sinus bradycardia at 52 without ischemic changes.  He denies any chest pain or worsening shortness of breath.  03/19/2018  Charles Oconnor is seen today in follow-up.  He recently underwent cervical surgery for pain.  He had a fusion of the lower cervical vertebrae by Dr. Trenton Gammon and is doing much better.  He did have postoperative swelling and had steroids because of difficulty breathing.  Lipids a year ago were well controlled.  He has an upcoming appoint with Dr. Manuella Ghazi who will check his cholesterol in about 2 months.  I asked him to send [place] those records.  He denies any chest pain or shortness of breath.  Blood pressure is well controlled.  He did recently retire.  03/23/2019  Charles Oconnor is seen today for follow-up.  He reports some progressively worsening shortness of breath with exertion.  He said  this has been going on for several months however recently had an intestinal obstruction.  He said he was up in [place] and started having symptoms and then came here and went to [place] long.  A CT scan indicated small bowel obstruction.  He has a history of prior surgeries including a robotic prostatectomy and appendectomy and therefore had surgical adhesions.  He is improving from that but does report that he has had some worsening symptoms.  He is concerned about coronary disease.  I performed his last heart catheterization in 2016 which showed some mild to moderate nonobstructive coronary disease.  His arteries actually looked somewhat better than it had previously.  His most recent lipid profile showed total cholesterol 153, triglycerides 179, HDL 39 and LDL 78.  PMHx:  Past Medical History:  Diagnosis Date  . Cervical spinal stenosis   . Complication of anesthesia   . Coronary artery disease    mild to moderate  . Dyslipidemia   . ED (erectile dysfunction)   . Hypertension   . PONV (postoperative nausea and vomiting)    Happened once 30 yrs ago. No problems since.  . Prostate cancer [facility])     Past Surgical History:  Procedure Laterality Date  . ANTERIOR CERVICAL DECOMPRESSION/DISCECTOMY FUSION 4 LEVELS N/A 09/22/2017  Procedure: ACDF - C3-C4 - C4-C5 - C5-C6 - C6-C7;  Surgeon: Earnie Larsson, MD;  Location: [facility];  Service: Neurosurgery;  Laterality: N/A;  . APPENDECTOMY  1985  . CARDIAC CATHETERIZATION  2002, 2011   mild-mod CAD  . CARDIAC CATHETERIZATION N/A 03/12/2015   Procedure: Left Heart Cath and Coronary Angiography;  Surgeon: Pixie Casino, MD;  Location: [facility];  Service: Cardiovascular;  Laterality: N/A;  . COLON RESECTION N/A 02/22/2019   Procedure: LAPAROSCOPIC COLON RESECTION AND DIVERTICULECTOMY;  Surgeon: Kieth Brightly, Arta Bruce, MD;  Location: [facility];  Service: General;  Laterality: N/A;  . HAND SURGERY Left    had joints fused  . LAPAROSCOPIC LYSIS  OF ADHESIONS N/A 02/22/2019   Procedure: LAPAROSCOPIC LYSIS OF ADHESIONS;  Surgeon: Kieth Brightly Arta Bruce, MD;  Location: [facility];  Service: General;  Laterality: N/A;  . PROSTATECTOMY  03/2007  . TOTAL KNEE ARTHROPLASTY  04/2009    FAMHx:  Family History  Problem Relation Age of Onset  . Heart attack Father   . Diabetes Mother   . Heart failure Mother   . Coronary artery disease Mother   . Stroke Mother   . Prostate cancer Brother   . Heart attack Brother        also CVA  . Heart attack Brother        also CVA  . Cancer Brother     SOCHx:   reports that he has never smoked. He has never used smokeless tobacco. He reports that he does not drink alcohol or use drugs.  ALLERGIES:  Allergies  Allergen Reactions  . Zocor [Simvastatin] Nausea Only and Other (See Comments)    Elevate liver enzymes    ROS: Pertinent items noted in HPI and remainder of comprehensive ROS otherwise negative.  HOME MEDS: Current Outpatient Medications  Medication Sig Dispense Refill  . acetaminophen (TYLENOL) 500 MG tablet Take 2 tablets (1,000 mg total) by mouth every 6 (six) hours. 30 tablet 0  . amLODipine (NORVASC) 10 MG tablet Take 10 mg by mouth daily.    Marland Kitchen aspirin EC 81 MG tablet Take 81 mg by mouth daily.    Marland Kitchen ibuprofen (ADVIL) 200 MG tablet Take 400 mg by mouth every 6 (six) hours as needed for moderate pain.    . influenza vaccine adjuvanted (FLUAD QUADRIVALENT) 0.5 ML injection Inject 1 each into the skin once. 01/11/19    . lisinopril-hydrochlorothiazide (PRINZIDE,ZESTORETIC) 20-12.5 MG per tablet Take 2 tablets by mouth daily.     . Multiple Vitamin (MULTIVITAMIN WITH MINERALS) TABS tablet Take 1 tablet by mouth daily.    Marland Kitchen oxyCODONE (OXY IR/ROXICODONE) 5 MG immediate release tablet Take 1 tablet (5 mg total) by mouth every 4 (four) hours as needed for moderate pain. 10 tablet 0  . pantoprazole (PROTONIX) 40 MG tablet Take 40 mg by mouth every evening.     . pravastatin (PRAVACHOL) 80 MG  tablet Take 80 mg by mouth at bedtime.      No current facility-administered medications for this visit.     LABS/IMAGING: No results found for this or any previous visit (from the past 48 hour(s)). No results found.  VITALS: BP 139/84   Pulse 74   Ht 6\' 1"  (1.854 m)   Wt 194 lb 1.6 oz (88 kg)   SpO2 97%   BMI 25.61 kg/m   EXAM: General appearance: alert, no distress and thin Neck: no carotid bruit, no JVD, thyroid not enlarged, symmetric, no tenderness/mass/nodules and  Right anterior neck base scar Lungs: clear to auscultation bilaterally Heart: regular rate and rhythm, S1, S2 normal, no murmur, click, rub or gallop Abdomen: soft, non-tender; bowel sounds normal; no masses,  no organomegaly Extremities: extremities normal, atraumatic, no cyanosis or edema Pulses: 2+ and symmetric Skin: Skin color, texture, turgor normal. No rashes or lesions Neurologic: Grossly normal Psych: Pleasant  EKG: Sinus rhythm at 74, pulmonary disease pattern, nonspecific ST changes-personally reviewed  ASSESSMENT: 1. Progressive dyspnea on exertion/chest pressure 2. Mild nonobstructive coronary disease by cath (2016), somewhat improved compared to his prior study in 2011 3. Hypertension-controlled 4. Dyslipidemia - controlled  PLAN: 1.   Charles Oconnor has had some progressive dyspnea on exertion and chest pressure without chest pain or clear anginal symptoms.  He had some nonobstructive disease by cath performed by myself in 2016.  It is possible he has had some progression of his coronary disease.  Also he is noted to have somewhat of a pulmonary disease pattern on his EKG, suggesting perhaps pulmonary hypertension or some RV dysfunction.  I like to go ahead and get an echocardiogram and will arrange for CT coronary angiogram with FFR.  If there is significant obstruction, then he will need a repeat cath.  He recently had a CT of the abdomen for his intestinal obstruction, this demonstrated  two-vessel coronary calcification.  Finally, as noted that he did get through his abdominal surgery without any cardiac complications that were aware of suggesting that at least last month he did not have any significant flow-limiting stenoses.  Follow-up with me in 1 month or sooner as necessary.  Pixie Casino, MD, [facility], [place] Director of the Advanced Lipid Disorders &  Cardiovascular Risk Reduction Clinic Diplomate of the American Board of Clinical Lipidology Attending Cardiologist  Direct Dial: [phone]  Fax: [phone]  Website:  [email or web address] 03/23/2019, 1:35 PM

## 2019-03-24 DIAGNOSIS — M1712 Unilateral primary osteoarthritis, left knee: Secondary | ICD-10-CM | POA: Diagnosis not present

## 2019-03-24 DIAGNOSIS — M25562 Pain in left knee: Secondary | ICD-10-CM | POA: Diagnosis not present

## 2019-03-31 ENCOUNTER — Ambulatory Visit (HOSPITAL_COMMUNITY): Payer: Medicare Other | Attending: Cardiology

## 2019-03-31 ENCOUNTER — Other Ambulatory Visit: Payer: Self-pay

## 2019-03-31 DIAGNOSIS — R0602 Shortness of breath: Secondary | ICD-10-CM | POA: Diagnosis not present

## 2019-04-18 DIAGNOSIS — Z01812 Encounter for preprocedural laboratory examination: Secondary | ICD-10-CM | POA: Diagnosis not present

## 2019-04-18 LAB — BASIC METABOLIC PANEL
BUN/Creatinine Ratio: 15 (ref 10–24)
BUN: 17 mg/dL (ref 8–27)
CO2: 24 mmol/L (ref 20–29)
Calcium: 10.2 mg/dL (ref 8.6–10.2)
Chloride: 102 mmol/L (ref 96–106)
Creatinine, Ser: 1.16 mg/dL (ref 0.76–1.27)
GFR calc Af Amer: 74 mL/min/{1.73_m2} (ref >59)
GFR calc non Af Amer: 64 mL/min/{1.73_m2} (ref >59)
Glucose: 115 mg/dL — ABNORMAL HIGH (ref 65–99)
Potassium: 4.3 mmol/L (ref 3.5–5.2)
Sodium: 143 mmol/L (ref 134–144)

## 2019-04-20 ENCOUNTER — Encounter (HOSPITAL_COMMUNITY): Payer: Self-pay

## 2019-04-20 ENCOUNTER — Telehealth (HOSPITAL_COMMUNITY): Payer: Self-pay | Admitting: Emergency Medicine

## 2019-04-20 NOTE — Telephone Encounter (Signed)
Reaching out to patient to offer assistance regarding upcoming cardiac imaging study; pt verbalizes understanding of appt date/time, parking situation and where to check in, pre-test NPO status and medications ordered, and verified current allergies; name and call back number provided for further questions should they arise Dezi Schaner RN Navigator Cardiac Imaging [facility] [phone] office [phone] cell 

## 2019-04-21 ENCOUNTER — Other Ambulatory Visit: Payer: Self-pay

## 2019-04-21 ENCOUNTER — Ambulatory Visit: Payer: Medicare Other

## 2019-04-21 ENCOUNTER — Ambulatory Visit
Admission: RE | Admit: 2019-04-21 | Discharge: 2019-04-21 | Disposition: A | Discharge status: Home / Self Care | Payer: Medicare Other | Source: Ambulatory Visit | Attending: Internal Medicine | Admitting: Internal Medicine

## 2019-04-21 DIAGNOSIS — R072 Precordial pain: Secondary | ICD-10-CM | POA: Insufficient documentation

## 2019-04-21 DIAGNOSIS — R0602 Shortness of breath: Secondary | ICD-10-CM | POA: Diagnosis not present

## 2019-04-21 MED ORDER — NITROGLYCERIN 0.4 MG SL SUBL
0.8000 mg | SUBLINGUAL_TABLET | Freq: Once | SUBLINGUAL | Status: AC
  Administered 2019-04-21: 0.8 mg via SUBLINGUAL

## 2019-04-21 MED ORDER — IOHEXOL 350 MG/ML SOLN
100.0000 mL | Freq: Once | INTRAVENOUS | Status: AC | PRN
  Administered 2019-04-21: 85 mL via INTRAVENOUS

## 2019-04-21 NOTE — Progress Notes (Signed)
Patient tolerated CT without incident. Did not want anything to eat or drink. Ambulatory to exit steady gait.

## 2019-04-22 DIAGNOSIS — R072 Precordial pain: Secondary | ICD-10-CM | POA: Diagnosis not present

## 2019-04-22 DIAGNOSIS — R0602 Shortness of breath: Secondary | ICD-10-CM | POA: Diagnosis not present

## 2019-05-10 ENCOUNTER — Encounter: Payer: Self-pay | Admitting: Internal Medicine

## 2019-05-10 ENCOUNTER — Ambulatory Visit (INDEPENDENT_AMBULATORY_CARE_PROVIDER_SITE_OTHER): Payer: Medicare Other | Admitting: Internal Medicine

## 2019-05-10 ENCOUNTER — Other Ambulatory Visit: Payer: Self-pay

## 2019-05-10 VITALS — BP 142/76 | HR 71 | Temp 97.2°F | Ht 73.0 in | Wt 201.2 lb

## 2019-05-10 DIAGNOSIS — R0602 Shortness of breath: Secondary | ICD-10-CM

## 2019-05-10 DIAGNOSIS — Z01810 Encounter for preprocedural cardiovascular examination: Secondary | ICD-10-CM | POA: Diagnosis not present

## 2019-05-10 DIAGNOSIS — I2510 Atherosclerotic heart disease of native coronary artery without angina pectoris: Secondary | ICD-10-CM | POA: Diagnosis not present

## 2019-05-10 DIAGNOSIS — I2583 Coronary atherosclerosis due to lipid rich plaque: Secondary | ICD-10-CM

## 2019-05-10 DIAGNOSIS — E785 Hyperlipidemia, unspecified: Secondary | ICD-10-CM | POA: Diagnosis not present

## 2019-05-10 MED ORDER — ROSUVASTATIN CALCIUM 40 MG PO TABS: 40.0000 mg | ORAL_TABLET | Freq: Every day | ORAL | 3 refills | Status: DC

## 2019-05-10 NOTE — Patient Instructions (Signed)
Medication Instructions:  START CRESTOR 40MG  DAILY STOP PRAVASTATIN  *If you need a refill on your cardiac medications before your next appointment, please call your pharmacy*  Lab Work: Your physician recommends that you return for lab work in 3 months (Fasting Lipid)  If you have labs (blood work) drawn today and your tests are completely normal, you will receive your results only by: Marland Kitchen MyChart Message (if you have MyChart) OR . A paper copy in the mail If you have any lab test that is abnormal or we need to change your treatment, we will call you to review the results.  Testing/Procedures: NONE NEEDED  Follow-Up: At [facility], you and your health needs are our priority.  As part of our continuing mission to provide you with exceptional heart care, we have created designated Provider Care Teams.  These Care Teams include your primary Cardiologist (physician) and Advanced Practice Providers (APPs -  Physician Assistants and Nurse Practitioners) who all work together to provide you with the care you need, when you need it.  Your next appointment:   3 month(s)  The format for your next appointment:   In Person  Provider:   K. Mali Hilty, MD

## 2019-05-10 NOTE — Progress Notes (Signed)
OFFICE NOTE  Chief Complaint:  Follow-up CT  Primary Care Physician: Mayra Neer, MD  HPI:  Charles Oconnor  is a 68 year old gentleman with history of hypertension, dyslipidemia and mild to moderate coronary disease. We have been following him for dyslipidemia and he has had liver enzyme abnormalities before on Zocor and was changed to Pravachol with pretty good control. Sometimes he gets chest discomfort; however, it is pretty short lived and possibly related to that. His EKG today shows a sinus bradycardia at 54 and I understand recently he was placed on a beta blocker which caused marked sinus bradycardia and that was promptly discontinued. At this point, although there is possibly an occasion for that given his history of coronary disease, his heart rate being low, he is unable to tolerate a beta blocker. He is on an ACE inhibitor which is pretty good at controlling his blood pressure as well as amlodipine.   Charles Oconnor returns today for followup. He reports doing fairly well. He's recently started doing some walking and has actually lost about 10 pounds in the last 6 months. He says he does feel better and have more energy. A portion is developed some peripheral neuropathy. He's been started on Neurontin by a neurologist in [place]. He seems to be tolerating this medicine although has some occasional lightheadedness and dizziness. He is peripheral nerve pain is better however does have numbness in his feet. He occasionally gets some mild swelling. He also was told that he had high vitamin B6 levels and was told to stop taking his multivitamin.  I the pleasure see Charles Oconnor back in the office today. Overall he is doing fairly well except he is struggling with low back pain and some neuropathic pain in his legs. He's been getting back injections without much improvement. He's also been describing some occasional chest discomfort and progressive fatigue over the past several  months. He feels like he has no energy. There is also complaints of mid back pain between his shoulder blades which occurs on unusual occasions and not associated with his low back pain. Sometimes it's a little worse when exerting himself although he's not been as active due to his back problems. As a reminder he had heart catheterization in 2011 with Dr. Tina Griffiths, which demonstrated mild to moderate coronary artery disease.  He recently had lab work through his primary care provider which looks fairly normal except for an elevated cholesterol profile. His total cholesterol is 166, HL 43, triglycerides 112 and LDL 101. He's currently on pravastatin 80 mg daily and says that he has had problems with myalgias and liver function abnormalities on simvastatin in the past.  Mr. Summersett returns today for follow-up. He recently underwent a stress test which showed the following:   The left ventricular ejection fraction is mildly decreased (45-54%).  Nuclear stress EF: 52%.  No T wave inversion was noted during stress.  There was no ST segment deviation noted during stress.  Defect 1: There is a medium defect of moderate severity.  There is a moderate-sized and intensity, partially reversible inferoseptal perfusion defect which could represent ischemia. LVEF 52% with basal inferoseptal dyskinesis. This is an intermediate risk study.   Based on these findings, I recommended either continued medical therapy or we could consider cardiac catheterization. Subsequently he felt like his chest pain was worsening and ultimately based on his prior catheterization results, we decided to proceed with cardiac catheterization. The results are as follows:   Prox RCA  lesion, 40% stenosed. The lesion was not previously treated.  Prox Cx to Dist Cx lesion, 20% stenosed. The lesion was not previously treated.  Ost 1st Diag to 1st Diag lesion, 40% stenosed. The lesion was not previously treated.  Dist LAD lesion, 20%  stenosed. The lesion was not previously treated.  The left ventricular systolic function is normal.  Mild, non-obstructive CAD - somewhat improved from prior cath in 2011. Normal LV function. False positive nuclear stress test. At this point, Charles Oconnor is doing much better and denies any chest pain or worsening shortness of breath. He gets a small amount of ankle edema which I attribute to amlodipine.  04/01/2016  Charles Oconnor returns today for follow-up. He denies any chest pain or worsening shortness of breath. He apparently had a sore throat yesterday and has some drainage today. He is interested in some over-the-counter medications for that. He's had no chest pain that sounds anginal. Blood pressure well controlled. Recent cholesterol check through his primary care provider shows good control with LDL less than 100.  03/13/2017  Charles Oconnor returns today for follow-up.  Overall he is doing exceedingly well.  Blood pressure is well-controlled today 126/70.  His body mass index is near normal.  Cholesterol is due to be assessed but has been well controlled.  EKG shows sinus bradycardia at 52 without ischemic changes.  He denies any chest pain or worsening shortness of breath.  03/19/2018  Charles Oconnor is seen today in follow-up.  He recently underwent cervical surgery for pain.  He had a fusion of the lower cervical vertebrae by Dr. Trenton Gammon and is doing much better.  He did have postoperative swelling and had steroids because of difficulty breathing.  Lipids a year ago were well controlled.  He has an upcoming appoint with Dr. Manuella Ghazi who will check his cholesterol in about 2 months.  I asked him to send [place] those records.  He denies any chest pain or shortness of breath.  Blood pressure is well controlled.  He did recently retire.  03/23/2019  Charles Oconnor is seen today for follow-up.  He reports some progressively worsening shortness of breath with exertion.  He said this has been going on for  several months however recently had an intestinal obstruction.  He said he was up in [place] and started having symptoms and then came here and went to [facility].  A CT scan indicated small bowel obstruction.  He has a history of prior surgeries including a robotic prostatectomy and appendectomy and therefore had surgical adhesions.  He is improving from that but does report that he has had some worsening symptoms.  He is concerned about coronary disease.  I performed his last heart catheterization in 2016 which showed some mild to moderate nonobstructive coronary disease.  His arteries actually looked somewhat better than it had previously.  His most recent lipid profile showed total cholesterol 153, triglycerides 179, HDL 39 and LDL 78.  05/10/2019  Charles Oconnor returns today for follow-up of his CT coronary angiogram.  He reports persistent shortness of breath with exertion.  His CT coronary angiogram was markedly abnormal demonstrating a high coronary calcium score 1119 which was 88th percentile for age and sex matched control.  There is heavy calcification of the right coronary and possible severe stenosis.  The mid LAD was also calcified and it was felt he had at least CAD RADS 3 disease.  CT FFR was sent and however did not demonstrate any significant stenosis.  Although it is possible for a false negative FFR, it is fairly unlikely.  That being said he has extensive coronary disease and will need additional aggressive risk factor modification.  He is currently on pravastatin 80 mg and LDL remains above target at 78.   PMHx:  Past Medical History:  Diagnosis Date  . Cervical spinal stenosis   . Complication of anesthesia   . Coronary artery disease    mild to moderate  . Dyslipidemia   . ED (erectile dysfunction)   . Hypertension   . PONV (postoperative nausea and vomiting)    Happened once 30 yrs ago. No problems since.  . Prostate cancer [facility])     Past Surgical History:    Procedure Laterality Date  . ANTERIOR CERVICAL DECOMPRESSION/DISCECTOMY FUSION 4 LEVELS N/A 09/22/2017   Procedure: ACDF - C3-C4 - C4-C5 - C5-C6 - C6-C7;  Surgeon: Earnie Larsson, MD;  Location: [facility];  Service: Neurosurgery;  Laterality: N/A;  . APPENDECTOMY  1985  . CARDIAC CATHETERIZATION  2002, 2011   mild-mod CAD  . CARDIAC CATHETERIZATION N/A 03/12/2015   Procedure: Left Heart Cath and Coronary Angiography;  Surgeon: Pixie Casino, MD;  Location: [facility];  Service: Cardiovascular;  Laterality: N/A;  . COLON RESECTION N/A 02/22/2019   Procedure: LAPAROSCOPIC COLON RESECTION AND DIVERTICULECTOMY;  Surgeon: Kieth Brightly, Arta Bruce, MD;  Location: [facility];  Service: General;  Laterality: N/A;  . HAND SURGERY Left    had joints fused  . LAPAROSCOPIC LYSIS OF ADHESIONS N/A 02/22/2019   Procedure: LAPAROSCOPIC LYSIS OF ADHESIONS;  Surgeon: Kieth Brightly Arta Bruce, MD;  Location: [facility];  Service: General;  Laterality: N/A;  . PROSTATECTOMY  03/2007  . TOTAL KNEE ARTHROPLASTY  04/2009    FAMHx:  Family History  Problem Relation Age of Onset  . Heart attack Father   . Diabetes Mother   . Heart failure Mother   . Coronary artery disease Mother   . Stroke Mother   . Prostate cancer Brother   . Heart attack Brother        also CVA  . Heart attack Brother        also CVA  . Cancer Brother     SOCHx:   reports that he has never smoked. He has never used smokeless tobacco. He reports that he does not drink alcohol or use drugs.  ALLERGIES:  Allergies  Allergen Reactions  . Zocor [Simvastatin] Nausea Only and Other (See Comments)    Elevate liver enzymes    ROS: Pertinent items noted in HPI and remainder of comprehensive ROS otherwise negative.  HOME MEDS: Current Outpatient Medications  Medication Sig Dispense Refill  . amLODipine (NORVASC) 10 MG tablet Take 10 mg by mouth daily.    Marland Kitchen aspirin EC 81 MG tablet Take 81 mg by mouth daily.    . influenza vaccine adjuvanted  (FLUAD QUADRIVALENT) 0.5 ML injection Inject 1 each into the skin once. 01/11/19    . lisinopril-hydrochlorothiazide (PRINZIDE,ZESTORETIC) 20-12.5 MG per tablet Take 2 tablets by mouth daily.     . Multiple Vitamin (MULTIVITAMIN WITH MINERALS) TABS tablet Take 1 tablet by mouth daily.    . pantoprazole (PROTONIX) 40 MG tablet Take 40 mg by mouth every evening.     . pravastatin (PRAVACHOL) 80 MG tablet Take 80 mg by mouth at bedtime.      No current facility-administered medications for this visit.    LABS/IMAGING: No results found for this or any previous visit (from  the past 48 hour(s)). No results found.  VITALS: BP (!) 142/76   Pulse 71   Temp (!) 97.2 F (36.2 C)   Ht 6\' 1"  (1.854 m)   Wt 201 lb 3.2 oz (91.3 kg)   SpO2 98%   BMI 26.55 kg/m   EXAM: Deferred  EKG: Deferred  ASSESSMENT: 1. Progressive dyspnea on exertion/chest pressure -high CAC score of 1119, moderate to severe nonobstructive coronary disease and negative by FFR (04/2019) 2. Mild nonobstructive coronary disease by cath (2016), somewhat improved compared to his prior study in 2011 3. Hypertension-controlled 4. Dyslipidemia - controlled 5. Acceptable risk for planned total knee replacement  PLAN: 1.   Charles Oconnor has significant coronary artery disease with multivessel calcification and moderate to severe stenosis however had a negative FFR.  It is still possible symptoms could be coronary ischemia with an anginal equivalent of dyspnea but has had no chest pain.  Also possible he could have pulmonary or other etiologies of his dyspnea.  I would defer this work-up to his PCP.  I would recommend intensification of his statin we will switch him to rosuvastatin 40 mg daily and repeat lipids in about 3 months.  If he does develop progressive dyspnea or anginal symptoms, I would have a low threshold for cardiac catheterization. Finally, he is planning on a left total knee replacement with Dr. Maureen Ralphs.  From a cardiac  standpoint he should be at acceptable risk for surgery..  Follow-up in 3 months.  Pixie Casino, MD, [facility], [place] Director of the [facility] Lipid Disorders &  Cardiovascular Risk Reduction Clinic Diplomate of the American Board of Clinical Lipidology Attending Cardiologist  Direct Dial: [phone]  Fax: [phone]  Website:  [email or web address] 05/10/2019, 10:27 AM

## 2019-06-11 DIAGNOSIS — Z23 Encounter for immunization: Secondary | ICD-10-CM | POA: Diagnosis not present

## 2019-06-17 ENCOUNTER — Other Ambulatory Visit (HOSPITAL_COMMUNITY): Payer: Self-pay | Admitting: Respiratory Therapy

## 2019-06-17 DIAGNOSIS — R0609 Other forms of dyspnea: Secondary | ICD-10-CM

## 2019-07-02 DIAGNOSIS — Z23 Encounter for immunization: Secondary | ICD-10-CM | POA: Diagnosis not present

## 2019-07-12 ENCOUNTER — Other Ambulatory Visit (HOSPITAL_COMMUNITY)
Admission: RE | Admit: 2019-07-12 | Discharge: 2019-07-12 | Disposition: A | Discharge status: Home / Self Care | Payer: Medicare Other | Source: Ambulatory Visit | Attending: Family Medicine | Admitting: Family Medicine

## 2019-07-12 DIAGNOSIS — Z20822 Contact with and (suspected) exposure to covid-19: Secondary | ICD-10-CM | POA: Diagnosis not present

## 2019-07-12 DIAGNOSIS — Z01812 Encounter for preprocedural laboratory examination: Secondary | ICD-10-CM | POA: Insufficient documentation

## 2019-07-12 LAB — SARS CORONAVIRUS 2 (TAT 6-24 HRS): SARS Coronavirus 2: NEGATIVE

## 2019-07-14 ENCOUNTER — Ambulatory Visit (HOSPITAL_COMMUNITY)
Admission: RE | Admit: 2019-07-14 | Discharge: 2019-07-14 | Disposition: A | Discharge status: Home / Self Care | Payer: Medicare Other | Source: Ambulatory Visit | Attending: Family Medicine | Admitting: Family Medicine

## 2019-07-14 ENCOUNTER — Other Ambulatory Visit: Payer: Self-pay

## 2019-07-14 DIAGNOSIS — R0609 Other forms of dyspnea: Secondary | ICD-10-CM | POA: Insufficient documentation

## 2019-07-14 LAB — PULMONARY FUNCTION TEST
DL/VA % pred: 105 %
DL/VA: 4.23 ml/min/mmHg/L
DLCO unc % pred: 74 %
DLCO unc: 21.23 ml/min/mmHg
FEF 25-75 Post: 2.68 L/sec
FEF 25-75 Pre: 2.81 L/sec
FEF2575-%Change-Post: -4 %
FEF2575-%Pred-Post: 97 %
FEF2575-%Pred-Pre: 101 %
FEV1-%Change-Post: 0 %
FEV1-%Pred-Post: 72 %
FEV1-%Pred-Pre: 73 %
FEV1-Post: 2.65 L
FEV1-Pre: 2.66 L
FEV1FVC-%Change-Post: 1 %
FEV1FVC-%Pred-Pre: 112 %
FEV6-%Change-Post: 0 %
FEV6-%Pred-Post: 66 %
FEV6-%Pred-Pre: 67 %
FEV6-Post: 3.14 L
FEV6-Pre: 3.16 L
FEV6FVC-%Change-Post: 0 %
FEV6FVC-%Pred-Post: 104 %
FEV6FVC-%Pred-Pre: 103 %
FVC-%Change-Post: -1 %
FVC-%Pred-Post: 64 %
FVC-%Pred-Pre: 65 %
FVC-Post: 3.17 L
FVC-Pre: 3.23 L
Post FEV1/FVC ratio: 84 %
Post FEV6/FVC ratio: 99 %
Pre FEV1/FVC ratio: 83 %
Pre FEV6/FVC Ratio: 98 %
RV % pred: 111 %
RV: 2.9 L
TLC % pred: 80 %
TLC: 6.16 L

## 2019-07-14 MED ORDER — ALBUTEROL SULFATE (2.5 MG/3ML) 0.083% IN NEBU
2.5000 mg | INHALATION_SOLUTION | Freq: Once | RESPIRATORY_TRACT | Status: AC
  Administered 2019-07-14: 2.5 mg via RESPIRATORY_TRACT

## 2019-08-03 DIAGNOSIS — E785 Hyperlipidemia, unspecified: Secondary | ICD-10-CM | POA: Diagnosis not present

## 2019-08-03 LAB — LIPID PANEL
Chol/HDL Ratio: 3.1 ratio (ref 0.0–5.0)
Cholesterol, Total: 125 mg/dL (ref 100–199)
HDL: 40 mg/dL (ref >39)
LDL Chol Calc (NIH): 61 mg/dL (ref 0–99)
Triglycerides: 137 mg/dL (ref 0–149)
VLDL Cholesterol Cal: 24 mg/dL (ref 5–40)

## 2019-08-09 DIAGNOSIS — N393 Stress incontinence (female) (male): Secondary | ICD-10-CM | POA: Diagnosis not present

## 2019-08-09 DIAGNOSIS — R351 Nocturia: Secondary | ICD-10-CM | POA: Diagnosis not present

## 2019-08-09 DIAGNOSIS — C61 Malignant neoplasm of prostate: Secondary | ICD-10-CM | POA: Diagnosis not present

## 2019-08-09 DIAGNOSIS — R3915 Urgency of urination: Secondary | ICD-10-CM | POA: Diagnosis not present

## 2019-08-10 ENCOUNTER — Ambulatory Visit (INDEPENDENT_AMBULATORY_CARE_PROVIDER_SITE_OTHER): Payer: Medicare Other | Admitting: Internal Medicine

## 2019-08-10 ENCOUNTER — Other Ambulatory Visit: Payer: Self-pay

## 2019-08-10 ENCOUNTER — Encounter: Payer: Self-pay | Admitting: Internal Medicine

## 2019-08-10 VITALS — BP 132/84 | HR 61 | Temp 97.3°F | Ht 73.0 in | Wt 201.6 lb

## 2019-08-10 DIAGNOSIS — I2583 Coronary atherosclerosis due to lipid rich plaque: Secondary | ICD-10-CM

## 2019-08-10 DIAGNOSIS — E785 Hyperlipidemia, unspecified: Secondary | ICD-10-CM | POA: Diagnosis not present

## 2019-08-10 DIAGNOSIS — R0602 Shortness of breath: Secondary | ICD-10-CM | POA: Diagnosis not present

## 2019-08-10 DIAGNOSIS — I10 Essential (primary) hypertension: Secondary | ICD-10-CM

## 2019-08-10 DIAGNOSIS — I2510 Atherosclerotic heart disease of native coronary artery without angina pectoris: Secondary | ICD-10-CM

## 2019-08-10 MED ORDER — ISOSORBIDE MONONITRATE ER 30 MG PO TB24: 30.0000 mg | ORAL_TABLET | Freq: Every day | ORAL | 3 refills | Status: DC

## 2019-08-10 NOTE — Patient Instructions (Signed)
Medication Instructions:  START isosorbide mononitrate 30mg  daily Take a 2 week "statin holiday" - please call or send MyChart message with an update on your symptoms after this time period   *If you need a refill on your cardiac medications before your next appointment, please call your pharmacy*   Lab Work: NONE If you have labs (blood work) drawn today and your tests are completely normal, you will receive your results only by: Marland Kitchen MyChart Message (if you have MyChart) OR . A paper copy in the mail If you have any lab test that is abnormal or we need to change your treatment, we will call you to review the results.   Testing/Procedures: NONE   Follow-Up: At [facility], you and your health needs are our priority.  As part of our continuing mission to provide you with exceptional heart care, we have created designated Provider Care Teams.  These Care Teams include your primary Cardiologist (physician) and Advanced Practice Providers (APPs -  Physician Assistants and Nurse Practitioners) who all work together to provide you with the care you need, when you need it.  We recommend signing up for the patient portal called "MyChart".  Sign up information is provided on this After Visit Summary.  MyChart is used to connect with patients for Virtual Visits (Telemedicine).  Patients are able to view lab/test results, encounter notes, upcoming appointments, etc.  Non-urgent messages can be sent to your provider as well.   To learn more about what you can do with MyChart, go to [email or web address].    Your next appointment:   6 week(s)  The format for your next appointment:   In Person  Provider:   You may see Dr. Debara Pickett or one of the following Advanced Practice Providers on your designated Care Team:    [name], PA-C  Fabian Sharp, [place] or   Roby Lofts, [place]    Other Instructions

## 2019-08-10 NOTE — Progress Notes (Signed)
OFFICE NOTE  Chief Complaint:  Follow-up  Primary Care Physician: Mayra Neer, MD  HPI:  Charles Oconnor  is a 69 year old gentleman with history of hypertension, dyslipidemia and mild to moderate coronary disease. We have been following him for dyslipidemia and he has had liver enzyme abnormalities before on Zocor and was changed to Pravachol with pretty good control. Sometimes he gets chest discomfort; however, it is pretty short lived and possibly related to that. His EKG today shows a sinus bradycardia at 54 and I understand recently he was placed on a beta blocker which caused marked sinus bradycardia and that was promptly discontinued. At this point, although there is possibly an occasion for that given his history of coronary disease, his heart rate being low, he is unable to tolerate a beta blocker. He is on an ACE inhibitor which is pretty good at controlling his blood pressure as well as amlodipine.   Charles Oconnor returns today for followup. He reports doing fairly well. He's recently started doing some walking and has actually lost about 10 pounds in the last 6 months. He says he does feel better and have more energy. A portion is developed some peripheral neuropathy. He's been started on Neurontin by a neurologist in [place]. He seems to be tolerating this medicine although has some occasional lightheadedness and dizziness. He is peripheral nerve pain is better however does have numbness in his feet. He occasionally gets some mild swelling. He also was told that he had high vitamin B6 levels and was told to stop taking his multivitamin.  I the pleasure see Charles Oconnor back in the office today. Overall he is doing fairly well except he is struggling with low back pain and some neuropathic pain in his legs. He's been getting back injections without much improvement. He's also been describing some occasional chest discomfort and progressive fatigue over the past several months.  He feels like he has no energy. There is also complaints of mid back pain between his shoulder blades which occurs on unusual occasions and not associated with his low back pain. Sometimes it's a little worse when exerting himself although he's not been as active due to his back problems. As a reminder he had heart catheterization in 2011 with Dr. Tina Griffiths, which demonstrated mild to moderate coronary artery disease.  He recently had lab work through his primary care provider which looks fairly normal except for an elevated cholesterol profile. His total cholesterol is 166, HL 43, triglycerides 112 and LDL 101. He's currently on pravastatin 80 mg daily and says that he has had problems with myalgias and liver function abnormalities on simvastatin in the past.  Charles Oconnor returns today for follow-up. He recently underwent a stress test which showed the following:   The left ventricular ejection fraction is mildly decreased (45-54%).  Nuclear stress EF: 52%.  No T wave inversion was noted during stress.  There was no ST segment deviation noted during stress.  Defect 1: There is a medium defect of moderate severity.  There is a moderate-sized and intensity, partially reversible inferoseptal perfusion defect which could represent ischemia. LVEF 52% with basal inferoseptal dyskinesis. This is an intermediate risk study.   Based on these findings, I recommended either continued medical therapy or we could consider cardiac catheterization. Subsequently he felt like his chest pain was worsening and ultimately based on his prior catheterization results, we decided to proceed with cardiac catheterization. The results are as follows:   Prox RCA lesion,  40% stenosed. The lesion was not previously treated.  Prox Cx to Dist Cx lesion, 20% stenosed. The lesion was not previously treated.  Ost 1st Diag to 1st Diag lesion, 40% stenosed. The lesion was not previously treated.  Dist LAD lesion, 20%  stenosed. The lesion was not previously treated.  The left ventricular systolic function is normal.  Mild, non-obstructive CAD - somewhat improved from prior cath in 2011. Normal LV function. False positive nuclear stress test. At this point, Charles Oconnor is doing much better and denies any chest pain or worsening shortness of breath. He gets a small amount of ankle edema which I attribute to amlodipine.  04/01/2016  Charles Oconnor returns today for follow-up. He denies any chest pain or worsening shortness of breath. He apparently had a sore throat yesterday and has some drainage today. He is interested in some over-the-counter medications for that. He's had no chest pain that sounds anginal. Blood pressure well controlled. Recent cholesterol check through his primary care provider shows good control with LDL less than 100.  03/13/2017  Charles Oconnor returns today for follow-up.  Overall he is doing exceedingly well.  Blood pressure is well-controlled today 126/70.  His body mass index is near normal.  Cholesterol is due to be assessed but has been well controlled.  EKG shows sinus bradycardia at 52 without ischemic changes.  He denies any chest pain or worsening shortness of breath.  03/19/2018  Charles Oconnor is seen today in follow-up.  He recently underwent cervical surgery for pain.  He had a fusion of the lower cervical vertebrae by Dr. Trenton Gammon and is doing much better.  He did have postoperative swelling and had steroids because of difficulty breathing.  Lipids a year ago were well controlled.  He has an upcoming appoint with Dr. Manuella Ghazi who will check his cholesterol in about 2 months.  I asked him to send [place] those records.  He denies any chest pain or shortness of breath.  Blood pressure is well controlled.  He did recently retire.  03/23/2019  Charles Oconnor is seen today for follow-up.  He reports some progressively worsening shortness of breath with exertion.  He said this has been going on for  several months however recently had an intestinal obstruction.  He said he was up in [place] and started having symptoms and then came here and went to [facility].  A CT scan indicated small bowel obstruction.  He has a history of prior surgeries including a robotic prostatectomy and appendectomy and therefore had surgical adhesions.  He is improving from that but does report that he has had some worsening symptoms.  He is concerned about coronary disease.  I performed his last heart catheterization in 2016 which showed some mild to moderate nonobstructive coronary disease.  His arteries actually looked somewhat better than it had previously.  His most recent lipid profile showed total cholesterol 153, triglycerides 179, HDL 39 and LDL 78.  05/10/2019  Charles Oconnor returns today for follow-up of his CT coronary angiogram.  He reports persistent shortness of breath with exertion.  His CT coronary angiogram was markedly abnormal demonstrating a high coronary calcium score 1119 which was 88th percentile for age and sex matched control.  There is heavy calcification of the right coronary and possible severe stenosis.  The mid LAD was also calcified and it was felt he had at least CAD RADS 3 disease.  CT FFR was sent and however did not demonstrate any significant stenosis.  Although it is possible for a false negative FFR, it is fairly unlikely.  That being said he has extensive coronary disease and will need additional aggressive risk factor modification.  He is currently on pravastatin 80 mg and LDL remains above target at 78.   08/10/2019  Charles Oconnor is seen today in follow-up.  He still endorses shortness of breath.  He also has some anxiety.  This seems to be worsening he thinks.  He had recent pulmonary function testing which does show some mild reduction in FEV1 and FVC with some improvement after bronchodilator.  He has follow-up with Dr. Vaughan Browner in pulmonary later this month.  I wonder however if  his progressive dyspnea is his multivessel disease.  It could very well be is ischemic equivalent.  After switching him to rosuvastatin, he has had marked improvement in his lipids.  Total cholesterol now 125, triglycerides 137, HDL 40 and LDL 61.  Unfortunately, he reports intolerance of the statin causing what he feels is myalgias and some joint pain.  PMHx:  Past Medical History:  Diagnosis Date  . Cervical spinal stenosis   . Complication of anesthesia   . Coronary artery disease    mild to moderate  . Dyslipidemia   . ED (erectile dysfunction)   . Hypertension   . PONV (postoperative nausea and vomiting)    Happened once 30 yrs ago. No problems since.  . Prostate cancer [facility])     Past Surgical History:  Procedure Laterality Date  . ANTERIOR CERVICAL DECOMPRESSION/DISCECTOMY FUSION 4 LEVELS N/A 09/22/2017   Procedure: ACDF - C3-C4 - C4-C5 - C5-C6 - C6-C7;  Surgeon: Earnie Larsson, MD;  Location: [place];  Service: Neurosurgery;  Laterality: N/A;  . APPENDECTOMY  1985  . CARDIAC CATHETERIZATION  2002, 2011   mild-mod CAD  . CARDIAC CATHETERIZATION N/A 03/12/2015   Procedure: Left Heart Cath and Coronary Angiography;  Surgeon: Pixie Casino, MD;  Location: [place] CV LAB;  Service: Cardiovascular;  Laterality: N/A;  . COLON RESECTION N/A 02/22/2019   Procedure: LAPAROSCOPIC COLON RESECTION AND DIVERTICULECTOMY;  Surgeon: Kieth Brightly, Arta Bruce, MD;  [facility];  Service: General;  Laterality: N/A;  . HAND SURGERY Left    had joints fused  . LAPAROSCOPIC LYSIS OF ADHESIONS N/A 02/22/2019   Procedure: LAPAROSCOPIC LYSIS OF ADHESIONS;  Surgeon: Kieth Brightly Arta Bruce, MD;  [facility];  Service: General;  Laterality: N/A;  . PROSTATECTOMY  03/2007  . TOTAL KNEE ARTHROPLASTY  04/2009    FAMHx:  Family History  Problem Relation Age of Onset  . Heart attack Father   . Diabetes Mother   . Heart failure Mother   . Coronary artery disease Mother   . Stroke Mother   .  Prostate cancer Brother   . Heart attack Brother        also CVA  . Heart attack Brother        also CVA  . Cancer Brother     SOCHx:   reports that he has never smoked. He has never used smokeless tobacco. He reports that he does not drink alcohol or use drugs.  ALLERGIES:  Allergies  Allergen Reactions  . Zocor [Simvastatin] Nausea Only and Other (See Comments)    Elevate liver enzymes    ROS: Pertinent items noted in HPI and remainder of comprehensive ROS otherwise negative.  HOME MEDS: Current Outpatient Medications  Medication Sig Dispense Refill  . amLODipine (NORVASC) 10 MG tablet Take 10 mg by mouth  daily.    . aspirin EC 81 MG tablet Take 81 mg by mouth daily.    Marland Kitchen lisinopril-hydrochlorothiazide (PRINZIDE,ZESTORETIC) 20-12.5 MG per tablet Take 2 tablets by mouth daily.     . Multiple Vitamin (MULTIVITAMIN WITH MINERALS) TABS tablet Take 1 tablet by mouth daily.    . pantoprazole (PROTONIX) 40 MG tablet Take 40 mg by mouth every evening.     . rosuvastatin (CRESTOR) 40 MG tablet Take 1 tablet (40 mg total) by mouth daily. 90 tablet 3   No current facility-administered medications for this visit.    LABS/IMAGING: No results found for this or any previous visit (from the past 48 hour(s)). No results found.  VITALS: BP 132/84   Pulse 61   Temp (!) 97.3 F (36.3 C)   Ht 6\' 1"  (1.854 m)   Wt 201 lb 9.6 oz (91.4 kg)   SpO2 95%   BMI 26.60 kg/m   EXAM: Deferred  EKG: Sinus bradycardia without ischemia at 55-personally reviewed  ASSESSMENT: 1. Progressive dyspnea on exertion/chest pressure -high CAC score of 1119, moderate to severe nonobstructive coronary disease and negative by FFR (04/2019) 2. Mild nonobstructive coronary disease by cath (2016), somewhat improved compared to his prior study in 2011 3. Mild to moderate abnormalities in pulmonary function 4. Hypertension-controlled 5. Dyslipidemia - controlled 6. Acceptable risk for planned total knee  replacement  PLAN: 1.   Charles Oconnor has had progressive dyspnea on exertion and has multivessel coronary disease but felt to be nonobstructive however he has a very high calcium score.  He flatly denies any angina.  He does report his dyspnea has been worsening though and this is concerning for progressive disease.  He has a pulmonary follow-up to review his PFTs later this month and may or may not be treated, however to me his symptoms seem to be out of proportion to his PFTs.  I would like to start isosorbide 30 mg daily to see if there is an improvement in his dyspnea and if this is his anginal equivalent.  If he responds then I would likely consider repeat heart catheterization.  Also, we will hold his rosuvastatin for 2 weeks to see if his symptoms improve.  Ultimately if this is the case, we may consider switching him to PCSK9 inhibitor.  Follow-up in 4 to 6 weeks.  Pixie Casino, MD, [facility], [place] Director of the Advanced Lipid Disorders &  Cardiovascular Risk Reduction Clinic Diplomate of the American Board of Clinical Lipidology Attending Cardiologist  Direct Dial: [phone]  Fax: [phone]  Website:  [email or web address] [email or web address] Tomorrow Dehaas 08/10/2019, 9:29 AM

## 2019-08-29 MED ORDER — REPATHA SURECLICK 140 MG/ML ~~LOC~~ SOAJ: 1.0000 | SUBCUTANEOUS | 11 refills | Status: DC

## 2019-08-29 NOTE — Addendum Note (Signed)
Addended by: Fidel Levy on: 08/29/2019 08:47 AM   Modules accepted: Orders

## 2019-09-01 ENCOUNTER — Other Ambulatory Visit: Payer: Self-pay

## 2019-09-01 ENCOUNTER — Ambulatory Visit (INDEPENDENT_AMBULATORY_CARE_PROVIDER_SITE_OTHER): Payer: Medicare Other | Admitting: Pulmonary Disease

## 2019-09-01 ENCOUNTER — Encounter: Payer: Self-pay | Admitting: Pulmonary Disease

## 2019-09-01 VITALS — BP 142/68 | HR 74 | Ht 73.0 in | Wt 204.0 lb

## 2019-09-01 DIAGNOSIS — I2583 Coronary atherosclerosis due to lipid rich plaque: Secondary | ICD-10-CM

## 2019-09-01 DIAGNOSIS — I2510 Atherosclerotic heart disease of native coronary artery without angina pectoris: Secondary | ICD-10-CM | POA: Diagnosis not present

## 2019-09-01 DIAGNOSIS — R0600 Dyspnea, unspecified: Secondary | ICD-10-CM | POA: Diagnosis not present

## 2019-09-01 NOTE — Patient Instructions (Addendum)
I have reviewed your lung function test which shows very minimal changes.  I am not sure if this entirely explains your symptoms We will get a high-res CT for further evaluation If normal then we need to proceed with heart catheterization  Follow-up in 1 month after CT chest high-res CT

## 2019-09-01 NOTE — Progress Notes (Signed)
Charles Oconnor    [id number]    [birth year]  Primary Care Physician:Shaw, Nathen May, MD  Referring Physician: Mayra Neer, MD [address] [employer] [address] [place],  [place] [id number]  Chief complaint: Consult for dyspnea  HPI: 69 year old with history of hypertension, hyperlipidemia, prostate cancer Complains of increasing dyspnea on exertion since December 2020.  No symptoms at rest.  No cough, wheezing, sputum production. He had PFTs which showed very minimal diffusion impairment.  Dr. Debara Pickett his cardiologist is considering heart catheterization as there is no evidence of pulmonary impairment  Prostate cancer diagnosed in 2008 and treated with prostatectomy.  His PSAs have been normal.  Pets: No pets Occupation: Retired Airline pilot. Exposures: No known exposures.  No mold, hot tub, Jacuzzi.  No down pillows or comforters Smoking history: Never smoker Travel history: No significant travel history Relevant family history: No significant family history of lung disease  Outpatient Encounter Medications as of 09/01/2019  Medication Sig  . amLODipine (NORVASC) 10 MG tablet Take 10 mg by mouth daily.  Marland Kitchen aspirin EC 81 MG tablet Take 81 mg by mouth daily.  . isosorbide mononitrate (IMDUR) 30 MG 24 hr tablet Take 1 tablet (30 mg total) by mouth daily.  Marland Kitchen lisinopril-hydrochlorothiazide (PRINZIDE,ZESTORETIC) 20-12.5 MG per tablet Take 2 tablets by mouth daily.   . Multiple Vitamin (MULTIVITAMIN WITH MINERALS) TABS tablet Take 1 tablet by mouth daily.  . pantoprazole (PROTONIX) 40 MG tablet Take 40 mg by mouth every evening.   . Evolocumab (REPATHA SURECLICK) [id number] MG/ML SOAJ Inject 1 Dose into the skin every 14 (fourteen) days.  . rosuvastatin (CRESTOR) 40 MG tablet Take 1 tablet (40 mg total) by mouth daily. (Patient not taking: Reported on 08/10/2019)   No facility-administered encounter medications on file as of 09/01/2019.    Allergies as of 09/01/2019 - Review Complete  09/01/2019  Allergen Reaction Noted  . Zocor [simvastatin] Nausea Only and Other (See Comments) 01/12/2013  . Crestor [rosuvastatin calcium] Other (See Comments) 08/26/2019    Past Medical History:  Diagnosis Date  . Cervical spinal stenosis   . Complication of anesthesia   . Coronary artery disease    mild to moderate  . Dyslipidemia   . ED (erectile dysfunction)   . Hypertension   . PONV (postoperative nausea and vomiting)    Happened once 30 yrs ago. No problems since.  . Prostate cancer [facility])     Past Surgical History:  Procedure Laterality Date  . ANTERIOR CERVICAL DECOMPRESSION/DISCECTOMY FUSION 4 LEVELS N/A 09/22/2017   Procedure: ACDF - C3-C4 - C4-C5 - C5-C6 - C6-C7;  Surgeon: Earnie Larsson, MD;  Location: [place];  Service: Neurosurgery;  Laterality: N/A;  . APPENDECTOMY  1985  . CARDIAC CATHETERIZATION  2002, 2011   mild-mod CAD  . CARDIAC CATHETERIZATION N/A 03/12/2015   Procedure: Left Heart Cath and Coronary Angiography;  Surgeon: Pixie Casino, MD;  Location: [place] CV LAB;  Service: Cardiovascular;  Laterality: N/A;  . COLON RESECTION N/A 02/22/2019   Procedure: LAPAROSCOPIC COLON RESECTION AND DIVERTICULECTOMY;  Surgeon: Kieth Brightly, Arta Bruce, MD;  [facility];  Service: General;  Laterality: N/A;  . HAND SURGERY Left    had joints fused  . LAPAROSCOPIC LYSIS OF ADHESIONS N/A 02/22/2019   Procedure: LAPAROSCOPIC LYSIS OF ADHESIONS;  Surgeon: Kieth Brightly Arta Bruce, MD;  [facility];  Service: General;  Laterality: N/A;  . PROSTATECTOMY  03/2007  . TOTAL KNEE ARTHROPLASTY  04/2009  Family History  Problem Relation Age of Onset  . Heart attack Father   . Diabetes Mother   . Heart failure Mother   . Coronary artery disease Mother   . Stroke Mother   . Prostate cancer Brother   . Heart attack Brother        also CVA  . Heart attack Brother        also CVA  . Cancer Brother     Social History   Socioeconomic History  . Marital status:  Married    Spouse name: Not on file  . Number of children: 1  . Years of education: Not on file  . Highest education level: Not on file  Occupational History  . Occupation: retired Chemical engineer: UNEMPLOYED  Tobacco Use  . Smoking status: Never Smoker  . Smokeless tobacco: Never Used  Substance and Sexual Activity  . Alcohol use: No  . Drug use: No  . Sexual activity: Not on file  Other Topics Concern  . Not on file  Social History Narrative  . Not on file   Social Determinants of Health   Financial Resource Strain:   . Difficulty of Paying Living Expenses:   Food Insecurity:   . Worried About Charity fundraiser in the Last Year:   . Arboriculturist in the Last Year:   Transportation Needs:   . Film/video editor (Medical):   Marland Kitchen Lack of Transportation (Non-Medical):   Physical Activity:   . Days of Exercise per Week:   . Minutes of Exercise per Session:   Stress:   . Feeling of Stress :   Social Connections:   . Frequency of Communication with Friends and Family:   . Frequency of Social Gatherings with Friends and Family:   . Attends Religious Services:   . Active Member of Clubs or Organizations:   . Attends Archivist Meetings:   Marland Kitchen Marital Status:   Intimate Partner Violence:   . Fear of Current or Ex-Partner:   . Emotionally Abused:   Marland Kitchen Physically Abused:   . Sexually Abused:     Review of systems: Review of Systems  Constitutional: Negative for fever and chills.  HENT: Negative.   Eyes: Negative for blurred vision.  Respiratory: as per HPI  Cardiovascular: Negative for chest pain and palpitations.  Gastrointestinal: Negative for vomiting, diarrhea, blood per rectum. Genitourinary: Negative for dysuria, urgency, frequency and hematuria.  Musculoskeletal: Negative for myalgias, back pain and joint pain.  Skin: Negative for itching and rash.  Neurological: Negative for dizziness, tremors, focal weakness, seizures and loss of  consciousness.  Endo/Heme/Allergies: Negative for environmental allergies.  Psychiatric/Behavioral: Negative for depression, suicidal ideas and hallucinations.  All other systems reviewed and are negative.  Physical Exam: Blood pressure (!) 142/68, pulse 74, height 6\' 1"  (1.854 m), weight 204 lb (92.5 kg), SpO2 98 %. Gen:      No acute distress HEENT:  EOMI, sclera anicteric Neck:     No masses; no thyromegaly Lungs:    Clear to auscultation bilaterally; normal respiratory effort CV:         Regular rate and rhythm; no murmurs Abd:      + bowel sounds; soft, non-tender; no palpable masses, no distension Ext:    No edema; adequate peripheral perfusion Skin:      Warm and dry; no rash Neuro: alert and oriented x 3 Psych: normal mood and affect  Data Reviewed: Imaging: CT coronaries  04/21/2019-visualized lungs show linear scarring the right middle lobe and right lower lobe.  Elevated right hemidiaphragm. I have reviewed the images personally.  PFTs: 07/14/2019 FVC 3.17 [64%], FEV1 2.65 [72%], F/F 84, TLC 6.16 [80%], DLCO 21.23 [74%,], DLCO/VA 105% Minimal diffusion defect which corrects for alveolar volume.  Cardiac: Echocardiogram 03/31/2019-LVEF 55-60%, mildly elevated RVSP-35  Assessment:  Consult for dyspnea Not sure if this is a pulmonary issue.  His PFTs show very minimal diffusion impairment which corrects for alveolar volume. His previous imaging including CT abdomen pelvis and cardiac CT shows some linear scarring in the right lower lobes associated with right elevated hemidiaphragm.  There is no clear evidence of interstitial lung disease on the studies.  Echo noted with mild pulmonary hypertension which can present with isolated reduction in diffusion capacity.  Get a HRCT for better evaluation of the lungs.  If there is no abnormalities then I agree with proceeding with heart catheterization.  Perhaps he can get a right and left heart cath at the same time.   Plan/Recommendations: High-resolution CT  Marshell Garfinkel MD [facility] 09/01/2019, 1:43 PM  CC: Mayra Neer, MD

## 2019-09-02 DIAGNOSIS — Z85828 Personal history of other malignant neoplasm of skin: Secondary | ICD-10-CM | POA: Diagnosis not present

## 2019-09-02 DIAGNOSIS — D225 Melanocytic nevi of trunk: Secondary | ICD-10-CM | POA: Diagnosis not present

## 2019-09-02 DIAGNOSIS — L820 Inflamed seborrheic keratosis: Secondary | ICD-10-CM | POA: Diagnosis not present

## 2019-09-02 DIAGNOSIS — L570 Actinic keratosis: Secondary | ICD-10-CM | POA: Diagnosis not present

## 2019-09-02 DIAGNOSIS — L739 Follicular disorder, unspecified: Secondary | ICD-10-CM | POA: Diagnosis not present

## 2019-09-02 DIAGNOSIS — D2261 Melanocytic nevi of right upper limb, including shoulder: Secondary | ICD-10-CM | POA: Diagnosis not present

## 2019-09-02 DIAGNOSIS — L814 Other melanin hyperpigmentation: Secondary | ICD-10-CM | POA: Diagnosis not present

## 2019-09-02 DIAGNOSIS — D1801 Hemangioma of skin and subcutaneous tissue: Secondary | ICD-10-CM | POA: Diagnosis not present

## 2019-09-02 DIAGNOSIS — D2272 Melanocytic nevi of left lower limb, including hip: Secondary | ICD-10-CM | POA: Diagnosis not present

## 2019-09-02 DIAGNOSIS — D2271 Melanocytic nevi of right lower limb, including hip: Secondary | ICD-10-CM | POA: Diagnosis not present

## 2019-09-02 DIAGNOSIS — L821 Other seborrheic keratosis: Secondary | ICD-10-CM | POA: Diagnosis not present

## 2019-09-02 DIAGNOSIS — D485 Neoplasm of uncertain behavior of skin: Secondary | ICD-10-CM | POA: Diagnosis not present

## 2019-09-06 DIAGNOSIS — M79672 Pain in left foot: Secondary | ICD-10-CM | POA: Diagnosis not present

## 2019-09-06 DIAGNOSIS — G5762 Lesion of plantar nerve, left lower limb: Secondary | ICD-10-CM | POA: Diagnosis not present

## 2019-09-15 ENCOUNTER — Ambulatory Visit: Payer: Medicare Other | Admitting: Podiatry

## 2019-09-19 ENCOUNTER — Ambulatory Visit
Admission: RE | Admit: 2019-09-19 | Discharge: 2019-09-19 | Disposition: A | Discharge status: Home / Self Care | Payer: Medicare Other | Source: Ambulatory Visit | Attending: Pulmonary Disease | Admitting: Pulmonary Disease

## 2019-09-19 ENCOUNTER — Other Ambulatory Visit: Payer: Self-pay

## 2019-09-19 DIAGNOSIS — R0600 Dyspnea, unspecified: Secondary | ICD-10-CM

## 2019-09-19 DIAGNOSIS — R0609 Other forms of dyspnea: Secondary | ICD-10-CM | POA: Diagnosis not present

## 2019-09-19 DIAGNOSIS — J9811 Atelectasis: Secondary | ICD-10-CM | POA: Diagnosis not present

## 2019-09-22 ENCOUNTER — Encounter: Payer: Self-pay | Admitting: Internal Medicine

## 2019-09-22 ENCOUNTER — Other Ambulatory Visit: Payer: Self-pay | Admitting: Internal Medicine

## 2019-09-22 ENCOUNTER — Ambulatory Visit (INDEPENDENT_AMBULATORY_CARE_PROVIDER_SITE_OTHER): Payer: Medicare Other | Admitting: Internal Medicine

## 2019-09-22 ENCOUNTER — Other Ambulatory Visit: Payer: Self-pay

## 2019-09-22 VITALS — BP 112/70 | HR 71 | Ht 73.0 in | Wt 201.2 lb

## 2019-09-22 DIAGNOSIS — Z01812 Encounter for preprocedural laboratory examination: Secondary | ICD-10-CM

## 2019-09-22 DIAGNOSIS — R0789 Other chest pain: Secondary | ICD-10-CM | POA: Diagnosis not present

## 2019-09-22 DIAGNOSIS — R0602 Shortness of breath: Secondary | ICD-10-CM

## 2019-09-22 DIAGNOSIS — R072 Precordial pain: Secondary | ICD-10-CM

## 2019-09-22 DIAGNOSIS — I2583 Coronary atherosclerosis due to lipid rich plaque: Secondary | ICD-10-CM | POA: Diagnosis not present

## 2019-09-22 DIAGNOSIS — I2510 Atherosclerotic heart disease of native coronary artery without angina pectoris: Secondary | ICD-10-CM

## 2019-09-22 DIAGNOSIS — Z79899 Other long term (current) drug therapy: Secondary | ICD-10-CM | POA: Diagnosis not present

## 2019-09-22 NOTE — Patient Instructions (Signed)
Medication Instructions:  NO CHANGES  *If you need a refill on your cardiac medications before your next appointment, please call your pharmacy*   Lab Work: BMET & CBC to be completed today If you have labs (blood work) drawn today and your tests are completely normal, you will receive your results only by: Marland Kitchen MyChart Message (if you have MyChart) OR . A paper copy in the mail If you have any lab test that is abnormal or we need to change your treatment, we will call you to review the results.  You will need to have the coronavirus test completed prior to your procedure. An appointment has been made at 10:45am on Saturday May 14. This is a Drive Up Visit at the [facility] [address]. Please tell them that you are there for pre-procedure testing. Someone will direct you to the appropriate testing line. Stay in your car and someone will be with you shortly. Please make sure to have all other labs completed before this test because you will need to stay quarantined until your procedure. Please take your insurance card to this test.    Testing/Procedures: Heart Catheterization @ [place]: At [facility], you and your health needs are our priority.  As part of our continuing mission to provide you with exceptional heart care, we have created designated Provider Care Teams.  These Care Teams include your primary Cardiologist (physician) and Advanced Practice Providers (APPs -  Physician Assistants and Nurse Practitioners) who all work together to provide you with the care you need, when you need it.  We recommend signing up for the patient portal called "MyChart".  Sign up information is provided on this After Visit Summary.  MyChart is used to connect with patients for Virtual Visits (Telemedicine).  Patients are able to view lab/test results, encounter notes, upcoming appointments, etc.  Non-urgent messages can be sent to your provider as well.   To learn  more about what you can do with MyChart, go to [email or web address].    Your next appointment:   3-4 week(s) - after heart cath  The format for your next appointment:   In Person  Provider:   You may see Pixie Casino, MD or one of the following Advanced Practice Providers on your designated Care Team:    [name], PA-C  Fabian Sharp, [place] or   Roby Lofts, PA-C    Other Instructions  [email or web address] -- disease funds -- hypercholesterolemia -- awards up to $2500/year for co-pay assistance

## 2019-09-22 NOTE — H&P (View-Only) (Signed)
OFFICE NOTE  Chief Complaint:  Follow-up chest pressure, dyspnea  Primary Care Physician: Mayra Neer, MD  HPI:  Charles Oconnor  is a 69 year old gentleman with history of hypertension, dyslipidemia and mild to moderate coronary disease. We have been following him for dyslipidemia and he has had liver enzyme abnormalities before on Zocor and was changed to Pravachol with pretty good control. Sometimes he gets chest discomfort; however, it is pretty short lived and possibly related to that. His EKG today shows a sinus bradycardia at 54 and I understand recently he was placed on a beta blocker which caused marked sinus bradycardia and that was promptly discontinued. At this point, although there is possibly an occasion for that given his history of coronary disease, his heart rate being low, he is unable to tolerate a beta blocker. He is on an ACE inhibitor which is pretty good at controlling his blood pressure as well as amlodipine.   Charles Oconnor returns today for followup. He reports doing fairly well. He's recently started doing some walking and has actually lost about 10 pounds in the last 6 months. He says he does feel better and have more energy. A portion is developed some peripheral neuropathy. He's been started on Neurontin by a neurologist in [place]. He seems to be tolerating this medicine although has some occasional lightheadedness and dizziness. He is peripheral nerve pain is better however does have numbness in his feet. He occasionally gets some mild swelling. He also was told that he had high vitamin B6 levels and was told to stop taking his multivitamin.  I the pleasure see Charles Oconnor back in the office today. Overall he is doing fairly well except he is struggling with low back pain and some neuropathic pain in his legs. He's been getting back injections without much improvement. He's also been describing some occasional chest discomfort and progressive fatigue over  the past several months. He feels like he has no energy. There is also complaints of mid back pain between his shoulder blades which occurs on unusual occasions and not associated with his low back pain. Sometimes it's a little worse when exerting himself although he's not been as active due to his back problems. As a reminder he had heart catheterization in 2011 with Dr. Tina Griffiths, which demonstrated mild to moderate coronary artery disease.  He recently had lab work through his primary care provider which looks fairly normal except for an elevated cholesterol profile. His total cholesterol is 166, HL 43, triglycerides 112 and LDL 101. He's currently on pravastatin 80 mg daily and says that he has had problems with myalgias and liver function abnormalities on simvastatin in the past.  Charles Oconnor returns today for follow-up. He recently underwent a stress test which showed the following:   The left ventricular ejection fraction is mildly decreased (45-54%).  Nuclear stress EF: 52%.  No T wave inversion was noted during stress.  There was no ST segment deviation noted during stress.  Defect 1: There is a medium defect of moderate severity.  There is a moderate-sized and intensity, partially reversible inferoseptal perfusion defect which could represent ischemia. LVEF 52% with basal inferoseptal dyskinesis. This is an intermediate risk study.   Based on these findings, I recommended either continued medical therapy or we could consider cardiac catheterization. Subsequently he felt like his chest pain was worsening and ultimately based on his prior catheterization results, we decided to proceed with cardiac catheterization. The results are as follows:  Prox RCA lesion, 40% stenosed. The lesion was not previously treated.  Prox Cx to Dist Cx lesion, 20% stenosed. The lesion was not previously treated.  Ost 1st Diag to 1st Diag lesion, 40% stenosed. The lesion was not previously  treated.  Dist LAD lesion, 20% stenosed. The lesion was not previously treated.  The left ventricular systolic function is normal.  Mild, non-obstructive CAD - somewhat improved from prior cath in 2011. Normal LV function. False positive nuclear stress test. At this point, Charles Oconnor is doing much better and denies any chest pain or worsening shortness of breath. He gets a small amount of ankle edema which I attribute to amlodipine.  04/01/2016  Charles Oconnor returns today for follow-up. He denies any chest pain or worsening shortness of breath. He apparently had a sore throat yesterday and has some drainage today. He is interested in some over-the-counter medications for that. He's had no chest pain that sounds anginal. Blood pressure well controlled. Recent cholesterol check through his primary care provider shows good control with LDL less than 100.  03/13/2017  Charles Oconnor returns today for follow-up.  Overall he is doing exceedingly well.  Blood pressure is well-controlled today 126/70.  His body mass index is near normal.  Cholesterol is due to be assessed but has been well controlled.  EKG shows sinus bradycardia at 52 without ischemic changes.  He denies any chest pain or worsening shortness of breath.  03/19/2018  Charles Oconnor is seen today in follow-up.  He recently underwent cervical surgery for pain.  He had a fusion of the lower cervical vertebrae by Dr. Trenton Gammon and is doing much better.  He did have postoperative swelling and had steroids because of difficulty breathing.  Lipids a year ago were well controlled.  He has an upcoming appoint with Dr. Manuella Ghazi who will check his cholesterol in about 2 months.  I asked him to send [place] those records.  He denies any chest pain or shortness of breath.  Blood pressure is well controlled.  He did recently retire.  03/23/2019  Charles Oconnor is seen today for follow-up.  He reports some progressively worsening shortness of breath with exertion.  He said  this has been going on for several months however recently had an intestinal obstruction.  He said he was up in [place] and started having symptoms and then came here and went to [place] long.  A CT scan indicated small bowel obstruction.  He has a history of prior surgeries including a robotic prostatectomy and appendectomy and therefore had surgical adhesions.  He is improving from that but does report that he has had some worsening symptoms.  He is concerned about coronary disease.  I performed his last heart catheterization in 2016 which showed some mild to moderate nonobstructive coronary disease.  His arteries actually looked somewhat better than it had previously.  His most recent lipid profile showed total cholesterol 153, triglycerides 179, HDL 39 and LDL 78.  05/10/2019  Charles Oconnor returns today for follow-up of his CT coronary angiogram.  He reports persistent shortness of breath with exertion.  His CT coronary angiogram was markedly abnormal demonstrating a high coronary calcium score 1119 which was 88th percentile for age and sex matched control.  There is heavy calcification of the right coronary and possible severe stenosis.  The mid LAD was also calcified and it was felt he had at least CAD RADS 3 disease.  CT FFR was sent and however did not demonstrate any  significant stenosis.  Although it is possible for a false negative FFR, it is fairly unlikely.  That being said he has extensive coronary disease and will need additional aggressive risk factor modification.  He is currently on pravastatin 80 mg and LDL remains above target at 78.   08/10/2019  Charles Oconnor is seen today in follow-up.  He still endorses shortness of breath.  He also has some anxiety.  This seems to be worsening he thinks.  He had recent pulmonary function testing which does show some mild reduction in FEV1 and FVC with some improvement after bronchodilator.  He has follow-up with Dr. Vaughan Browner in pulmonary later this  month.  I wonder however if his progressive dyspnea is his multivessel disease.  It could very well be is ischemic equivalent.  After switching him to rosuvastatin, he has had marked improvement in his lipids.  Total cholesterol now 125, triglycerides 137, HDL 40 and LDL 61.  Unfortunately, he reports intolerance of the statin causing what he feels is myalgias and some joint pain.  09/22/2019  Charles Oconnor returns today for follow-up.  He reports possibly some improvement in his dyspnea with Imdur.  He underwent a CT scan high resolution by Dr. Vaughan Browner, this demonstrated no evidence of any interstitial lung disease.  It is thought probably from a pulmonary standpoint that it is not explaining his shortness of breath.  As mentioned above he underwent cardiac catheterization by myself in 2016 which showed mild to moderate nonobstructive coronary disease.  That being said he had a lot of coronary calcium recently by CT coronary angiography, but FFR was negative.  I am concerned that this may be a false negative and that his symptoms represent more significant coronary disease.  PMHx:  Past Medical History:  Diagnosis Date  . Cervical spinal stenosis   . Complication of anesthesia   . Coronary artery disease    mild to moderate  . Dyslipidemia   . ED (erectile dysfunction)   . Hypertension   . PONV (postoperative nausea and vomiting)    Happened once 30 yrs ago. No problems since.  . Prostate cancer [facility])     Past Surgical History:  Procedure Laterality Date  . ANTERIOR CERVICAL DECOMPRESSION/DISCECTOMY FUSION 4 LEVELS N/A 09/22/2017   Procedure: ACDF - C3-C4 - C4-C5 - C5-C6 - C6-C7;  Surgeon: Earnie Larsson, MD;  Location: [facility];  Service: Neurosurgery;  Laterality: N/A;  . APPENDECTOMY  1985  . CARDIAC CATHETERIZATION  2002, 2011   mild-mod CAD  . CARDIAC CATHETERIZATION N/A 03/12/2015   Procedure: Left Heart Cath and Coronary Angiography;  Surgeon: Pixie Casino, MD;  Location: [facility];  Service: Cardiovascular;  Laterality: N/A;  . COLON RESECTION N/A 02/22/2019   Procedure: LAPAROSCOPIC COLON RESECTION AND DIVERTICULECTOMY;  Surgeon: Kieth Brightly, Arta Bruce, MD;  Location: [facility];  Service: General;  Laterality: N/A;  . HAND SURGERY Left    had joints fused  . LAPAROSCOPIC LYSIS OF ADHESIONS N/A 02/22/2019   Procedure: LAPAROSCOPIC LYSIS OF ADHESIONS;  Surgeon: Kieth Brightly Arta Bruce, MD;  Location: [facility];  Service: General;  Laterality: N/A;  . PROSTATECTOMY  03/2007  . TOTAL KNEE ARTHROPLASTY  04/2009    FAMHx:  Family History  Problem Relation Age of Onset  . Heart attack Father   . Diabetes Mother   . Heart failure Mother   . Coronary artery disease Mother   . Stroke Mother   . Prostate cancer Brother   . Heart  attack Brother        also CVA  . Heart attack Brother        also CVA  . Cancer Brother     SOCHx:   reports that he has never smoked. He has never used smokeless tobacco. He reports that he does not drink alcohol or use drugs.  ALLERGIES:  Allergies  Allergen Reactions  . Zocor [Simvastatin] Nausea Only and Other (See Comments)    Elevate liver enzymes    ROS: Pertinent items noted in HPI and remainder of comprehensive ROS otherwise negative.  HOME MEDS: Current Outpatient Medications  Medication Sig Dispense Refill  . amLODipine (NORVASC) 10 MG tablet Take 10 mg by mouth daily.    Marland Kitchen aspirin EC 81 MG tablet Take 81 mg by mouth daily.    . isosorbide mononitrate (IMDUR) 30 MG 24 hr tablet Take 1 tablet (30 mg total) by mouth daily. 90 tablet 3  . lisinopril-hydrochlorothiazide (PRINZIDE,ZESTORETIC) 20-12.5 MG per tablet Take 2 tablets by mouth daily.     . Multiple Vitamin (MULTIVITAMIN WITH MINERALS) TABS tablet Take 1 tablet by mouth daily.    . pantoprazole (PROTONIX) 40 MG tablet Take 40 mg by mouth every evening.     . rosuvastatin (CRESTOR) 40 MG tablet Take 1 tablet (40 mg total) by mouth daily. 90 tablet 3   No current  facility-administered medications for this visit.    LABS/IMAGING: No results found for this or any previous visit (from the past 48 hour(s)). No results found.  VITALS: BP 112/70   Pulse 71   Ht 6\' 1"  (1.854 m)   Wt 201 lb 3.2 oz (91.3 kg)   SpO2 95%   BMI 26.55 kg/m   EXAM: Deferred  EKG: Deferred  ASSESSMENT: 1. Progressive dyspnea on exertion/chest pressure -high CAC score of 1119, moderate to severe nonobstructive coronary disease and negative by FFR (04/2019) 2. Mild nonobstructive coronary disease by cath (2016), somewhat improved compared to his prior study in 2011 3. Mild to moderate abnormalities in pulmonary function 4. Hypertension-controlled 5. Dyslipidemia - controlled 6. Acceptable risk for planned total knee replacement  PLAN: 1.   Charles Oconnor has had progressive dyspnea on exertion and has multivessel coronary disease but felt to be nonobstructive however he has a very high calcium score.  He flatly denies any angina.  He does report his dyspnea has been worsening though and this is concerning for progressive disease.  Per his recent CT scan, there is no evidence of any interstitial lung disease.  My suspicion this is pulmonary is low.  It may be that his FFR did not capture significance of his lesions.  I would recommend right and left heart catheterization to more fully understand his dyspnea.  I discussed the risk, benefits and alternatives of the procedure, which he is already familiar with, and would recommend proceeding with a repeat left heart catheterization. He is agreeable to this.  Follow-up in 3 months with me.  Pixie Casino, MD, [facility], [place] Director of the [facility]  [facility] Diplomate of the American Board of Clinical Lipidology Attending Cardiologist  Direct Dial: [phone]  Fax: [phone]  Website:  [email or web address]  Kaede Clendenen 09/22/2019, 2:18 PM

## 2019-09-22 NOTE — Progress Notes (Signed)
OFFICE NOTE  Chief Complaint:  Follow-up chest pressure, dyspnea  Primary Care Physician: Mayra Neer, MD  HPI:  Charles Oconnor  is a 69 year old gentleman with history of hypertension, dyslipidemia and mild to moderate coronary disease. We have been following him for dyslipidemia and he has had liver enzyme abnormalities before on Zocor and was changed to Pravachol with pretty good control. Sometimes he gets chest discomfort; however, it is pretty short lived and possibly related to that. His EKG today shows a sinus bradycardia at 54 and I understand recently he was placed on a beta blocker which caused marked sinus bradycardia and that was promptly discontinued. At this point, although there is possibly an occasion for that given his history of coronary disease, his heart rate being low, he is unable to tolerate a beta blocker. He is on an ACE inhibitor which is pretty good at controlling his blood pressure as well as amlodipine.   Charles Oconnor returns today for followup. He reports doing fairly well. He's recently started doing some walking and has actually lost about 10 pounds in the last 6 months. He says he does feel better and have more energy. A portion is developed some peripheral neuropathy. He's been started on Neurontin by a neurologist in [place]. He seems to be tolerating this medicine although has some occasional lightheadedness and dizziness. He is peripheral nerve pain is better however does have numbness in his feet. He occasionally gets some mild swelling. He also was told that he had high vitamin B6 levels and was told to stop taking his multivitamin.  I the pleasure see Charles Oconnor back in the office today. Overall he is doing fairly well except he is struggling with low back pain and some neuropathic pain in his legs. He's been getting back injections without much improvement. He's also been describing some occasional chest discomfort and progressive fatigue over  the past several months. He feels like he has no energy. There is also complaints of mid back pain between his shoulder blades which occurs on unusual occasions and not associated with his low back pain. Sometimes it's a little worse when exerting himself although he's not been as active due to his back problems. As a reminder he had heart catheterization in 2011 with Dr. Tina Griffiths, which demonstrated mild to moderate coronary artery disease.  He recently had lab work through his primary care provider which looks fairly normal except for an elevated cholesterol profile. His total cholesterol is 166, HL 43, triglycerides 112 and LDL 101. He's currently on pravastatin 80 mg daily and says that he has had problems with myalgias and liver function abnormalities on simvastatin in the past.  Charles Oconnor returns today for follow-up. He recently underwent a stress test which showed the following:   The left ventricular ejection fraction is mildly decreased (45-54%).  Nuclear stress EF: 52%.  No T wave inversion was noted during stress.  There was no ST segment deviation noted during stress.  Defect 1: There is a medium defect of moderate severity.  There is a moderate-sized and intensity, partially reversible inferoseptal perfusion defect which could represent ischemia. LVEF 52% with basal inferoseptal dyskinesis. This is an intermediate risk study.   Based on these findings, I recommended either continued medical therapy or we could consider cardiac catheterization. Subsequently he felt like his chest pain was worsening and ultimately based on his prior catheterization results, we decided to proceed with cardiac catheterization. The results are as follows:  Prox RCA lesion, 40% stenosed. The lesion was not previously treated.  Prox Cx to Dist Cx lesion, 20% stenosed. The lesion was not previously treated.  Ost 1st Diag to 1st Diag lesion, 40% stenosed. The lesion was not previously  treated.  Dist LAD lesion, 20% stenosed. The lesion was not previously treated.  The left ventricular systolic function is normal.  Mild, non-obstructive CAD - somewhat improved from prior cath in 2011. Normal LV function. False positive nuclear stress test. At this point, Mr. Haberman is doing much better and denies any chest pain or worsening shortness of breath. He gets a small amount of ankle edema which I attribute to amlodipine.  04/01/2016  Charles Oconnor returns today for follow-up. He denies any chest pain or worsening shortness of breath. He apparently had a sore throat yesterday and has some drainage today. He is interested in some over-the-counter medications for that. He's had no chest pain that sounds anginal. Blood pressure well controlled. Recent cholesterol check through his primary care provider shows good control with LDL less than 100.  03/13/2017  Charles Oconnor returns today for follow-up.  Overall he is doing exceedingly well.  Blood pressure is well-controlled today 126/70.  His body mass index is near normal.  Cholesterol is due to be assessed but has been well controlled.  EKG shows sinus bradycardia at 52 without ischemic changes.  He denies any chest pain or worsening shortness of breath.  03/19/2018  Charles Oconnor is seen today in follow-up.  He recently underwent cervical surgery for pain.  He had a fusion of the lower cervical vertebrae by Dr. Trenton Gammon and is doing much better.  He did have postoperative swelling and had steroids because of difficulty breathing.  Lipids a year ago were well controlled.  He has an upcoming appoint with Dr. Manuella Ghazi who will check his cholesterol in about 2 months.  I asked him to send [place] those records.  He denies any chest pain or shortness of breath.  Blood pressure is well controlled.  He did recently retire.  03/23/2019  Charles Oconnor is seen today for follow-up.  He reports some progressively worsening shortness of breath with exertion.  He said  this has been going on for several months however recently had an intestinal obstruction.  He said he was up in [place] and started having symptoms and then came here and went to [place] long.  A CT scan indicated small bowel obstruction.  He has a history of prior surgeries including a robotic prostatectomy and appendectomy and therefore had surgical adhesions.  He is improving from that but does report that he has had some worsening symptoms.  He is concerned about coronary disease.  I performed his last heart catheterization in 2016 which showed some mild to moderate nonobstructive coronary disease.  His arteries actually looked somewhat better than it had previously.  His most recent lipid profile showed total cholesterol 153, triglycerides 179, HDL 39 and LDL 78.  05/10/2019  Mr. Langston Oconnor returns today for follow-up of his CT coronary angiogram.  He reports persistent shortness of breath with exertion.  His CT coronary angiogram was markedly abnormal demonstrating a high coronary calcium score 1119 which was 88th percentile for age and sex matched control.  There is heavy calcification of the right coronary and possible severe stenosis.  The mid LAD was also calcified and it was felt he had at least CAD RADS 3 disease.  CT FFR was sent and however did not demonstrate any  significant stenosis.  Although it is possible for a false negative FFR, it is fairly unlikely.  That being said he has extensive coronary disease and will need additional aggressive risk factor modification.  He is currently on pravastatin 80 mg and LDL remains above target at 78.   08/10/2019  Mr. Langston Oconnor is seen today in follow-up.  He still endorses shortness of breath.  He also has some anxiety.  This seems to be worsening he thinks.  He had recent pulmonary function testing which does show some mild reduction in FEV1 and FVC with some improvement after bronchodilator.  He has follow-up with Dr. Vaughan Browner in pulmonary later this  month.  I wonder however if his progressive dyspnea is his multivessel disease.  It could very well be is ischemic equivalent.  After switching him to rosuvastatin, he has had marked improvement in his lipids.  Total cholesterol now 125, triglycerides 137, HDL 40 and LDL 61.  Unfortunately, he reports intolerance of the statin causing what he feels is myalgias and some joint pain.  09/22/2019  Mr. Langston Oconnor returns today for follow-up.  He reports possibly some improvement in his dyspnea with Imdur.  He underwent a CT scan high resolution by Dr. Vaughan Browner, this demonstrated no evidence of any interstitial lung disease.  It is thought probably from a pulmonary standpoint that it is not explaining his shortness of breath.  As mentioned above he underwent cardiac catheterization by myself in 2016 which showed mild to moderate nonobstructive coronary disease.  That being said he had a lot of coronary calcium recently by CT coronary angiography, but FFR was negative.  I am concerned that this may be a false negative and that his symptoms represent more significant coronary disease.  PMHx:  Past Medical History:  Diagnosis Date  . Cervical spinal stenosis   . Complication of anesthesia   . Coronary artery disease    mild to moderate  . Dyslipidemia   . ED (erectile dysfunction)   . Hypertension   . PONV (postoperative nausea and vomiting)    Happened once 30 yrs ago. No problems since.  . Prostate cancer [facility])     Past Surgical History:  Procedure Laterality Date  . ANTERIOR CERVICAL DECOMPRESSION/DISCECTOMY FUSION 4 LEVELS N/A 09/22/2017   Procedure: ACDF - C3-C4 - C4-C5 - C5-C6 - C6-C7;  Surgeon: Earnie Larsson, MD;  Location: [place];  Service: Neurosurgery;  Laterality: N/A;  . APPENDECTOMY  1985  . CARDIAC CATHETERIZATION  2002, 2011   mild-mod CAD  . CARDIAC CATHETERIZATION N/A 03/12/2015   Procedure: Left Heart Cath and Coronary Angiography;  Surgeon: Pixie Casino, MD;  Location: [place] [facility];  Service: Cardiovascular;  Laterality: N/A;  . COLON RESECTION N/A 02/22/2019   Procedure: LAPAROSCOPIC COLON RESECTION AND DIVERTICULECTOMY;  Surgeon: Kieth Brightly, Arta Bruce, MD;  Location: [facility];  Service: General;  Laterality: N/A;  . HAND SURGERY Left    had joints fused  . LAPAROSCOPIC LYSIS OF ADHESIONS N/A 02/22/2019   Procedure: LAPAROSCOPIC LYSIS OF ADHESIONS;  Surgeon: Kieth Brightly Arta Bruce, MD;  Location: [facility];  Service: General;  Laterality: N/A;  . PROSTATECTOMY  03/2007  . TOTAL KNEE ARTHROPLASTY  04/2009    FAMHx:  Family History  Problem Relation Age of Onset  . Heart attack Father   . Diabetes Mother   . Heart failure Mother   . Coronary artery disease Mother   . Stroke Mother   . Prostate cancer Brother   . Heart  attack Brother        also CVA  . Heart attack Brother        also CVA  . Cancer Brother     SOCHx:   reports that he has never smoked. He has never used smokeless tobacco. He reports that he does not drink alcohol or use drugs.  ALLERGIES:  Allergies  Allergen Reactions  . Zocor [Simvastatin] Nausea Only and Other (See Comments)    Elevate liver enzymes    ROS: Pertinent items noted in HPI and remainder of comprehensive ROS otherwise negative.  HOME MEDS: Current Outpatient Medications  Medication Sig Dispense Refill  . amLODipine (NORVASC) 10 MG tablet Take 10 mg by mouth daily.    Marland Kitchen aspirin EC 81 MG tablet Take 81 mg by mouth daily.    . isosorbide mononitrate (IMDUR) 30 MG 24 hr tablet Take 1 tablet (30 mg total) by mouth daily. 90 tablet 3  . lisinopril-hydrochlorothiazide (PRINZIDE,ZESTORETIC) 20-12.5 MG per tablet Take 2 tablets by mouth daily.     . Multiple Vitamin (MULTIVITAMIN WITH MINERALS) TABS tablet Take 1 tablet by mouth daily.    . pantoprazole (PROTONIX) 40 MG tablet Take 40 mg by mouth every evening.     . rosuvastatin (CRESTOR) 40 MG tablet Take 1 tablet (40 mg total) by mouth daily. 90 tablet 3   No current  facility-administered medications for this visit.    LABS/IMAGING: No results found for this or any previous visit (from the past 48 hour(s)). No results found.  VITALS: BP 112/70   Pulse 71   Ht 6\' 1"  (1.854 m)   Wt 201 lb 3.2 oz (91.3 kg)   SpO2 95%   BMI 26.55 kg/m   EXAM: Deferred  EKG: Deferred  ASSESSMENT: 1. Progressive dyspnea on exertion/chest pressure -high CAC score of 1119, moderate to severe nonobstructive coronary disease and negative by FFR (04/2019) 2. Mild nonobstructive coronary disease by cath (2016), somewhat improved compared to his prior study in 2011 3. Mild to moderate abnormalities in pulmonary function 4. Hypertension-controlled 5. Dyslipidemia - controlled 6. Acceptable risk for planned total knee replacement  PLAN: 1.   Mr. Langston Oconnor has had progressive dyspnea on exertion and has multivessel coronary disease but felt to be nonobstructive however he has a very high calcium score.  He flatly denies any angina.  He does report his dyspnea has been worsening though and this is concerning for progressive disease.  Per his recent CT scan, there is no evidence of any interstitial lung disease.  My suspicion this is pulmonary is low.  It may be that his FFR did not capture significance of his lesions.  I would recommend right and left heart catheterization to more fully understand his dyspnea.  I discussed the risk, benefits and alternatives of the procedure, which he is already familiar with, and would recommend proceeding with a repeat left heart catheterization. He is agreeable to this.  Follow-up in 3 months with me.  Pixie Casino, MD, [facility] [place] Director of the [facility] Lipid Disorders &  Cardiovascular Risk Reduction Clinic Diplomate of the American Board of Clinical Lipidology Attending Cardiologist  Direct Dial: [phone]  Fax: [phone]  Website:  [email or web address]  Keelee Yankey 09/22/2019, 2:18 PM

## 2019-09-23 LAB — CBC
Hematocrit: 45.3 % (ref 37.5–51.0)
Hemoglobin: 15.9 g/dL (ref 13.0–17.7)
MCH: 30 pg (ref 26.6–33.0)
MCHC: 35.1 g/dL (ref 31.5–35.7)
MCV: 86 fL (ref 79–97)
Platelets: 248 10*3/uL (ref 150–450)
RBC: 5.3 x10E6/uL (ref 4.14–5.80)
RDW: 13 % (ref 11.6–15.4)
WBC: 9.1 10*3/uL (ref 3.4–10.8)

## 2019-09-23 LAB — BASIC METABOLIC PANEL
BUN/Creatinine Ratio: 13 (ref 10–24)
BUN: 16 mg/dL (ref 8–27)
CO2: 25 mmol/L (ref 20–29)
Calcium: 10.8 mg/dL — ABNORMAL HIGH (ref 8.6–10.2)
Chloride: 103 mmol/L (ref 96–106)
Creatinine, Ser: 1.27 mg/dL (ref 0.76–1.27)
GFR calc Af Amer: 66 mL/min/{1.73_m2} (ref >59)
GFR calc non Af Amer: 57 mL/min/{1.73_m2} — ABNORMAL LOW (ref >59)
Glucose: 98 mg/dL (ref 65–99)
Potassium: 5.1 mmol/L (ref 3.5–5.2)
Sodium: 144 mmol/L (ref 134–144)

## 2019-09-24 ENCOUNTER — Other Ambulatory Visit (HOSPITAL_COMMUNITY)
Admission: RE | Admit: 2019-09-24 | Discharge: 2019-09-24 | Disposition: A | Discharge status: Home / Self Care | Payer: Medicare Other | Source: Ambulatory Visit | Attending: Cardiology | Admitting: Cardiology

## 2019-09-24 DIAGNOSIS — Z20822 Contact with and (suspected) exposure to covid-19: Secondary | ICD-10-CM | POA: Diagnosis not present

## 2019-09-24 DIAGNOSIS — Z01812 Encounter for preprocedural laboratory examination: Secondary | ICD-10-CM | POA: Diagnosis not present

## 2019-09-24 LAB — SARS CORONAVIRUS 2 (TAT 6-24 HRS): SARS Coronavirus 2: NEGATIVE

## 2019-09-26 ENCOUNTER — Telehealth: Payer: Self-pay | Admitting: *Deleted

## 2019-09-26 NOTE — Telephone Encounter (Signed)
Pt contacted pre-catheterization scheduled at [facility] for: Tuesday Sep 27, 2019 7:30 AM Verified arrival time and place: [facility]) at: 5:30 AM   No solid food after midnight prior to cath, clear liquids until 5 AM day of procedure.  Hold: Lisinopril-HCT- day before and day of procedure-GFR 57-pt had already taken today  Except hold medications AM meds can be  taken pre-cath with sip of water including: ASA 81 mg   Confirmed patient has responsible adult to drive home post procedure and observe 24 hours after arriving home: yes  You are allowed ONE visitor in the waiting room during your procedure. Both you and your visitor must wear masks.     COVID-19 Pre-Screening Questions:  . In the past 7 to 10 days have you had a cough,  shortness of breath, headache, congestion, fever (100 or greater) body aches, chills, sore throat, or sudden loss of taste or sense of smell?no . Have you been around anyone with known Covid 19 in the past 7 to 10 days? no . Have you been around anyone who is awaiting Covid 19 test results in the past 7 to 10 days?no . Have you been around anyone who has mentioned symptoms of Covid 19 within the past 7 to 10 days? no  Reviewed procedure/mask/visitor instructions, COVID-19 screening questions with patient.

## 2019-09-27 ENCOUNTER — Ambulatory Visit (HOSPITAL_COMMUNITY)
Admission: RE | Admit: 2019-09-27 | Discharge: 2019-09-27 | Disposition: A | Discharge status: Home / Self Care | Payer: Medicare Other | Attending: Cardiology | Admitting: Cardiology

## 2019-09-27 ENCOUNTER — Other Ambulatory Visit: Payer: Self-pay

## 2019-09-27 ENCOUNTER — Encounter (HOSPITAL_COMMUNITY)
Admission: RE | Disposition: A | Discharge status: Home / Self Care | Payer: Self-pay | Source: Home / Self Care | Attending: Cardiology

## 2019-09-27 DIAGNOSIS — Z79899 Other long term (current) drug therapy: Secondary | ICD-10-CM | POA: Diagnosis not present

## 2019-09-27 DIAGNOSIS — Z7982 Long term (current) use of aspirin: Secondary | ICD-10-CM | POA: Diagnosis not present

## 2019-09-27 DIAGNOSIS — I10 Essential (primary) hypertension: Secondary | ICD-10-CM | POA: Insufficient documentation

## 2019-09-27 DIAGNOSIS — I2583 Coronary atherosclerosis due to lipid rich plaque: Secondary | ICD-10-CM

## 2019-09-27 DIAGNOSIS — I2510 Atherosclerotic heart disease of native coronary artery without angina pectoris: Secondary | ICD-10-CM | POA: Insufficient documentation

## 2019-09-27 DIAGNOSIS — R0602 Shortness of breath: Secondary | ICD-10-CM | POA: Diagnosis not present

## 2019-09-27 DIAGNOSIS — Z8249 Family history of ischemic heart disease and other diseases of the circulatory system: Secondary | ICD-10-CM | POA: Diagnosis not present

## 2019-09-27 DIAGNOSIS — R079 Chest pain, unspecified: Secondary | ICD-10-CM | POA: Diagnosis not present

## 2019-09-27 DIAGNOSIS — R0789 Other chest pain: Secondary | ICD-10-CM

## 2019-09-27 DIAGNOSIS — Z8546 Personal history of malignant neoplasm of prostate: Secondary | ICD-10-CM | POA: Insufficient documentation

## 2019-09-27 DIAGNOSIS — G629 Polyneuropathy, unspecified: Secondary | ICD-10-CM | POA: Diagnosis not present

## 2019-09-27 DIAGNOSIS — E785 Hyperlipidemia, unspecified: Secondary | ICD-10-CM | POA: Diagnosis not present

## 2019-09-27 DIAGNOSIS — R0600 Dyspnea, unspecified: Secondary | ICD-10-CM | POA: Diagnosis not present

## 2019-09-27 HISTORY — PX: RIGHT/LEFT HEART CATH AND CORONARY ANGIOGRAPHY: CATH118266

## 2019-09-27 LAB — POCT I-STAT EG7
Acid-Base Excess: 2 mmol/L (ref 0.0–2.0)
Acid-base deficit: 1 mmol/L (ref 0.0–2.0)
Acid-base deficit: 2 mmol/L (ref 0.0–2.0)
Bicarbonate: 28.4 mmol/L — ABNORMAL HIGH (ref 20.0–28.0)
Bicarbonate: 23.5 mmol/L (ref 20.0–28.0)
Bicarbonate: 25.1 mmol/L (ref 20.0–28.0)
Calcium, Ion: 1.3 mmol/L (ref 1.15–1.40)
Calcium, Ion: 0.93 mmol/L — ABNORMAL LOW (ref 1.15–1.40)
Calcium, Ion: 1.07 mmol/L — ABNORMAL LOW (ref 1.15–1.40)
HCT: 38 % — ABNORMAL LOW (ref 39.0–52.0)
HCT: 31 % — ABNORMAL LOW (ref 39.0–52.0)
HCT: 32 % — ABNORMAL LOW (ref 39.0–52.0)
Hemoglobin: 12.9 g/dL — ABNORMAL LOW (ref 13.0–17.0)
Hemoglobin: 10.5 g/dL — ABNORMAL LOW (ref 13.0–17.0)
Hemoglobin: 10.9 g/dL — ABNORMAL LOW (ref 13.0–17.0)
O2 Saturation: 75 %
O2 Saturation: 72 %
O2 Saturation: 79 %
Potassium: 3.7 mmol/L (ref 3.5–5.1)
Potassium: 2.9 mmol/L — ABNORMAL LOW (ref 3.5–5.1)
Potassium: 3.1 mmol/L — ABNORMAL LOW (ref 3.5–5.1)
Sodium: 144 mmol/L (ref 135–145)
Sodium: 147 mmol/L — ABNORMAL HIGH (ref 135–145)
Sodium: 151 mmol/L — ABNORMAL HIGH (ref 135–145)
TCO2: 30 mmol/L (ref 22–32)
TCO2: 25 mmol/L (ref 22–32)
TCO2: 26 mmol/L (ref 22–32)
pCO2, Ven: 49.9 mmHg (ref 44.0–60.0)
pCO2, Ven: 41.2 mmHg — ABNORMAL LOW (ref 44.0–60.0)
pCO2, Ven: 45 mmHg (ref 44.0–60.0)
pH, Ven: 7.364 (ref 7.250–7.430)
pH, Ven: 7.355 (ref 7.250–7.430)
pH, Ven: 7.365 (ref 7.250–7.430)
pO2, Ven: 42 mmHg (ref 32.0–45.0)
pO2, Ven: 40 mmHg (ref 32.0–45.0)
pO2, Ven: 45 mmHg (ref 32.0–45.0)

## 2019-09-27 LAB — POCT I-STAT 7, (LYTES, BLD GAS, ICA,H+H)
Acid-Base Excess: 1 mmol/L (ref 0.0–2.0)
Bicarbonate: 25.8 mmol/L (ref 20.0–28.0)
Calcium, Ion: 1.12 mmol/L — ABNORMAL LOW (ref 1.15–1.40)
HCT: 34 % — ABNORMAL LOW (ref 39.0–52.0)
Hemoglobin: 11.6 g/dL — ABNORMAL LOW (ref 13.0–17.0)
O2 Saturation: 98 %
Potassium: 3.3 mmol/L — ABNORMAL LOW (ref 3.5–5.1)
Sodium: 147 mmol/L — ABNORMAL HIGH (ref 135–145)
TCO2: 27 mmol/L (ref 22–32)
pCO2 arterial: 42.6 mmHg (ref 32.0–48.0)
pH, Arterial: 7.39 (ref 7.350–7.450)
pO2, Arterial: 104 mmHg (ref 83.0–108.0)

## 2019-09-27 SURGERY — RIGHT/LEFT HEART CATH AND CORONARY ANGIOGRAPHY
Anesthesia: LOCAL

## 2019-09-27 MED ORDER — VERAPAMIL HCL 2.5 MG/ML IV SOLN
INTRAVENOUS | Status: AC
  Filled 2019-09-27: qty 2

## 2019-09-27 MED ORDER — SODIUM CHLORIDE 0.9% FLUSH: 3.0000 mL | INTRAVENOUS | Status: DC | PRN

## 2019-09-27 MED ORDER — HEPARIN (PORCINE) IN NACL 1000-0.9 UT/500ML-% IV SOLN
INTRAVENOUS | Status: AC
  Filled 2019-09-27: qty 1000

## 2019-09-27 MED ORDER — IOHEXOL 350 MG/ML SOLN
INTRAVENOUS | Status: DC | PRN
  Administered 2019-09-27: 45 mL

## 2019-09-27 MED ORDER — ACETAMINOPHEN 325 MG PO TABS: 650.0000 mg | ORAL_TABLET | ORAL | Status: DC | PRN

## 2019-09-27 MED ORDER — FENTANYL CITRATE (PF) 100 MCG/2ML IJ SOLN
INTRAMUSCULAR | Status: AC
  Filled 2019-09-27: qty 2

## 2019-09-27 MED ORDER — HYDRALAZINE HCL 20 MG/ML IJ SOLN: 10.0000 mg | INTRAMUSCULAR | Status: DC | PRN

## 2019-09-27 MED ORDER — SODIUM CHLORIDE 0.9 % IV SOLN: INTRAVENOUS | Status: AC

## 2019-09-27 MED ORDER — HEPARIN (PORCINE) IN NACL 1000-0.9 UT/500ML-% IV SOLN
INTRAVENOUS | Status: DC | PRN
  Administered 2019-09-27 (×2): 500 mL

## 2019-09-27 MED ORDER — VERAPAMIL HCL 2.5 MG/ML IV SOLN
INTRAVENOUS | Status: DC | PRN
  Administered 2019-09-27: 10 mL via INTRA_ARTERIAL

## 2019-09-27 MED ORDER — SODIUM CHLORIDE 0.9 % IV SOLN: INTRAVENOUS | Status: DC

## 2019-09-27 MED ORDER — LIDOCAINE HCL (PF) 1 % IJ SOLN
INTRAMUSCULAR | Status: DC | PRN
  Administered 2019-09-27 (×2): 2 mL

## 2019-09-27 MED ORDER — ASPIRIN 81 MG PO CHEW: 81.0000 mg | CHEWABLE_TABLET | ORAL | Status: DC

## 2019-09-27 MED ORDER — SODIUM CHLORIDE 0.9% FLUSH: 3.0000 mL | Freq: Two times a day (BID) | INTRAVENOUS | Status: DC

## 2019-09-27 MED ORDER — FENTANYL CITRATE (PF) 100 MCG/2ML IJ SOLN
INTRAMUSCULAR | Status: DC | PRN
  Administered 2019-09-27 (×2): 25 ug via INTRAVENOUS

## 2019-09-27 MED ORDER — LABETALOL HCL 5 MG/ML IV SOLN: 10.0000 mg | INTRAVENOUS | Status: DC | PRN

## 2019-09-27 MED ORDER — MIDAZOLAM HCL 2 MG/2ML IJ SOLN
INTRAMUSCULAR | Status: AC
  Filled 2019-09-27: qty 2

## 2019-09-27 MED ORDER — HEPARIN SODIUM (PORCINE) 1000 UNIT/ML IJ SOLN
INTRAMUSCULAR | Status: DC | PRN
  Administered 2019-09-27: 4500 [IU] via INTRAVENOUS

## 2019-09-27 MED ORDER — LIDOCAINE HCL (PF) 1 % IJ SOLN
INTRAMUSCULAR | Status: AC
  Filled 2019-09-27: qty 30

## 2019-09-27 MED ORDER — ONDANSETRON HCL 4 MG/2ML IJ SOLN: 4.0000 mg | Freq: Four times a day (QID) | INTRAMUSCULAR | Status: DC | PRN

## 2019-09-27 MED ORDER — SODIUM CHLORIDE 0.9 % IV SOLN: 250.0000 mL | INTRAVENOUS | Status: DC | PRN

## 2019-09-27 MED ORDER — HEPARIN SODIUM (PORCINE) 1000 UNIT/ML IJ SOLN
INTRAMUSCULAR | Status: AC
  Filled 2019-09-27: qty 1

## 2019-09-27 MED ORDER — MIDAZOLAM HCL 2 MG/2ML IJ SOLN
INTRAMUSCULAR | Status: DC | PRN
  Administered 2019-09-27 (×2): 1 mg via INTRAVENOUS

## 2019-09-27 SURGICAL SUPPLY — 13 items
CATH 5FR JL3.5 JR4 ANG PIG MP (CATHETERS) ×2 IMPLANT
CATH BALLN WEDGE 5F 110CM (CATHETERS) ×2 IMPLANT
DEVICE RAD COMP TR BAND LRG (VASCULAR PRODUCTS) ×2 IMPLANT
GLIDESHEATH SLEND SS 6F .021 (SHEATH) ×2 IMPLANT
GUIDEWIRE INQWIRE 1.5J.035X260 (WIRE) ×1 IMPLANT
INQWIRE 1.5J .035X260CM (WIRE) ×2
KIT HEART LEFT (KITS) ×2 IMPLANT
PACK CARDIAC CATHETERIZATION (CUSTOM PROCEDURE TRAY) ×2 IMPLANT
SHEATH GLIDE SLENDER 4/5FR (SHEATH) ×2 IMPLANT
SHEATH PROBE COVER 6X72 (BAG) ×2 IMPLANT
TRANSDUCER W/STOPCOCK (MISCELLANEOUS) ×2 IMPLANT
TUBING CIL FLEX 10 FLL-RA (TUBING) ×2 IMPLANT
WIRE EMERALD 3MM-J .025X260CM (WIRE) ×2 IMPLANT

## 2019-09-27 NOTE — Interval H&P Note (Signed)
History and Physical Interval Note:  09/27/2019 8:06 AM  Charles Oconnor  has presented today for surgery, with the diagnosis of shortness of breath - chest pain.  The various methods of treatment have been discussed with the patient and family. After consideration of risks, benefits and other options for treatment, the patient has consented to  Procedure(s): RIGHT/LEFT HEART CATH AND CORONARY ANGIOGRAPHY (N/A) and possible coronary angioplasty as a surgical intervention.  The patient's history has been reviewed, patient examined, no change in status, stable for surgery.  I have reviewed the patient's chart and labs.  Questions were answered to the patient's satisfaction.     [name]

## 2019-09-27 NOTE — Discharge Instructions (Signed)
DRINK PLENTY OF FLUIDS FOR THE NEXT 2-3 DAYS.  KEEP ARM ELEVATED THE REMAINDER OF THE DAY.  Radial Site Care  This sheet gives you information about how to care for yourself after your procedure. Your health care provider may also give you more specific instructions. If you have problems or questions, contact your health care provider. What can I expect after the procedure? After the procedure, it is common to have:  Bruising and tenderness at the catheter insertion area. Follow these instructions at home: Medicines  Take over-the-counter and prescription medicines only as told by your health care provider. Insertion site care 1. Follow instructions from your health care provider about how to take care of your insertion site. Make sure you: ? Wash your hands with soap and water before you change your bandage (dressing). If soap and water are not available, use hand sanitizer. ? Change your dressing as told by your health care provider. 2. Check your insertion site every day for signs of infection. Check for: ? Redness, swelling, or pain. ? Fluid or blood. ? Pus or a bad smell. ? Warmth. 3. Do not take baths, swim, or use a hot tub for 5 days. 4. You may shower 24-48 hours after the procedure. ? Remove the dressing and gently wash the site with plain soap and water. ? Pat the area dry with a clean towel. ? Do not rub the site. That could cause bleeding. 5. Do not apply powder or lotion to the site. Activity  1. For 24 hours after the procedure, or as directed by your health care provider: ? Do not flex or bend the affected arm. ? Do not push or pull heavy objects with the affected arm. ? Do not drive yourself home from the hospital or clinic. You may drive 24 hours after the procedure. ? Do not operate machinery or power tools. 2. Do not push, pull or lift anything that is heavier than 10 lb for 5 days. 3. Ask your health care provider when it is okay to: ? Return to work or  school. ? Resume usual physical activities or sports. ? Resume sexual activity. General instructions  If the catheter site starts to bleed, raise your arm and put firm pressure on the site. If the bleeding does not stop, get help right away. This is a medical emergency.  If you went home on the same day as your procedure, a responsible adult should be with you for the first 24 hours after you arrive home.  Keep all follow-up visits as told by your health care provider. This is important. Contact a health care provider if:  You have a fever.  You have redness, swelling, or yellow drainage around your insertion site. Get help right away if:  You have unusual pain at the radial site.  The catheter insertion area swells very fast.  The insertion area is bleeding, and the bleeding does not stop when you hold steady pressure on the area.  Your arm or hand becomes pale, cool, tingly, or numb. These symptoms may represent a serious problem that is an emergency. Do not wait to see if the symptoms will go away. Get medical help right away. Call your local emergency services (911 in the [place] Do not drive yourself to the hospital. Summary  After the procedure, it is common to have bruising and tenderness at the site.  Follow instructions from your health care provider about how to take care of your radial site wound. Check   the wound every day for signs of infection.  Do not push, pull or lift anything that is heavier than 10 lb for 5 days.  This information is not intended to replace advice given to you by your health care provider. Make sure you discuss any questions you have with your health care provider. Document Revised: 06/03/2017 Document Reviewed: 06/03/2017 Elsevier Patient Education  2020 Elsevier Inc. 

## 2019-10-04 ENCOUNTER — Ambulatory Visit (INDEPENDENT_AMBULATORY_CARE_PROVIDER_SITE_OTHER): Payer: Medicare Other | Admitting: Pulmonary Disease

## 2019-10-04 ENCOUNTER — Other Ambulatory Visit: Payer: Self-pay

## 2019-10-04 ENCOUNTER — Encounter: Payer: Self-pay | Admitting: Pulmonary Disease

## 2019-10-04 VITALS — BP 120/60 | HR 72 | Temp 98.0°F | Ht 73.0 in | Wt 203.0 lb

## 2019-10-04 DIAGNOSIS — I2583 Coronary atherosclerosis due to lipid rich plaque: Secondary | ICD-10-CM

## 2019-10-04 DIAGNOSIS — R0600 Dyspnea, unspecified: Secondary | ICD-10-CM

## 2019-10-04 DIAGNOSIS — I2510 Atherosclerotic heart disease of native coronary artery without angina pectoris: Secondary | ICD-10-CM | POA: Diagnosis not present

## 2019-10-04 NOTE — Patient Instructions (Signed)
As you continue to have shortness of breath we will schedule you for a cardiopulmonary exercise test Follow-up in 1 to 2 months.

## 2019-10-04 NOTE — Progress Notes (Signed)
Charles Oconnor    [id number]    04-Jul-1950  Primary Care Physician:Shaw, Nathen May, MD  Referring Physician: Mayra Neer, MD [place] [employer] [place],  [place] [id number]  Chief complaint: Follow-up for dyspnea  HPI: 69 year old with history of hypertension, hyperlipidemia, prostate cancer Complains of increasing dyspnea on exertion since December 2020.  No symptoms at rest.  No cough, wheezing, sputum production. He had PFTs which showed very minimal diffusion impairment.  Dr. Debara Pickett his cardiologist is considering heart catheterization as there is no evidence of pulmonary impairment  Prostate cancer diagnosed in 2008 and treated with prostatectomy.  His PSAs have been normal.  Pets: No pets Occupation: Retired Airline pilot. Exposures: No known exposures.  No mold, hot tub, Jacuzzi.  No down pillows or comforters Smoking history: Never smoker Travel history: No significant travel history Relevant family history: No significant family history of lung disease  Interim history: Here for follow-up of ongoing evaluation for dyspnea High-res CT reviewed with no interstitial lung disease.  He underwent a cardiac cath with mild nonobstructive coronary artery disease and no pulmonary hypertension.  Outpatient Encounter Medications as of 10/04/2019  Medication Sig  . amLODipine (NORVASC) 10 MG tablet Take 10 mg by mouth daily.  Marland Kitchen aspirin EC 81 MG tablet Take 81 mg by mouth daily.  . isosorbide mononitrate (IMDUR) 30 MG 24 hr tablet Take 1 tablet (30 mg total) by mouth daily.  Marland Kitchen lisinopril-hydrochlorothiazide (PRINZIDE,ZESTORETIC) 20-12.5 MG per tablet Take 2 tablets by mouth daily.   . Multiple Vitamin (MULTIVITAMIN WITH MINERALS) TABS tablet Take 1 tablet by mouth daily.  . pantoprazole (PROTONIX) 40 MG tablet Take 40 mg by mouth daily.   . rosuvastatin (CRESTOR) 40 MG tablet Take 1 tablet (40 mg total) by mouth daily.   No facility-administered encounter  medications on file as of 10/04/2019.   Physical Exam: Blood pressure 120/60, pulse 72, temperature 98 F (36.7 C), temperature source Oral, height 6\' 1"  (1.854 m), weight 203 lb (92.1 kg), SpO2 97 %. Gen:      No acute distress HEENT:  EOMI, sclera anicteric Neck:     No masses; no thyromegaly Lungs:    Clear to auscultation bilaterally; normal respiratory effort CV:         Regular rate and rhythm; no murmurs Abd:      + bowel sounds; soft, non-tender; no palpable masses, no distension Ext:    No edema; adequate peripheral perfusion Skin:      Warm and dry; no rash Neuro: alert and oriented x 3 Psych: normal mood and affect  Data Reviewed: Imaging: CT coronaries 04/21/2019-visualized lungs show linear scarring the right middle lobe and right lower lobe.  Elevated right hemidiaphragm. HRCT 09/19/2019-elevated right hemidiaphragm with right lung base atelectasis.  No interstitial lung disease, coronary atherosclerosis, ectatic ascending thoracic aorta. I have reviewed the images personally.  PFTs: 07/14/2019 FVC 3.17 [64%], FEV1 2.65 [72%], F/F 84, TLC 6.16 [80%], DLCO 21.23 [74%,], DLCO/VA 105% Minimal diffusion defect which corrects for alveolar volume.  Cardiac: Echocardiogram 03/31/2019-LVEF 55-60%, mildly elevated RVSP-35  Cardiac cath 09/27/2019 1. Mild non-obstructive CAD - no change from 2016 2. LVEF 60-65% - no regional wall motion abnormalities 3. Nomal hemodynamics with small but prominent v-wave in wedge tracing suggestive of either mild MR or diastolic dysfunction PA = 22/4 (11) PCW = 13 Fick cardiac output/index = 5.1/2.4 PVR = < 1.0 WU  Assessment:  Consult for dyspnea Not sure if  this is a pulmonary issue.  His PFTs show very minimal diffusion impairment which corrects for alveolar volume. High-resolution CT noted with mild interstitial lung disease.  There is mild atelectasis associated with elevated hemidiaphragm. It is possible that the hemidiaphragm elevation  may be causing restrictive process during exercise but not obvious on resting PFTs Cardiac work-up unremarkable as noted above.  Given persistent symptoms of dyspnea we will schedule cardiopulmonary exercise test.  Plan/Recommendations: Cardiopulmonary exercise stress  Marshell Garfinkel MD [facility] 10/04/2019, 10:15 AM  CC: Mayra Neer, MD

## 2019-10-04 NOTE — Addendum Note (Signed)
Addended byCoralie Keens on: 10/04/2019 10:31 AM   Modules accepted: Orders

## 2019-10-26 ENCOUNTER — Other Ambulatory Visit: Payer: Self-pay

## 2019-10-26 ENCOUNTER — Ambulatory Visit: Payer: Medicare Other | Admitting: Physician Assistant

## 2019-10-26 ENCOUNTER — Ambulatory Visit (INDEPENDENT_AMBULATORY_CARE_PROVIDER_SITE_OTHER): Payer: Medicare Other | Admitting: Physician Assistant

## 2019-10-26 ENCOUNTER — Encounter: Payer: Self-pay | Admitting: Physician Assistant

## 2019-10-26 VITALS — BP 115/70 | HR 69 | Temp 97.5°F | Ht 73.0 in | Wt 202.6 lb

## 2019-10-26 DIAGNOSIS — I2583 Coronary atherosclerosis due to lipid rich plaque: Secondary | ICD-10-CM | POA: Diagnosis not present

## 2019-10-26 DIAGNOSIS — R0609 Other forms of dyspnea: Secondary | ICD-10-CM

## 2019-10-26 DIAGNOSIS — R0600 Dyspnea, unspecified: Secondary | ICD-10-CM

## 2019-10-26 DIAGNOSIS — I2510 Atherosclerotic heart disease of native coronary artery without angina pectoris: Secondary | ICD-10-CM | POA: Diagnosis not present

## 2019-10-26 DIAGNOSIS — E785 Hyperlipidemia, unspecified: Secondary | ICD-10-CM | POA: Diagnosis not present

## 2019-10-26 DIAGNOSIS — I10 Essential (primary) hypertension: Secondary | ICD-10-CM | POA: Diagnosis not present

## 2019-10-26 DIAGNOSIS — Z79899 Other long term (current) drug therapy: Secondary | ICD-10-CM

## 2019-10-26 MED ORDER — LISINOPRIL 20 MG PO TABS
20.0000 mg | ORAL_TABLET | Freq: Every day | ORAL | 3 refills | Status: DC
Start: 2019-10-26 — End: 2019-11-02

## 2019-10-26 NOTE — Progress Notes (Signed)
Cardiology Office Note:    Date:  10/28/2019   ID:  [name], DOB 1951/01/06, MRN [id number]  PCP:  Mayra Neer, MD  [facility] Cardiologist:  Pixie Casino, MD  [place] Electrophysiologist:  None   Referring MD: Mayra Neer, MD   Chief Complaint  Patient presents with  . Follow-up    seen for Dr. Debara Pickett    History of Present Illness:    Charles Oconnor is a 69 y.o. male with a hx of hypertension, hyperlipidemia and CAD.  She had liver enzyme abnormality before on Zocor and switched to Pravachol.  She previously had moderate disease noted on cardiac catheterization in October 2016 after a false positive Myoview.  Last echocardiogram obtained on 03/31/2019 showed EF 55 to 60%, mild MR, mild dilatation of the ascending aorta measuring at 39 mm.  Patient had coronary CT in December 2020 that showed a coronary calcium score of 1119 which placed the patient at [place] percentile for age and the sex matched control.  There was heavy calcification in the RCA with possible severe stenosis, mid LAD was also calcified.  Although FFR was negative.  Due to persistent dyspnea on exertion, patient underwent pulmonology evaluation.  PFT was mildly abnormal with minimal diffusion impairment.  CT was negative for pulmonary fibrosis.  He was also placed on Imdur due to concern of falsely negative FFR.  He underwent repeat left and right heart cath on 09/27/2019 which showed 40% ostial D1, 40% proximal left circumflex, 40% proximal RCA, 30% mid LAD lesion.  Cardiac output 5.1, cardiac index 2.4.  LVEF 60 to 65% without any wall motion abnormality.  Normal hemodynamic with wedge pressure 13, small but prominent V wave in wedge tracing suggestive of either mild MR or diastolic dysfunction.  Medical therapy was recommended.  It was also recommended to consider cardiopulmonary stress test if dyspnea persist.  Postprocedure, he was seen by Dr. Vaughan Browner who recommended proceeding with cardiopulmonary  exercise test.  Dr. Vaughan Browner was also considering the possibility that his elevated hemidiaphragm was causing restricted lung process during exercise.  Patient presents today for cardiology office visit.  We discussed the recent work-up.  He says he has been having increasing shortness of breath for a while now.  He especially feel more short of breath when the weather is hot. He also has some low potassium level, I recommended switching his lisinopril-hydrochlorothiazide to lisinopril by itself 20 mg daily.  He will proceed with cardiopulmonary stress test in July before follow-up with Dr. Vaughan Browner.  He can follow-up with Dr. Debara Pickett in 3 months.  Otherwise, he appears to be euvolemic on exam.  Past Medical History:  Diagnosis Date  . Cervical spinal stenosis   . Complication of anesthesia   . Coronary artery disease    mild to moderate  . Dyslipidemia   . ED (erectile dysfunction)   . Hypertension   . PONV (postoperative nausea and vomiting)    Happened once 30 yrs ago. No problems since.  . Prostate cancer [facility])     Past Surgical History:  Procedure Laterality Date  . ANTERIOR CERVICAL DECOMPRESSION/DISCECTOMY FUSION 4 LEVELS N/A 09/22/2017   Procedure: ACDF - C3-C4 - C4-C5 - C5-C6 - C6-C7;  Surgeon: Earnie Larsson, MD;  Location: [place];  Service: Neurosurgery;  Laterality: N/A;  . APPENDECTOMY  1985  . CARDIAC CATHETERIZATION  2002, 2011   mild-mod CAD  . CARDIAC CATHETERIZATION N/A 03/12/2015   Procedure: Left Heart Cath and Coronary Angiography;  Surgeon: Pixie Casino, MD;  Location: [place] [facility];  Service: Cardiovascular;  Laterality: N/A;  . COLON RESECTION N/A 02/22/2019   Procedure: LAPAROSCOPIC COLON RESECTION AND DIVERTICULECTOMY;  Surgeon: Kieth Brightly, Arta Bruce, MD;  Location: [facility];  Service: General;  Laterality: N/A;  . HAND SURGERY Left    had joints fused  . LAPAROSCOPIC LYSIS OF ADHESIONS N/A 02/22/2019   Procedure: LAPAROSCOPIC LYSIS OF ADHESIONS;  Surgeon:  Kieth Brightly Arta Bruce, MD;  Location: [facility];  Service: General;  Laterality: N/A;  . PROSTATECTOMY  03/2007  . RIGHT/LEFT HEART CATH AND CORONARY ANGIOGRAPHY N/A 09/27/2019   Procedure: RIGHT/LEFT HEART CATH AND CORONARY ANGIOGRAPHY;  Surgeon: Jolaine Artist, MD;  Location: [place] [facility];  Service: Cardiovascular;  Laterality: N/A;  . TOTAL KNEE ARTHROPLASTY  04/2009    Current Medications: Current Meds  Medication Sig  . amLODipine (NORVASC) 10 MG tablet Take 10 mg by mouth daily.  Marland Kitchen aspirin EC 81 MG tablet Take 81 mg by mouth daily.  . isosorbide mononitrate (IMDUR) 30 MG 24 hr tablet Take 1 tablet (30 mg total) by mouth daily.  . Multiple Vitamin (MULTIVITAMIN WITH MINERALS) TABS tablet Take 1 tablet by mouth daily.  . pantoprazole (PROTONIX) 40 MG tablet Take 40 mg by mouth daily.   . [DISCONTINUED] lisinopril-hydrochlorothiazide (PRINZIDE,ZESTORETIC) 20-12.5 MG per tablet Take 2 tablets by mouth daily.      Allergies:   Zocor [simvastatin]   Social History   Socioeconomic History  . Marital status: Married    Spouse name: Not on file  . Number of children: 1  . Years of education: Not on file  . Highest education level: Not on file  Occupational History  . Occupation: retired Chemical engineer: UNEMPLOYED  Tobacco Use  . Smoking status: Never Smoker  . Smokeless tobacco: Never Used  Vaping Use  . Vaping Use: Never used  Substance and Sexual Activity  . Alcohol use: No  . Drug use: No  . Sexual activity: Not on file  Other Topics Concern  . Not on file  Social History Narrative  . Not on file   Social Determinants of Health   Financial Resource Strain:   . Difficulty of Paying Living Expenses:   Food Insecurity:   . Worried About Charity fundraiser in the Last Year:   . Arboriculturist in the Last Year:   Transportation Needs:   . Film/video editor (Medical):   Marland Kitchen Lack of Transportation (Non-Medical):   Physical Activity:   . Days of  Exercise per Week:   . Minutes of Exercise per Session:   Stress:   . Feeling of Stress :   Social Connections:   . Frequency of Communication with Friends and Family:   . Frequency of Social Gatherings with Friends and Family:   . Attends Religious Services:   . Active Member of Clubs or Organizations:   . Attends Archivist Meetings:   Marland Kitchen Marital Status:      Family History: The patient's family history includes Cancer in his brother; Coronary artery disease in his mother; Diabetes in his mother; Heart attack in his brother, brother, and father; Heart failure in his mother; Prostate cancer in his brother; Stroke in his mother.  ROS:   Please see the history of present illness.     All other systems reviewed and are negative.  EKGs/Labs/Other Studies Reviewed:    The following studies were reviewed today:  Echo 03/31/2019 1. Left ventricular ejection fraction, by visual estimation, is 55 to  60%. The left ventricle has normal function. There is no left ventricular  hypertrophy.  2. Global right ventricle has normal systolic function.The right  ventricular size is normal.  3. Left atrial size was normal.  4. Right atrial size was normal.  5. The mitral valve is normal in structure. Mild mitral valve  regurgitation. No evidence of mitral stenosis.  6. The tricuspid valve is normal in structure. Tricuspid valve  regurgitation is trivial.  7. The aortic valve is tricuspid. Aortic valve regurgitation is trivial.  Mild aortic valve sclerosis without stenosis.  8. The pulmonic valve was normal in structure. Pulmonic valve  regurgitation is trivial.  9. Aortic dilatation noted.  10. There is mild dilatation of the ascending aorta measuring 39 mm.  11. Mildly elevated pulmonary artery systolic pressure.  12. The inferior vena cava is normal in size with greater than 50%  respiratory variability, suggesting right atrial pressure of 3 mmHg.  13. Normal LV systolic  function; mildly dilated ascending aorta; trace AI;  mild MR.    Cath 09/27/2019  Ost 1st Diag to 1st Diag lesion is 40% stenosed.  Prox Cx to Dist Cx lesion is 40% stenosed.  Prox RCA lesion is 40% stenosed.  Mid LAD lesion is 30% stenosed.   Findings:  Ao = 107/60 (80) LV = 107/11 RA = 5 RV = 23/5 PA = 22/4 (11) PCW = 13 Fick cardiac output/index = 5.1/2.4 PVR = < 1.0 WU FA sat = 98%  PA sat = 75%, 79%  SVC sat = 72%  Assessment: 1. Mild non-obstructive CAD - no change from 2016 2. LVEF 60-65% - no regional wall motion abnormalities 3. Nomal hemodynamics with small but prominent v-wave in wedge tracing suggestive of either mild MR or diastolic dysfunction  Plan/Discussion:  Medical therapy. Consider CPX testing if dyspnea persists.    EKG:  EKG is not ordered today.    Recent Labs: 02/24/2019: ALT 20 09/22/2019: BUN 16; Creatinine, Ser 1.27; Platelets 248 09/27/2019: Hemoglobin 10.9; Potassium 3.1; Sodium 147  Recent Lipid Panel    Component Value Date/Time   CHOL 125 08/03/2019 0921   CHOL 157 01/27/2014 1352   TRIG 137 08/03/2019 0921   TRIG 125 01/27/2014 1352   HDL 40 08/03/2019 0921   HDL 48 01/27/2014 1352   CHOLHDL 3.1 08/03/2019 0921   CHOLHDL 3.1 01/05/2010 0858   VLDL 20 01/05/2010 0858   LDLCALC 61 08/03/2019 0921   LDLCALC 84 01/27/2014 1352    Physical Exam:    VS:  BP 115/70   Pulse 69   Temp (!) 97.5 F (36.4 C)   Ht 6\' 1"  (1.854 m)   Wt 202 lb 9.6 oz (91.9 kg)   SpO2 94%   BMI 26.73 kg/m     Wt Readings from Last 3 Encounters:  10/26/19 202 lb 9.6 oz (91.9 kg)  10/04/19 203 lb (92.1 kg)  09/27/19 195 lb (88.5 kg)     GEN:  Well nourished, well developed in no acute distress HEENT: Normal NECK: No JVD; No carotid bruits LYMPHATICS: No lymphadenopathy CARDIAC: RRR, no murmurs, rubs, gallops RESPIRATORY:  Clear to auscultation without rales, wheezing or rhonchi  ABDOMEN: Soft, non-tender,  non-distended MUSCULOSKELETAL:  No edema; No deformity  SKIN: Warm and dry NEUROLOGIC:  Alert and oriented x 3 PSYCHIATRIC:  Normal affect   ASSESSMENT:    1. DOE (dyspnea on exertion)  2. Essential hypertension   3. Medication management   4. Coronary artery disease involving native coronary artery of native heart without angina pectoris   5. Hyperlipidemia LDL goal <70    PLAN:    In order of problems listed above:  1. Dyspnea on exertion: Symptom has been going on for at least 6 months.  CT was negative for pulmonary fibrosis.  Pulmonary function test only showed minimal diffusion defect.  Recent left and right heart cath showed nonobstructive disease with normal right heart pressure.  He is planning to proceed with cardiopulmonary stress test.  Furthermore, he says he especially feel more short of breath when doing activity when the weather is hot.  I decided to remove his hydrochlorothiazide and leave him on lisinopril by itself  2. CAD: Nonobstructive disease noted on recent left and right heart cath.  Right chamber pressure was normal.  3. Hypertension: Remove hydrochlorothiazide, I prescribed 20 mg daily of lisinopril  4. Hyperlipidemia: Continue statin therapy   Medication Adjustments/Labs and Tests Ordered: Current medicines are reviewed at length with the patient today.  Concerns regarding medicines are outlined above.  Orders Placed This Encounter  Procedures  . Basic metabolic panel   Meds ordered this encounter  Medications  . lisinopril (ZESTRIL) 20 MG tablet    Sig: Take 1 tablet (20 mg total) by mouth daily.    Dispense:  90 tablet    Refill:  3    Patient Instructions  Medication Instructions:  STOP LISINOPRIL-HCTZ  START LISINOPRIL 20MG  DAILY *If you need a refill on your cardiac medications before your next appointment, please call your pharmacy*  Lab Work: BMET IN 2 WEEKS-THIS IS NOT FASTING If you have labs (blood work) drawn today and your  tests are completely normal, you will receive your results only by:  [place] (if you have MyChart) OR A paper copy in the mail.  If you have any lab test that is abnormal or we need to change your treatment, we will call you to review the results. You may go to any [facility] that is convenient for you however, we do have a lab in our office that is able to assist you. You DO NOT need an appointment for our lab. The lab is open 8:00am and closes at 4:00pm. Lunch 12:45 - 1:45pm.  Follow-Up: Your next appointment:  3 month(s) t In Person with K. Mali Hilty, MD  At [facility], you and your health needs are our priority.  As part of our continuing mission to provide you with exceptional heart care, we have created designated Provider Care Teams.  These Care Teams include your primary Cardiologist (physician) and Advanced Practice Providers (APPs -  Physician Assistants and Nurse Practitioners) who all work together to provide you with the care you need, when you need it.  We recommend signing up for the patient portal called "MyChart".  Sign up information is provided on this After Visit Summary.  MyChart is used to connect with patients for Virtual Visits (Telemedicine).  Patients are able to view lab/test results, encounter notes, upcoming appointments, etc.  Non-urgent messages can be sent to your provider as well.   To learn more about what you can do with MyChart, go to [email or web address].       Hilbert Corrigan, [place]  10/28/2019 11:11 PM    [facility]

## 2019-10-26 NOTE — Patient Instructions (Signed)
Medication Instructions:  STOP LISINOPRIL-HCTZ  START LISINOPRIL 20MG  DAILY *If you need a refill on your cardiac medications before your next appointment, please call your pharmacy*  Lab Work: BMET IN 2 WEEKS-THIS IS NOT FASTING If you have labs (blood work) drawn today and your tests are completely normal, you will receive your results only by:  [place] (if you have MyChart) OR A paper copy in the mail.  If you have any lab test that is abnormal or we need to change your treatment, we will call you to review the results. You may go to any [facility] that is convenient for you however, we do have a lab in our office that is able to assist you. You DO NOT need an appointment for our lab. The lab is open 8:00am and closes at 4:00pm. Lunch 12:45 - 1:45pm.  Follow-Up: Your next appointment:  3 month(s) t In Person with K. Mali Hilty, MD  At [facility], you and your health needs are our priority.  As part of our continuing mission to provide you with exceptional heart care, we have created designated Provider Care Teams.  These Care Teams include your primary Cardiologist (physician) and Advanced Practice Providers (APPs -  Physician Assistants and Nurse Practitioners) who all work together to provide you with the care you need, when you need it.  We recommend signing up for the patient portal called "MyChart".  Sign up information is provided on this After Visit Summary.  MyChart is used to connect with patients for Virtual Visits (Telemedicine).  Patients are able to view lab/test results, encounter notes, upcoming appointments, etc.  Non-urgent messages can be sent to your provider as well.   To learn more about what you can do with MyChart, go to [email or web address].

## 2019-10-28 ENCOUNTER — Encounter: Payer: Self-pay | Admitting: Physician Assistant

## 2019-11-02 ENCOUNTER — Other Ambulatory Visit: Payer: Self-pay | Admitting: Physician Assistant

## 2019-11-02 ENCOUNTER — Telehealth: Payer: Self-pay | Admitting: Physician Assistant

## 2019-11-02 MED ORDER — LISINOPRIL-HYDROCHLOROTHIAZIDE 20-12.5 MG PO TABS
2.0000 | ORAL_TABLET | Freq: Every day | ORAL | 0 refills | Status: AC
Start: 2019-11-02 — End: 2023-10-29

## 2019-11-02 NOTE — Telephone Encounter (Signed)
Called and spoke with pt, he reports that his ankles and lower legs have pitting edema. He states that Isaac Laud changed his meds a week ago and took him off his lisinopril-hctz and was started back only on the lisinopril.  He reports that his BP is up 78XBO systolically today- 478  He reports that he has had some difficulty breathing and has a stress test scheduled in July. He has had pulmonary functioning tests as well as CT of his heart and lungs.   He reports that his potassium had been low so this may be why his hctz was discontinued.  He reports that his SOB has been "about the same" and has no issues unless exerting himself. He reports that since his meds were stopped in the last 4 days he has gained 5-7 lbs. Notified I would send this message to [facility] as well as our pharmacists to advise on. Pt thankful for the call and had no other questions at this time.

## 2019-11-02 NOTE — Telephone Encounter (Signed)
Spoke to patient . He is aware of changes , but had a question about his dosage of lisinopril was reduced from previous office visit . He wanted to know if he should be taking the 20 mg vs 40 mg.   Rn  Informed patient will discuss with [facility] and contact patient.   Rn discussed [place] PA -  Per [place]  PA, hold off on previous recommendation from pharmacist  [employer] PA will contact patient and discuss the matter

## 2019-11-02 NOTE — Telephone Encounter (Signed)
Patient returned call

## 2019-11-02 NOTE — Telephone Encounter (Signed)
Pt called to follow up on conversation via mychart with [name] and [name]. Would like for someone to give him a call.

## 2019-11-02 NOTE — Telephone Encounter (Signed)
We can start triamterene/HCT (1 tablet) or spironolactone to control blood pressure and keep swelling and potassium under control.   I will recommend spironolactone 25mg  daily to add only 1 medication to therapy, improve potassium, and control swelling.  Patient should repeat blood work Artist) in 2 weeks.

## 2019-11-02 NOTE — Telephone Encounter (Signed)
I spoke with Mr. Charles Oconnor, we decided to place him back on the lisinopril-hctz 20-12.5mg  2 tablets daily. I plan to call him this Friday to followup on his leg swelling

## 2019-11-02 NOTE — Telephone Encounter (Signed)
[facility] 6/23

## 2019-11-02 NOTE — Telephone Encounter (Signed)
Called and spoke with pt from [place] encounter. See other telephone note

## 2019-11-10 LAB — BASIC METABOLIC PANEL
BUN/Creatinine Ratio: 18 (ref 10–24)
BUN: 24 mg/dL (ref 8–27)
CO2: 25 mmol/L (ref 20–29)
Calcium: 10 mg/dL (ref 8.6–10.2)
Chloride: 102 mmol/L (ref 96–106)
Creatinine, Ser: 1.34 mg/dL — ABNORMAL HIGH (ref 0.76–1.27)
GFR calc Af Amer: 62 mL/min/{1.73_m2} (ref >59)
GFR calc non Af Amer: 53 mL/min/{1.73_m2} — ABNORMAL LOW (ref >59)
Glucose: 85 mg/dL (ref 65–99)
Potassium: 3.8 mmol/L (ref 3.5–5.2)
Sodium: 144 mmol/L (ref 134–144)

## 2019-11-18 ENCOUNTER — Other Ambulatory Visit (HOSPITAL_COMMUNITY)
Admission: RE | Admit: 2019-11-18 | Discharge: 2019-11-18 | Disposition: A | Discharge status: Home / Self Care | Payer: Medicare Other | Source: Ambulatory Visit | Attending: Pulmonary Disease | Admitting: Pulmonary Disease

## 2019-11-18 DIAGNOSIS — Z01812 Encounter for preprocedural laboratory examination: Secondary | ICD-10-CM | POA: Insufficient documentation

## 2019-11-18 DIAGNOSIS — Z20822 Contact with and (suspected) exposure to covid-19: Secondary | ICD-10-CM | POA: Insufficient documentation

## 2019-11-18 LAB — SARS CORONAVIRUS 2 (TAT 6-24 HRS): SARS Coronavirus 2: NEGATIVE

## 2019-11-21 ENCOUNTER — Other Ambulatory Visit: Payer: Self-pay

## 2019-11-21 ENCOUNTER — Ambulatory Visit (HOSPITAL_COMMUNITY): Payer: Medicare Other | Attending: Pulmonary Disease

## 2019-11-21 ENCOUNTER — Other Ambulatory Visit (HOSPITAL_COMMUNITY): Payer: Self-pay | Admitting: *Deleted

## 2019-11-21 DIAGNOSIS — R0600 Dyspnea, unspecified: Secondary | ICD-10-CM | POA: Diagnosis not present

## 2019-11-24 ENCOUNTER — Encounter: Payer: Self-pay | Admitting: Pulmonary Disease

## 2019-11-24 ENCOUNTER — Ambulatory Visit (INDEPENDENT_AMBULATORY_CARE_PROVIDER_SITE_OTHER): Payer: Medicare Other | Admitting: Pulmonary Disease

## 2019-11-24 ENCOUNTER — Other Ambulatory Visit: Payer: Self-pay

## 2019-11-24 VITALS — BP 128/70 | HR 70 | Temp 98.2°F | Ht 73.0 in | Wt 202.8 lb

## 2019-11-24 DIAGNOSIS — I2510 Atherosclerotic heart disease of native coronary artery without angina pectoris: Secondary | ICD-10-CM

## 2019-11-24 DIAGNOSIS — I2583 Coronary atherosclerosis due to lipid rich plaque: Secondary | ICD-10-CM | POA: Diagnosis not present

## 2019-11-24 DIAGNOSIS — R0600 Dyspnea, unspecified: Secondary | ICD-10-CM

## 2019-11-24 NOTE — Patient Instructions (Signed)
Exercise test which shows that the elevated diaphragm and deconditioning are causing your symptoms  There are no worrisome findings. Recommend starting an exercise program when you have your knee problems taking care of  Follow-up in 1 year.

## 2019-11-24 NOTE — Progress Notes (Signed)
Charles Oconnor    [id number]    1950/10/23  Primary Care Physician:Shaw, Nathen May, MD  Referring Physician: Mayra Neer, MD [place] [address] [place],  [place] [address]  Chief complaint: Follow-up for dyspnea  HPI: 69 year old with history of hypertension, hyperlipidemia, prostate cancer Complains of increasing dyspnea on exertion since December 2020.  No symptoms at rest.  No cough, wheezing, sputum production. He had PFTs which showed very minimal diffusion impairment.  Dr. Debara Oconnor his cardiologist is considering heart catheterization as there is no evidence of pulmonary impairment  Prostate cancer diagnosed in 2008 and treated with prostatectomy.  His PSAs have been normal.  Pets: No pets Occupation: Retired Airline pilot. Exposures: No known exposures.  No mold, hot tub, Jacuzzi.  No down pillows or comforters Smoking history: Never smoker Travel history: No significant travel history Relevant family history: No significant family history of lung disease  Interim history: Here for review of cardiopulmonary exercise test.  States that dyspnea on exertion is stable.  Outpatient Encounter Medications as of 11/24/2019  Medication Sig  . amLODipine (NORVASC) 10 MG tablet Take 10 mg by mouth daily.  Marland Kitchen aspirin EC 81 MG tablet Take 81 mg by mouth daily.  Marland Kitchen lisinopril-hydrochlorothiazide (ZESTORETIC) 20-12.5 MG tablet Take 2 tablets by mouth daily.  . Multiple Vitamin (MULTIVITAMIN WITH MINERALS) TABS tablet Take 1 tablet by mouth daily.  . pantoprazole (PROTONIX) 40 MG tablet Take 40 mg by mouth daily.   . isosorbide mononitrate (IMDUR) 30 MG 24 hr tablet Take 1 tablet (30 mg total) by mouth daily.  . rosuvastatin (CRESTOR) 40 MG tablet Take 1 tablet (40 mg total) by mouth daily.   No facility-administered encounter medications on file as of 11/24/2019.   Physical Exam: Blood pressure 128/70, pulse 70, temperature 98.2 F (36.8 C), temperature source Oral,  height 6\' 1"  (1.854 m), weight 202 lb 12.8 oz (92 kg), SpO2 96 %. Gen:      No acute distress HEENT:  EOMI, sclera anicteric Neck:     No masses; no thyromegaly Lungs:    Clear to auscultation bilaterally; normal respiratory effort CV:         Regular rate and rhythm; no murmurs Abd:      + bowel sounds; soft, non-tender; no palpable masses, no distension Ext:    No edema; adequate peripheral perfusion Skin:      Warm and dry; no rash Neuro: alert and oriented x 3 Psych: normal mood and affect  Data Reviewed: Imaging: CT coronaries 04/21/2019-visualized lungs show linear scarring the right middle lobe and right lower lobe.  Elevated right hemidiaphragm. HRCT 09/19/2019-elevated right hemidiaphragm with right lung base atelectasis.  No interstitial lung disease, coronary atherosclerosis, ectatic ascending thoracic aorta. I have reviewed the images personally.  PFTs: 07/14/2019 FVC 3.17 [64%], FEV1 2.65 [72%], F/F 84, TLC 6.16 [80%], DLCO 21.23 [74%,], DLCO/VA 105% Minimal diffusion defect which corrects for alveolar volume.  Cardiac: Echocardiogram 03/31/2019-LVEF 55-60%, mildly elevated RVSP-35  Cardiac cath 09/27/2019 1. Mild non-obstructive CAD - no change from 2016 2. LVEF 60-65% - no regional wall motion abnormalities 3. Nomal hemodynamics with small but prominent v-wave in wedge tracing suggestive of either mild MR or diastolic dysfunction PA = 22/4 (11) PCW = 13 Fick cardiac output/index = 5.1/2.4 PVR = < 1.0 WU  CPX 11/21/19 Normal functional capacity, restrictive lung physiology.  Mild contribution of body habitus to dyspnea  Assessment:  Follow-up for dyspnea Cardiopulmonary exercise test  reviewed Symptoms are likely secondary to restrictive lung physiology from elevated hemidiaphragm and deconditioning, body habitus.  Overall he had normal exercise capacity.  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  There is no  evidence of interstitial lung disease on high-res CT Cardiac work-up unremarkable as noted above.  Discussed findings in detail with patient today in office.  He cannot exercise as he has a bad knee and is planning on getting a knee replacement later this year. After he gets his knees fixed we will start on an exercise program Reassured patient that there is no worrisome findings on her work-up  Follow-up 1 year.  Plan/Recommendations: Exercise program.  Follow-up in 1 year  Charles Garfinkel MD [facility] 11/24/2019, 9:03 AM  CC: Charles Neer, MD

## 2019-11-28 ENCOUNTER — Telehealth: Payer: Self-pay | Admitting: *Deleted

## 2019-11-28 NOTE — Telephone Encounter (Signed)
   [facility] Pre-operative Risk Assessment    Dr Debara Pickett   Request for surgical clearance:  1. What type of surgery is being performed?  Left total knee arthroplasty  At [facility]   2. When is this surgery scheduled? 12/19/19  3. What type of clearance is required (medical clearance vs. Pharmacy clearance to hold med vs. Both)?  Medical   4. Are there any medications that need to be held prior to surgery and how long?  Aspirin 81 mg -- 7 days prior   5. Practice name and name of physician performing surgery? [facility] ;DR MATTHEW OLIN   6. What is the office phone number?  [place]   What is the office fax number? [place]   Anesthesia type (None, local, MAC, general) ? SPINAL   Charles Oconnor 11/28/2019, 4:52 PM  _________________________________________________________________   (provider comments below)

## 2019-11-29 NOTE — Telephone Encounter (Signed)
   Primary Cardiologist: Pixie Casino, MD  Chart reviewed and patient contacted by phone as part of pre-operative protocol coverage. Given past medical history and time since last visit, based on ACC/AHA guidelines, Charles Oconnor would be at acceptable risk for the planned procedure without further cardiovascular testing.   OK to hold aspirin 5-7 days pre op and resume as soon as possible post op.   I will route this recommendation to the requesting party via Epic fax function and remove from pre-op pool.  Please call with questions.  Kerin Ransom, PA-C 11/29/2019, 9:17 AM

## 2019-12-09 NOTE — Patient Instructions (Addendum)
DUE TO COVID-19 ONLY ONE VISITOR IS ALLOWED TO COME WITH YOU AND STAY IN THE WAITING ROOM ONLY DURING PRE OP AND PROCEDURE DAY OF SURGERY. THE 2 VISITORS MAY VISIT WITH YOU AFTER SURGERY IN YOUR PRIVATE ROOM DURING VISITING HOURS ONLY!  YOU NEED TO HAVE A COVID 19 TEST ON___8/5____ @_2 :45______, THIS TEST MUST BE DONE BEFORE SURGERY COVID TESTING SITE [address] [address], IT IS ON THE RIGHT GOING OUT [address] APPROXITAMELTELY 2 MINUTES PAST [employer] ON THE RIGHT. ONCE YOUR COVID TEST IS COMPLETED,  PLEASE BEGIN THE QUARANTINE INSTRUCTIONS AS OUTLINED IN YOUR HANDOUT.                Charles Oconnor   Your procedure is scheduled on: 12/19/19   Report to [facility] Main  Entrance   Report to admitting at   9:05 AM     Call this number if you have problems the morning of surgery [place], NO CHEWING GUM [place].   No food after midnight.    You may have clear liquid until 8:00  AM.    At 8:00 AM drink pre surgery drink  . Nothing by mouth after 4:30 AM.    Take these medicines the morning of surgery with A SIP OF WATER: Imdur, Amlodipine, Protonix                                 You may not have any metal on your body including             piercings  Do not wear jewelry,  lotions, powders or deodorant              Men may shave face and neck.   Do not bring valuables to the hospital. [place].  Contacts, dentures or bridgework may not be worn into surgery.                   Please read over the following fact sheets you were given: _____________________________________________________________________             [facility] - Preparing for Surgery Before surgery, you can play an important role .  Because skin is not sterile, your skin needs to be as free of germs as possible.   You can reduce the  number of germs on your skin by washing with CHG (chlorahexidine gluconate) soap before surgery.   CHG is an antiseptic cleaner which kills germs and bonds with the skin to continue killing germs even after washing. Please DO NOT use if you have an allergy to CHG or antibacterial soaps.   If your skin becomes reddened/irritated stop using the CHG and inform your nurse when you arrive at Short Stay. .  You may shave your face/neck.  Please follow these instructions carefully:  1.  Shower with CHG Soap the night before surgery and the  morning of Surgery.  2.  If you choose to wash your hair, wash your hair first as usual with your  normal  shampoo.  3.  After you shampoo, rinse your hair and body thoroughly to remove the  shampoo.  4.  Use CHG as you would any other liquid soap.  You can apply chg directly  to the skin and wash                       Gently with a scrungie or clean washcloth.  5.  Apply the CHG Soap to your body ONLY FROM THE NECK DOWN.   Do not use on face/ open                           Wound or open sores. Avoid contact with eyes, ears mouth and genitals (private parts).                       Wash face,  Genitals (private parts) with your normal soap.             6.  Wash thoroughly, paying special attention to the area where your surgery  will be performed.  7.  Thoroughly rinse your body with warm water from the neck down.  8.  DO NOT shower/wash with your normal soap after using and rinsing off  the CHG Soap.             9.  Pat yourself dry with a clean towel.            10.  Wear clean pajamas.            11.  Place clean sheets on your bed the night of your first shower and do not  sleep with pets. Day of Surgery : Do not apply any lotions/deodorants the morning of surgery.  Please wear clean clothes to the hospital/surgery center.  FAILURE TO FOLLOW THESE INSTRUCTIONS MAY RESULT IN THE CANCELLATION OF YOUR SURGERY PATIENT  SIGNATURE_________________________________  NURSE SIGNATURE__________________________________  ________________________________________________________________________   Charles Oconnor  An incentive spirometer is a tool that can help keep your lungs clear and active. This tool measures how well you are filling your lungs with each breath. Taking long deep breaths may help reverse or decrease the chance of developing breathing (pulmonary) problems (especially infection) following:  A long period of time when you are unable to move or be active. BEFORE THE PROCEDURE   If the spirometer includes an indicator to show your best effort, your nurse or respiratory therapist will set it to a desired goal.  If possible, sit up straight or lean slightly forward. Try not to slouch.  Hold the incentive spirometer in an upright position. INSTRUCTIONS FOR USE  1. Sit on the edge of your bed if possible, or sit up as far as you can in bed or on a chair. 2. Hold the incentive spirometer in an upright position. 3. Breathe out normally. 4. Place the mouthpiece in your mouth and seal your lips tightly around it. 5. Breathe in slowly and as deeply as possible, raising the piston or the ball toward the top of the column. 6. Hold your breath for 3-5 seconds or for as long as possible. Allow the piston or ball to fall to the bottom of the column. 7. Remove the mouthpiece from your mouth and breathe out normally. 8. Rest for a few seconds and repeat Steps 1 through 7 at least 10 times every 1-2 hours when you are awake. Take your time and take a few normal breaths between deep breaths. 9. The spirometer may include an indicator to show your best effort.  Use the indicator as a goal to work toward during each repetition. 10. After each set of 10 deep breaths, practice coughing to be sure your lungs are clear. If you have an incision (the cut made at the time of surgery), support your incision when coughing  by placing a pillow or rolled up towels firmly against it. Once you are able to get out of bed, walk around indoors and cough well. You may stop using the incentive spirometer when instructed by your caregiver.  RISKS AND COMPLICATIONS  Take your time so you do not get dizzy or light-headed.  If you are in pain, you may need to take or ask for pain medication before doing incentive spirometry. It is harder to take a deep breath if you are having pain. AFTER USE  Rest and breathe slowly and easily.  It can be helpful to keep track of a log of your progress. Your caregiver can provide you with a simple table to help with this. If you are using the spirometer at home, follow these instructions: [place] IF:   You are having difficultly using the spirometer.  You have trouble using the spirometer as often as instructed.  Your pain medication is not giving enough relief while using the spirometer.  You develop fever of 100.5 F (38.1 C) or higher. SEEK IMMEDIATE MEDICAL CARE IF:   You cough up bloody sputum that had not been present before.  You develop fever of 102 F (38.9 C) or greater.  You develop worsening pain at or near the incision site. MAKE SURE YOU:   Understand these instructions.  Will watch your condition.  Will get help right away if you are not doing well or get worse. Document Released: 09/08/2006 Document Revised: 07/21/2011 Document Reviewed: 11/09/2006 [facility] Patient Information 2014 [place], [place].   ________________________________________________________________________

## 2019-12-12 ENCOUNTER — Encounter (HOSPITAL_COMMUNITY): Payer: Self-pay

## 2019-12-12 ENCOUNTER — Encounter (HOSPITAL_COMMUNITY)
Admission: RE | Admit: 2019-12-12 | Discharge: 2019-12-12 | Disposition: A | Discharge status: Home / Self Care | Payer: Medicare Other | Source: Ambulatory Visit | Attending: Orthopedic Surgery | Admitting: Orthopedic Surgery

## 2019-12-12 ENCOUNTER — Other Ambulatory Visit: Payer: Self-pay

## 2019-12-12 DIAGNOSIS — Z01812 Encounter for preprocedural laboratory examination: Secondary | ICD-10-CM | POA: Insufficient documentation

## 2019-12-12 HISTORY — DX: Unspecified osteoarthritis, unspecified site: M19.90

## 2019-12-12 HISTORY — DX: Gastro-esophageal reflux disease without esophagitis: K21.9

## 2019-12-12 HISTORY — DX: Dyspnea, unspecified: R06.00

## 2019-12-12 LAB — SURGICAL PCR SCREEN
MRSA, PCR: NEGATIVE
Staphylococcus aureus: NEGATIVE

## 2019-12-12 LAB — COMPREHENSIVE METABOLIC PANEL
ALT: 22 U/L (ref 0–44)
AST: 21 U/L (ref 15–41)
Albumin: 4.8 g/dL (ref 3.5–5.0)
Alkaline Phosphatase: 53 U/L (ref 38–126)
Anion gap: 11 (ref 5–15)
BUN: 18 mg/dL (ref 8–23)
CO2: 28 mmol/L (ref 22–32)
Calcium: 10 mg/dL (ref 8.9–10.3)
Chloride: 103 mmol/L (ref 98–111)
Creatinine, Ser: 1.26 mg/dL — ABNORMAL HIGH (ref 0.61–1.24)
GFR calc Af Amer: >60 mL/min (ref >60)
GFR calc non Af Amer: 57 mL/min — ABNORMAL LOW (ref >60)
Glucose, Bld: 116 mg/dL — ABNORMAL HIGH (ref 70–99)
Potassium: 4.5 mmol/L (ref 3.5–5.1)
Sodium: 142 mmol/L (ref 135–145)
Total Bilirubin: 1.1 mg/dL (ref 0.3–1.2)
Total Protein: 7.6 g/dL (ref 6.5–8.1)

## 2019-12-12 LAB — CBC
HCT: 41.9 % (ref 39.0–52.0)
Hemoglobin: 13.9 g/dL (ref 13.0–17.0)
MCH: 29.6 pg (ref 26.0–34.0)
MCHC: 33.2 g/dL (ref 30.0–36.0)
MCV: 89.3 fL (ref 80.0–100.0)
Platelets: 197 10*3/uL (ref 150–400)
RBC: 4.69 MIL/uL (ref 4.22–5.81)
RDW: 13.2 % (ref 11.5–15.5)
WBC: 6.1 10*3/uL (ref 4.0–10.5)
nRBC: 0 % (ref 0.0–0.2)

## 2019-12-12 LAB — PROTIME-INR
INR: 1 (ref 0.8–1.2)
Prothrombin Time: 12.8 seconds (ref 11.4–15.2)

## 2019-12-12 LAB — APTT: aPTT: 29 seconds (ref 24–36)

## 2019-12-12 NOTE — Progress Notes (Addendum)
COVID Vaccine Completed:Yes Date COVID Vaccine completed:07/02/19 COVID vaccine manufacturer: Pfizer    PCP - Dr. Brigitte Pulse Cardiologist - Dr. Debara Pickett  Chest x-ray - 09/19/19 EKG - 09/27/19 Stress Test - 11/21/19 ECHO - 03/31/19 Cardiac Cath - 2002, 11, 16 21  Sleep Study - NA CPAP -   Fasting Blood Sugar -  Checks Blood Sugar _____ times a day  Blood Thinner Instructions:ASA/ Dr. Debara Pickett Aspirin Instructions:Stop 7 days prior/[place] Last Dose:12/12/19  Anesthesia review:   Patient denies shortness of breath, fever, cough and chest pain at PAT appointment  yes   Patient verbalized understanding of instructions that were given to them at the PAT appointment. Patient was also instructed that they will need to review over the PAT instructions again at home before surgery. Yes  Pt has SOB with 2 flights of stairs or anything strenuous but not with ADLs Pt has some limited RPM with neck due to fusion .

## 2019-12-13 NOTE — Anesthesia Preprocedure Evaluation (Addendum)
Anesthesia Evaluation  Patient identified by MRN, date of birth, ID band Patient awake    Reviewed: Allergy & Precautions, NPO status , Patient's Chart, lab work & pertinent test results  History of Anesthesia Complications (+) PONV and history of anesthetic complications  Airway Mallampati: II  TM Distance: >3 FB Neck ROM: Full    Dental no notable dental hx. (+) Dental Advisory Given, Teeth Intact   Pulmonary shortness of breath,    Pulmonary exam normal breath sounds clear to auscultation       Cardiovascular hypertension, Pt. on medications + CAD  Normal cardiovascular exam Rhythm:Regular Rate:Normal  Echo 03/2019 1. Left ventricular ejection fraction, by visual estimation, is 55 to 60%. The left ventricle has normal function. There is no left ventricular hypertrophy.  2. Global right ventricle has normal systolic function.The right ventricular size is normal.  3. Left atrial size was normal.  4. Right atrial size was normal.  5. The mitral valve is normal in structure. Mild mitral valve  regurgitation. No evidence of mitral stenosis.  6. The tricuspid valve is normal in structure. Tricuspid valve regurgitation is trivial.  7. The aortic valve is tricuspid. Aortic valve regurgitation is trivial. Mild aortic valve sclerosis without stenosis.  8. The pulmonic valve was normal in structure. Pulmonic valve regurgitation is trivial.  9. Aortic dilatation noted.  10. There is mild dilatation of the ascending aorta measuring 39 mm.  11. Mildly elevated pulmonary artery systolic pressure.  12. The inferior vena cava is normal in size with greater than 50% respiratory variability, suggesting right atrial pressure of 3 mmHg.  13. Normal LV systolic function; mildly dilated ascending aorta; trace AI; mild MR.    Neuro/Psych    GI/Hepatic Neg liver ROS, GERD  ,  Endo/Other  negative endocrine ROS  Renal/GU negative Renal  ROS     Musculoskeletal  (+) Arthritis ,   Abdominal   Peds  Hematology   Anesthesia Other Findings   Reproductive/Obstetrics                                                            Anesthesia Evaluation  Patient identified by MRN, date of birth, ID band Patient awake    Reviewed: Unable to perform ROS - Chart review onlyPreop documentation limited or incomplete due to emergent nature of procedure.  History of Anesthesia Complications (+) PONV and history of anesthetic complications  Airway        Dental  (+) Dental Advisory Given   Pulmonary neg recent URI,           Cardiovascular hypertension, Pt. on medications + CAD       Neuro/Psych negative neurological ROS  negative psych ROS   GI/Hepatic Neg liver ROS, GERD  ,BOWEL OBSTRUCTION   Endo/Other  negative endocrine ROS  Renal/GU negative Renal ROS     Musculoskeletal negative musculoskeletal ROS (+)   Abdominal   Peds  Hematology negative hematology ROS (+)   Anesthesia Other Findings   Reproductive/Obstetrics                            Anesthesia Physical Anesthesia Plan  ASA: II and emergent  Anesthesia Plan: General   Post-op Pain Management:    Induction: Intravenous,  Cricoid pressure planned and Rapid sequence  PONV Risk Score and Plan: 3 and Ondansetron and Dexamethasone  Airway Management Planned: Oral ETT  Additional Equipment: None  Intra-op Plan:   Post-operative Plan: Extubation in OR  Informed Consent: I have reviewed the patients History and Physical, chart, labs and discussed the procedure including the risks, benefits and alternatives for the proposed anesthesia with the patient or authorized representative who has indicated his/her understanding and acceptance.     Dental advisory given  Plan Discussed with: CRNA and Surgeon  Anesthesia Plan Comments:         Anesthesia Quick  Evaluation  Anesthesia Physical Anesthesia Plan  ASA: III  Anesthesia Plan: Spinal   Post-op Pain Management:  Regional for Post-op pain   Induction: Intravenous  PONV Risk Score and Plan: 3 and Ondansetron, Dexamethasone and Treatment may vary due to age or medical condition  Airway Management Planned: Natural Airway  Additional Equipment: None  Intra-op Plan:   Post-operative Plan:   Informed Consent: I have reviewed the patients History and Physical, chart, labs and discussed the procedure including the risks, benefits and alternatives for the proposed anesthesia with the patient or authorized representative who has indicated his/her understanding and acceptance.     Dental advisory given  Plan Discussed with: CRNA  Anesthesia Plan Comments: (See PAT note 12/12/2019, Konrad Felix, PA-C)      Anesthesia Quick Evaluation

## 2019-12-13 NOTE — Progress Notes (Signed)
Anesthesia Chart Review   Case: [id number] Date/Time: 12/19/19 1120   Procedure: TOTAL KNEE ARTHROPLASTY (Left Knee) - 50 mins   Anesthesia type: Spinal   Pre-op diagnosis: Left knee osteoarthritis   [facility] ROOM 09 / [facility]   Surgeons: Gaynelle Arabian, MD      DISCUSSION:69 y.o. never smoker with h/o PONV, HTN, GERD, mild non-obstructive CAD on cath 09/2019, left knee OA scheduled for above procedure 12/19/2019 with Dr. Gaynelle Arabian.   Pt evaluated by cardiology and pulmonology.  Cardiac Cath 09/2019 with mild non-obstructive CAD.  Exercise test 11/21/2019 with no indication for cardiopulmonary limitation.  Per pulmonologist, Dr. Vaughan Browner, "Symptoms are likely secondary to restrictive lung physiology from elevated hemidiaphragm and deconditioning, body habitus.  Overall he had normal exercise capacity.  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  There is no evidence of interstitial lung disease on high-res CT Cardiac work-up unremarkable as noted above. Discussed findings in detail with patient today in office.  He cannot exercise as he has a bad knee and is planning on getting a knee replacement later this year. After he gets his knees fixed we will start on an exercise program Reassured patient that there is no worrisome findings on her work-up"   Per cardiology preoperative risk assessment 11/29/2019, "Chart reviewed and patient contacted by phone as part of pre-operative protocol coverage. Given past medical history and time since last visit, based on ACC/AHA guidelines, Charles Oconnor would be at acceptable risk for the planned procedure without further cardiovascular testing.  OK to hold aspirin 5-7 days pre op and resume as soon as possible post op."  Anticipate pt can proceed with planned procedure barring acute status change.   VS: BP 122/75   Pulse 56   Temp 36.8 C (Oral)   Resp 16   Ht 6\' 1"  (1.854 m)   Wt (!) 92.1 kg   SpO2 97%    BMI 26.78 kg/m   PROVIDERS: Mayra Neer, MD is PCP   Lyman Bishop, MD is Cardiologist  LABS: Labs reviewed: Acceptable for surgery. (all labs ordered are listed, but only abnormal results are displayed)  Labs Reviewed  COMPREHENSIVE METABOLIC PANEL - Abnormal; Notable for the following components:      Result Value   Glucose, Bld 116 (*)    Creatinine, Ser 1.26 (*)    GFR calc non Af Amer 57 (*)    All other components within normal limits  SURGICAL PCR SCREEN  APTT  CBC  PROTIME-INR  TYPE AND SCREEN     IMAGES:   EKG: 09/27/2019 Rate 61 bpm  NSR  CV: Cardiopulmonary Exercise Test 11/21/2019 Conclusion: Exercise testing with gas exchange demonstrates normal functional capacity when compared to matched sedentary norms. There is no indication for cardiopulmonary limitation. Pre-exercise spirometry is indicative of restrictive lung physiology. He approached the limit of ventilatory reserve indicating some contribution of restrictive physiology to symptoms of dyspnea. There is also a mild contribution of body habitus that is likely playing a role in exercise intolerance. Elevated slope is likely explained with hyperventilation at peak exercise.   Cardiac Cath 09/27/2019 Assessment: 1. Mild non-obstructive CAD - no change from 2016 2. LVEF 60-65% - no regional wall motion abnormalities 3. Nomal hemodynamics with small but prominent v-wave in wedge tracing suggestive of either mild MR or diastolic dysfunction  Echo 03/31/2019 IMPRESSIONS    1. Left ventricular ejection fraction, by visual estimation, is 55 to  60%. The left ventricle has  normal function. There is no left ventricular  hypertrophy.  2. Global right ventricle has normal systolic function.The right  ventricular size is normal.  3. Left atrial size was normal.  4. Right atrial size was normal.  5. The mitral valve is normal in structure. Mild mitral valve  regurgitation. No evidence of mitral  stenosis.  6. The tricuspid valve is normal in structure. Tricuspid valve  regurgitation is trivial.  7. The aortic valve is tricuspid. Aortic valve regurgitation is trivial.  Mild aortic valve sclerosis without stenosis.  8. The pulmonic valve was normal in structure. Pulmonic valve  regurgitation is trivial.  9. Aortic dilatation noted.  10. There is mild dilatation of the ascending aorta measuring 39 mm.  11. Mildly elevated pulmonary artery systolic pressure.  12. The inferior vena cava is normal in size with greater than 50%  respiratory variability, suggesting right atrial pressure of 3 mmHg.  13. Normal LV systolic function; mildly dilated ascending aorta; trace AI;  mild MR.  Past Medical History:  Diagnosis Date  . Arthritis    Back knees  . Cervical spinal stenosis   . Complication of anesthesia   . Coronary artery disease    mild to moderate  . Dyslipidemia   . Dyspnea   . ED (erectile dysfunction)   . GERD (gastroesophageal reflux disease)   . Hypertension   . PONV (postoperative nausea and vomiting)    Happened once 30 yrs ago. No problems since.  . Prostate cancer [facility])     Past Surgical History:  Procedure Laterality Date  . ANTERIOR CERVICAL DECOMPRESSION/DISCECTOMY FUSION 4 LEVELS N/A 09/22/2017   Procedure: ACDF - C3-C4 - C4-C5 - C5-C6 - C6-C7;  Surgeon: Earnie Larsson, MD;  Location: [place];  Service: Neurosurgery;  Laterality: N/A;  . APPENDECTOMY  1985  . CARDIAC CATHETERIZATION  2002, 2011   mild-mod CAD  . CARDIAC CATHETERIZATION N/A 03/12/2015   Procedure: Left Heart Cath and Coronary Angiography;  Surgeon: Pixie Casino, MD;  Location: [place] CV LAB;  Service: Cardiovascular;  Laterality: N/A;  . COLON RESECTION N/A 02/22/2019   Procedure: LAPAROSCOPIC COLON RESECTION AND DIVERTICULECTOMY;  Surgeon: Kieth Brightly, Arta Bruce, MD;  [facility];  Service: General;  Laterality: N/A;  . HAND SURGERY Left    had joints fused  . JOINT REPLACEMENT  Right 04/11/2009  . LAPAROSCOPIC LYSIS OF ADHESIONS N/A 02/22/2019   Procedure: LAPAROSCOPIC LYSIS OF ADHESIONS;  Surgeon: Kieth Brightly Arta Bruce, MD;  [facility];  Service: General;  Laterality: N/A;  . PROSTATECTOMY  03/2007  . RIGHT/LEFT HEART CATH AND CORONARY ANGIOGRAPHY N/A 09/27/2019   Procedure: RIGHT/LEFT HEART CATH AND CORONARY ANGIOGRAPHY;  Surgeon: Jolaine Artist, MD;  Location: [place] [facility];  Service: Cardiovascular;  Laterality: N/A;  . TOTAL KNEE ARTHROPLASTY  04/2009    MEDICATIONS: . amLODipine (NORVASC) 10 MG tablet  . aspirin EC 81 MG tablet  . isosorbide mononitrate (IMDUR) 30 MG 24 hr tablet  . lisinopril-hydrochlorothiazide (ZESTORETIC) 20-12.5 MG tablet  . Multiple Vitamin (MULTIVITAMIN WITH MINERALS) TABS tablet  . pantoprazole (PROTONIX) 40 MG tablet  . rosuvastatin (CRESTOR) 40 MG tablet   No current facility-administered medications for this encounter.    Konrad Felix, PA-C [facility]-Surgical Testing [phone]

## 2019-12-14 NOTE — H&P (Signed)
TOTAL KNEE ADMISSION H&P  Patient is being admitted for left total knee arthroplasty.  Subjective:  Chief Complaint: Left knee pain.  HPI: Charles Oconnor, 69 y.o. male has a history of pain and functional disability in the left knee due to arthritis and has failed non-surgical conservative treatments for greater than 12 weeks to include corticosteriod injections, viscosupplementation injections and activity modification. Onset of symptoms was gradual, starting 8 years ago with gradually worsening course since that time. The patient noted prior procedures on the knee to include  arthroscopy and menisectomy on the left knee.  Patient currently rates pain in the left knee at 8 out of 10 with activity. Patient has worsening of pain with activity and weight bearing, pain that interferes with activities of daily living, crepitus and instability. Patient has evidence of significant medial and patellofemoral narrowing, not fully bone-on-bone by imaging studies. There is no active infection.  Patient Active Problem List   Diagnosis Date Noted  . Small bowel obstruction ([place]) 02/23/2019  . Respiratory distress following surgery 09/24/2017  . Cervical spondylosis with radiculopathy 09/22/2017  . Viral URI with cough 04/01/2016  . Chest pain, unspecified   . HTN (hypertension) 01/13/2013  . Dyslipidemia 01/13/2013  . CAD (coronary artery disease) 01/13/2013    Past Medical History:  Diagnosis Date  . Arthritis    Back knees  . Cervical spinal stenosis   . Complication of anesthesia   . Coronary artery disease    mild to moderate  . Dyslipidemia   . Dyspnea   . ED (erectile dysfunction)   . GERD (gastroesophageal reflux disease)   . Hypertension   . PONV (postoperative nausea and vomiting)    Happened once 30 yrs ago. No problems since.  . Prostate cancer [facility])     Past Surgical History:  Procedure Laterality Date  . ANTERIOR CERVICAL DECOMPRESSION/DISCECTOMY FUSION 4 LEVELS N/A  09/22/2017   Procedure: ACDF - C3-C4 - C4-C5 - C5-C6 - C6-C7;  Surgeon: Earnie Larsson, MD;  Location: [facility];  Service: Neurosurgery;  Laterality: N/A;  . APPENDECTOMY  1985  . CARDIAC CATHETERIZATION  2002, 2011   mild-mod CAD  . CARDIAC CATHETERIZATION N/A 03/12/2015   Procedure: Left Heart Cath and Coronary Angiography;  Surgeon: Pixie Casino, MD;  Location: [place] [facility];  Service: Cardiovascular;  Laterality: N/A;  . COLON RESECTION N/A 02/22/2019   Procedure: LAPAROSCOPIC COLON RESECTION AND DIVERTICULECTOMY;  Surgeon: Kieth Brightly, Arta Bruce, MD;  Location: [facility];  Service: General;  Laterality: N/A;  . HAND SURGERY Left    had joints fused  . JOINT REPLACEMENT Right 04/11/2009  . LAPAROSCOPIC LYSIS OF ADHESIONS N/A 02/22/2019   Procedure: LAPAROSCOPIC LYSIS OF ADHESIONS;  Surgeon: Kieth Brightly Arta Bruce, MD;  Location: [facility];  Service: General;  Laterality: N/A;  . PROSTATECTOMY  03/2007  . RIGHT/LEFT HEART CATH AND CORONARY ANGIOGRAPHY N/A 09/27/2019   Procedure: RIGHT/LEFT HEART CATH AND CORONARY ANGIOGRAPHY;  Surgeon: Jolaine Artist, MD;  Location: [place] [facility];  Service: Cardiovascular;  Laterality: N/A;  . TOTAL KNEE ARTHROPLASTY  04/2009    Prior to Admission medications   Medication Sig Start Date End Date Taking? Authorizing Provider  amLODipine (NORVASC) 10 MG tablet Take 10 mg by mouth daily.   Yes [provider]  aspirin EC 81 MG tablet Take 81 mg by mouth daily.   Yes [provider]  isosorbide mononitrate (IMDUR) 30 MG 24 hr tablet Take 1 tablet (30 mg total) by mouth  daily. 08/10/19 11/29/19 Yes Hilty, Nadean Corwin, MD  lisinopril-hydrochlorothiazide (ZESTORETIC) 20-12.5 MG tablet Take 2 tablets by mouth daily. 11/02/19 11/01/20 Yes [name], PA  Multiple Vitamin (MULTIVITAMIN WITH MINERALS) TABS tablet Take 1 tablet by mouth daily.   Yes [provider]  pantoprazole (PROTONIX) 40 MG tablet Take 40 mg by mouth daily.    Yes  [provider]  rosuvastatin (CRESTOR) 40 MG tablet Take 1 tablet (40 mg total) by mouth daily. 05/10/19 11/29/19 Yes Hilty, Nadean Corwin, MD    Allergies  Allergen Reactions  . Zocor [Simvastatin] Nausea Only and Other (See Comments)    Elevate liver enzymes    Social History   Socioeconomic History  . Marital status: Married    Spouse name: Not on file  . Number of children: 1  . Years of education: Not on file  . Highest education level: Not on file  Occupational History  . Occupation: retired Chemical engineer: UNEMPLOYED  Tobacco Use  . Smoking status: Never Smoker  . Smokeless tobacco: Never Used  Vaping Use  . Vaping Use: Never used  Substance and Sexual Activity  . Alcohol use: No  . Drug use: No  . Sexual activity: Not on file  Other Topics Concern  . Not on file  Social History Narrative  . Not on file   Social Determinants of Health   Financial Resource Strain:   . Difficulty of Paying Living Expenses:   Food Insecurity:   . Worried About Charity fundraiser in the Last Year:   . Arboriculturist in the Last Year:   Transportation Needs:   . Film/video editor (Medical):   Marland Kitchen Lack of Transportation (Non-Medical):   Physical Activity:   . Days of Exercise per Week:   . Minutes of Exercise per Session:   Stress:   . Feeling of Stress :   Social Connections:   . Frequency of Communication with Friends and Family:   . Frequency of Social Gatherings with Friends and Family:   . Attends Religious Services:   . Active Member of Clubs or Organizations:   . Attends Archivist Meetings:   Marland Kitchen Marital Status:   Intimate Partner Violence:   . Fear of Current or Ex-Partner:   . Emotionally Abused:   Marland Kitchen Physically Abused:   . Sexually Abused:       Tobacco Use: Low Risk   . Smoking Tobacco Use: Never Smoker  . Smokeless Tobacco Use: Never Used   Social History   Substance and Sexual Activity  Alcohol Use No    Family History   Problem Relation Age of Onset  . Heart attack Father   . Diabetes Mother   . Heart failure Mother   . Coronary artery disease Mother   . Stroke Mother   . Prostate cancer Brother   . Heart attack Brother        also CVA  . Heart attack Brother        also CVA  . Cancer Brother     Review of Systems  Constitutional: Negative for chills and fever.  HENT: Negative for congestion, sore throat and tinnitus.   Eyes: Negative for double vision, photophobia and pain.  Respiratory: Negative for cough, shortness of breath and wheezing.   Cardiovascular: Negative for chest pain, palpitations and orthopnea.  Gastrointestinal: Negative for heartburn, nausea and vomiting.  Genitourinary: Negative for dysuria, frequency and urgency.  Musculoskeletal: Positive for joint  pain.  Neurological: Negative for dizziness, weakness and headaches.    Objective:  Physical Exam: Well nourished and well developed.  General: Alert and oriented x3, cooperative and pleasant, no acute distress.  Head: normocephalic, atraumatic, neck supple.  Eyes: EOMI.  Respiratory: breath sounds clear in all fields, no wheezing, rales, or rhonchi. Cardiovascular: Regular rate and rhythm, no murmurs, gallops or rubs.  Abdomen: non-tender to palpation and soft, normoactive bowel sounds. Musculoskeletal:  Left Knee Exam:  No effusion.  Range of motion is 0 to 130 degrees.  Slight crepitus on range of motion of the knee.  Significant medial joint line tenderness.  No lateral joint line tenderness.  Stable knee.  Calves soft and nontender. Motor function intact in LE. Strength 5/5 LE bilaterally. Neuro: Distal pulses 2+. Sensation to light touch intact in LE.  Imaging Review Plain radiographs demonstrate moderate degenerative joint disease of the left knee. The overall alignment is neutral. The bone quality appears to be adequate for age and reported activity level.  Assessment/Plan:  End stage arthritis, left  knee   The patient history, physical examination, clinical judgment of the provider and imaging studies are consistent with end stage degenerative joint disease of the left knee and total knee arthroplasty is deemed medically necessary. The treatment options including medical management, injection therapy arthroscopy and arthroplasty were discussed at length. The risks and benefits of total knee arthroplasty were presented and reviewed. The risks due to aseptic loosening, infection, stiffness, patella tracking problems, thromboembolic complications and other imponderables were discussed. The patient acknowledged the explanation, agreed to proceed with the plan and consent was signed. Patient is being admitted for inpatient treatment for surgery, pain control, PT, OT, prophylactic antibiotics, VTE prophylaxis, progressive ambulation and ADLs and discharge planning. The patient is planning to be discharged home.   Patient's anticipated LOS is less than 2 midnights, meeting these requirements: - Lives within 1 hour of care - Has a competent adult at home to recover with post-op recover - NO history of  - Chronic pain requiring opioids  - Diabetes  - Heart failure  - Heart attack  - Stroke  - DVT/VTE  - Cardiac arrhythmia  - Respiratory Failure/COPD  - Renal failure  - Anemia  - Advanced Liver disease  Therapy Plans: Outpatient therapy at [facility]) Disposition: Home with wife Planned DVT Prophylaxis: Xarelto 10 mg QD  DME Needed: None PCP: Derrill Memo, MD (clearance received) Cardiologist: Lyman Bishop, MD (clearance received) TXA: IV Allergies: Simvastatin (elevated liver enzymes) Anesthesia Concerns: None BMI: 26.7 Last HgbA1c: Not diabetic  Other: - Stop ASA 7 days prior to surgery - Hx prostate CA, CAD  - Patient was instructed on what medications to stop prior to surgery. - Follow-up visit in 2 weeks with Dr. Wynelle Link - Begin physical therapy following surgery -  Pre-operative lab work as pre-surgical testing - Prescriptions will be provided in hospital at time of discharge  Theresa Duty, PA-C Orthopedic Surgery [facility] [place]

## 2019-12-15 ENCOUNTER — Other Ambulatory Visit (HOSPITAL_COMMUNITY)
Admission: RE | Admit: 2019-12-15 | Discharge: 2019-12-15 | Disposition: A | Discharge status: Home / Self Care | Payer: Medicare Other | Source: Ambulatory Visit | Attending: Orthopedic Surgery | Admitting: Orthopedic Surgery

## 2019-12-15 DIAGNOSIS — Z01812 Encounter for preprocedural laboratory examination: Secondary | ICD-10-CM | POA: Diagnosis not present

## 2019-12-15 DIAGNOSIS — Z20822 Contact with and (suspected) exposure to covid-19: Secondary | ICD-10-CM | POA: Diagnosis not present

## 2019-12-15 LAB — SARS CORONAVIRUS 2 (TAT 6-24 HRS): SARS Coronavirus 2: NEGATIVE

## 2019-12-18 MED ORDER — BUPIVACAINE LIPOSOME 1.3 % IJ SUSP
20.0000 mL | Freq: Once | INTRAMUSCULAR | Status: DC
  Filled 2019-12-18: qty 20

## 2019-12-19 ENCOUNTER — Ambulatory Visit (HOSPITAL_COMMUNITY): Payer: Medicare Other | Admitting: Physician Assistant

## 2019-12-19 ENCOUNTER — Ambulatory Visit (HOSPITAL_COMMUNITY): Payer: Medicare Other | Admitting: Anesthesiology

## 2019-12-19 ENCOUNTER — Observation Stay (HOSPITAL_COMMUNITY)
Admission: RE | Admit: 2019-12-19 | Discharge: 2019-12-20 | Disposition: A | Discharge status: Home / Self Care | Payer: Medicare Other | Source: Other Acute Inpatient Hospital | Attending: Orthopedic Surgery | Admitting: Orthopedic Surgery

## 2019-12-19 ENCOUNTER — Encounter (HOSPITAL_COMMUNITY): Payer: Self-pay | Admitting: Orthopedic Surgery

## 2019-12-19 ENCOUNTER — Encounter (HOSPITAL_COMMUNITY)
Admission: RE | Disposition: A | Discharge status: Home / Self Care | Payer: Self-pay | Source: Other Acute Inpatient Hospital | Attending: Orthopedic Surgery

## 2019-12-19 ENCOUNTER — Other Ambulatory Visit: Payer: Self-pay

## 2019-12-19 DIAGNOSIS — I10 Essential (primary) hypertension: Secondary | ICD-10-CM | POA: Diagnosis not present

## 2019-12-19 DIAGNOSIS — I2510 Atherosclerotic heart disease of native coronary artery without angina pectoris: Secondary | ICD-10-CM | POA: Diagnosis not present

## 2019-12-19 DIAGNOSIS — M179 Osteoarthritis of knee, unspecified: Secondary | ICD-10-CM | POA: Diagnosis present

## 2019-12-19 DIAGNOSIS — Z9079 Acquired absence of other genital organ(s): Secondary | ICD-10-CM | POA: Diagnosis not present

## 2019-12-19 DIAGNOSIS — M1712 Unilateral primary osteoarthritis, left knee: Principal | ICD-10-CM | POA: Diagnosis present

## 2019-12-19 DIAGNOSIS — Z8546 Personal history of malignant neoplasm of prostate: Secondary | ICD-10-CM | POA: Insufficient documentation

## 2019-12-19 DIAGNOSIS — G8918 Other acute postprocedural pain: Secondary | ICD-10-CM | POA: Diagnosis not present

## 2019-12-19 HISTORY — PX: TOTAL KNEE ARTHROPLASTY: SHX125

## 2019-12-19 LAB — TYPE AND SCREEN
ABO/RH(D): O POS
Antibody Screen: NEGATIVE

## 2019-12-19 SURGERY — ARTHROPLASTY, KNEE, TOTAL
Anesthesia: Spinal | Site: Knee | Laterality: Left

## 2019-12-19 MED ORDER — METOCLOPRAMIDE HCL 5 MG PO TABS: 5.0000 mg | ORAL_TABLET | Freq: Three times a day (TID) | ORAL | Status: DC | PRN

## 2019-12-19 MED ORDER — POVIDONE-IODINE 10 % EX SWAB
2.0000 "application " | Freq: Once | CUTANEOUS | Status: AC
  Administered 2019-12-19: 2 via TOPICAL

## 2019-12-19 MED ORDER — ONDANSETRON HCL 4 MG/2ML IJ SOLN: 4.0000 mg | Freq: Once | INTRAMUSCULAR | Status: DC | PRN

## 2019-12-19 MED ORDER — PROPOFOL 10 MG/ML IV BOLUS
INTRAVENOUS | Status: DC | PRN
  Administered 2019-12-19: 40 mg via INTRAVENOUS

## 2019-12-19 MED ORDER — AMLODIPINE BESYLATE 10 MG PO TABS
10.0000 mg | ORAL_TABLET | Freq: Every day | ORAL | Status: DC
  Administered 2019-12-20: 10 mg via ORAL
  Filled 2019-12-19: qty 1

## 2019-12-19 MED ORDER — TRANEXAMIC ACID-NACL 1000-0.7 MG/100ML-% IV SOLN
1000.0000 mg | INTRAVENOUS | Status: AC
  Administered 2019-12-19: 1000 mg via INTRAVENOUS
  Filled 2019-12-19: qty 100

## 2019-12-19 MED ORDER — BUPIVACAINE LIPOSOME 1.3 % IJ SUSP
INTRAMUSCULAR | Status: DC | PRN
  Administered 2019-12-19: 20 mL

## 2019-12-19 MED ORDER — ACETAMINOPHEN 500 MG PO TABS
1000.0000 mg | ORAL_TABLET | Freq: Four times a day (QID) | ORAL | Status: AC
  Administered 2019-12-19 – 2019-12-20 (×4): 1000 mg via ORAL
  Filled 2019-12-19 (×4): qty 2

## 2019-12-19 MED ORDER — ORAL CARE MOUTH RINSE
15.0000 mL | Freq: Once | OROMUCOSAL | Status: AC
  Administered 2019-12-19: 15 mL via OROMUCOSAL

## 2019-12-19 MED ORDER — LACTATED RINGERS IV SOLN: INTRAVENOUS | Status: DC

## 2019-12-19 MED ORDER — POLYETHYLENE GLYCOL 3350 17 G PO PACK: 17.0000 g | PACK | Freq: Every day | ORAL | Status: DC | PRN

## 2019-12-19 MED ORDER — METHOCARBAMOL 500 MG PO TABS
500.0000 mg | ORAL_TABLET | Freq: Four times a day (QID) | ORAL | Status: DC | PRN
  Administered 2019-12-19 – 2019-12-20 (×2): 500 mg via ORAL
  Filled 2019-12-19 (×2): qty 1

## 2019-12-19 MED ORDER — ACETAMINOPHEN 10 MG/ML IV SOLN
1000.0000 mg | Freq: Four times a day (QID) | INTRAVENOUS | Status: DC
  Administered 2019-12-19: 1000 mg via INTRAVENOUS
  Filled 2019-12-19: qty 100

## 2019-12-19 MED ORDER — 0.9 % SODIUM CHLORIDE (POUR BTL) OPTIME
TOPICAL | Status: DC | PRN
  Administered 2019-12-19: 1000 mL

## 2019-12-19 MED ORDER — CHLORHEXIDINE GLUCONATE 0.12 % MT SOLN: 15.0000 mL | Freq: Once | OROMUCOSAL | Status: AC

## 2019-12-19 MED ORDER — MIDAZOLAM HCL 2 MG/2ML IJ SOLN
1.0000 mg | Freq: Once | INTRAMUSCULAR | Status: DC
  Filled 2019-12-19: qty 2

## 2019-12-19 MED ORDER — MENTHOL 3 MG MT LOZG: 1.0000 | LOZENGE | OROMUCOSAL | Status: DC | PRN

## 2019-12-19 MED ORDER — SODIUM CHLORIDE (PF) 0.9 % IJ SOLN
INTRAMUSCULAR | Status: AC
  Filled 2019-12-19: qty 50

## 2019-12-19 MED ORDER — PHENYLEPHRINE HCL-NACL 10-0.9 MG/250ML-% IV SOLN
INTRAVENOUS | Status: DC | PRN
  Administered 2019-12-19: 40 ug/min via INTRAVENOUS

## 2019-12-19 MED ORDER — ISOSORBIDE MONONITRATE ER 30 MG PO TB24
30.0000 mg | ORAL_TABLET | Freq: Every day | ORAL | Status: DC
  Administered 2019-12-20: 30 mg via ORAL
  Filled 2019-12-19: qty 1

## 2019-12-19 MED ORDER — DEXAMETHASONE SODIUM PHOSPHATE 10 MG/ML IJ SOLN: 8.0000 mg | Freq: Once | INTRAMUSCULAR | Status: DC

## 2019-12-19 MED ORDER — METHOCARBAMOL 500 MG IVPB - SIMPLE MED
500.0000 mg | Freq: Four times a day (QID) | INTRAVENOUS | Status: DC | PRN
  Filled 2019-12-19: qty 50

## 2019-12-19 MED ORDER — PHENOL 1.4 % MT LIQD: 1.0000 | OROMUCOSAL | Status: DC | PRN

## 2019-12-19 MED ORDER — DEXAMETHASONE SODIUM PHOSPHATE 10 MG/ML IJ SOLN
10.0000 mg | Freq: Once | INTRAMUSCULAR | Status: AC
  Administered 2019-12-20: 10 mg via INTRAVENOUS
  Filled 2019-12-19: qty 1

## 2019-12-19 MED ORDER — BUPIVACAINE IN DEXTROSE 0.75-8.25 % IT SOLN
INTRATHECAL | Status: DC | PRN
  Administered 2019-12-19: 1.7 mL via INTRATHECAL

## 2019-12-19 MED ORDER — PROPOFOL 500 MG/50ML IV EMUL
INTRAVENOUS | Status: DC | PRN
  Administered 2019-12-19: 100 ug/kg/min via INTRAVENOUS

## 2019-12-19 MED ORDER — MORPHINE SULFATE (PF) 2 MG/ML IV SOLN: 0.5000 mg | INTRAVENOUS | Status: DC | PRN

## 2019-12-19 MED ORDER — HYDROMORPHONE HCL 1 MG/ML IJ SOLN: 0.2500 mg | INTRAMUSCULAR | Status: DC | PRN

## 2019-12-19 MED ORDER — CEFAZOLIN SODIUM-DEXTROSE 2-4 GM/100ML-% IV SOLN
2.0000 g | Freq: Four times a day (QID) | INTRAVENOUS | Status: AC
  Administered 2019-12-19 (×2): 2 g via INTRAVENOUS
  Filled 2019-12-19 (×2): qty 100

## 2019-12-19 MED ORDER — DOCUSATE SODIUM 100 MG PO CAPS
100.0000 mg | ORAL_CAPSULE | Freq: Two times a day (BID) | ORAL | Status: DC
  Administered 2019-12-19 – 2019-12-20 (×2): 100 mg via ORAL
  Filled 2019-12-19 (×2): qty 1

## 2019-12-19 MED ORDER — FLEET ENEMA 7-19 GM/118ML RE ENEM: 1.0000 | ENEMA | Freq: Once | RECTAL | Status: DC | PRN

## 2019-12-19 MED ORDER — MEPERIDINE HCL 50 MG/ML IJ SOLN: 6.2500 mg | INTRAMUSCULAR | Status: DC | PRN

## 2019-12-19 MED ORDER — ONDANSETRON HCL 4 MG/2ML IJ SOLN
INTRAMUSCULAR | Status: DC | PRN
  Administered 2019-12-19: 4 mg via INTRAVENOUS

## 2019-12-19 MED ORDER — RIVAROXABAN 10 MG PO TABS
10.0000 mg | ORAL_TABLET | Freq: Every day | ORAL | Status: DC
  Administered 2019-12-20: 10 mg via ORAL
  Filled 2019-12-19: qty 1

## 2019-12-19 MED ORDER — SODIUM CHLORIDE (PF) 0.9 % IJ SOLN
INTRAMUSCULAR | Status: DC | PRN
  Administered 2019-12-19: 60 mL

## 2019-12-19 MED ORDER — HYDROCHLOROTHIAZIDE 25 MG PO TABS
25.0000 mg | ORAL_TABLET | Freq: Every day | ORAL | Status: DC
  Administered 2019-12-20: 25 mg via ORAL
  Filled 2019-12-19: qty 1

## 2019-12-19 MED ORDER — BISACODYL 10 MG RE SUPP: 10.0000 mg | Freq: Every day | RECTAL | Status: DC | PRN

## 2019-12-19 MED ORDER — SODIUM CHLORIDE (PF) 0.9 % IJ SOLN
INTRAMUSCULAR | Status: AC
  Filled 2019-12-19: qty 10

## 2019-12-19 MED ORDER — DIPHENHYDRAMINE HCL 12.5 MG/5ML PO ELIX: 12.5000 mg | ORAL_SOLUTION | ORAL | Status: DC | PRN

## 2019-12-19 MED ORDER — CLONIDINE HCL (ANALGESIA) 100 MCG/ML EP SOLN
EPIDURAL | Status: DC | PRN
  Administered 2019-12-19: 75 ug

## 2019-12-19 MED ORDER — TRAMADOL HCL 50 MG PO TABS: 50.0000 mg | ORAL_TABLET | Freq: Four times a day (QID) | ORAL | Status: DC | PRN

## 2019-12-19 MED ORDER — FENTANYL CITRATE (PF) 100 MCG/2ML IJ SOLN
50.0000 ug | Freq: Once | INTRAMUSCULAR | Status: AC
  Administered 2019-12-19: 100 ug via INTRAVENOUS
  Filled 2019-12-19: qty 2

## 2019-12-19 MED ORDER — CEFAZOLIN SODIUM-DEXTROSE 2-4 GM/100ML-% IV SOLN
2.0000 g | INTRAVENOUS | Status: AC
  Administered 2019-12-19: 2 g via INTRAVENOUS
  Filled 2019-12-19: qty 100

## 2019-12-19 MED ORDER — PANTOPRAZOLE SODIUM 40 MG PO TBEC
40.0000 mg | DELAYED_RELEASE_TABLET | Freq: Every day | ORAL | Status: DC
  Administered 2019-12-20: 40 mg via ORAL
  Filled 2019-12-19: qty 1

## 2019-12-19 MED ORDER — SODIUM CHLORIDE 0.9 % IV SOLN: INTRAVENOUS | Status: DC

## 2019-12-19 MED ORDER — ROSUVASTATIN CALCIUM 20 MG PO TABS
40.0000 mg | ORAL_TABLET | Freq: Every day | ORAL | Status: DC
  Administered 2019-12-19 – 2019-12-20 (×2): 40 mg via ORAL
  Filled 2019-12-19 (×2): qty 2

## 2019-12-19 MED ORDER — SODIUM CHLORIDE 0.9 % IR SOLN
Status: DC | PRN
  Administered 2019-12-19: 1000 mL

## 2019-12-19 MED ORDER — OXYCODONE HCL 5 MG PO TABS
5.0000 mg | ORAL_TABLET | ORAL | Status: DC | PRN
  Administered 2019-12-19 (×2): 5 mg via ORAL
  Administered 2019-12-19 – 2019-12-20 (×3): 10 mg via ORAL
  Filled 2019-12-19 (×3): qty 2
  Filled 2019-12-19 (×2): qty 1

## 2019-12-19 MED ORDER — ONDANSETRON HCL 4 MG PO TABS: 4.0000 mg | ORAL_TABLET | Freq: Four times a day (QID) | ORAL | Status: DC | PRN

## 2019-12-19 MED ORDER — METOCLOPRAMIDE HCL 5 MG/ML IJ SOLN: 5.0000 mg | Freq: Three times a day (TID) | INTRAMUSCULAR | Status: DC | PRN

## 2019-12-19 MED ORDER — BUPIVACAINE-EPINEPHRINE (PF) 0.5% -1:200000 IJ SOLN
INTRAMUSCULAR | Status: DC | PRN
Start: 2019-12-19 — End: 2019-12-19
  Administered 2019-12-19: 30 mL via PERINEURAL

## 2019-12-19 MED ORDER — DEXAMETHASONE SODIUM PHOSPHATE 4 MG/ML IJ SOLN
INTRAMUSCULAR | Status: DC | PRN
  Administered 2019-12-19 (×2): 5 mg via PERINEURAL

## 2019-12-19 MED ORDER — STERILE WATER FOR IRRIGATION IR SOLN
Status: DC | PRN
  Administered 2019-12-19: 2000 mL

## 2019-12-19 MED ORDER — ONDANSETRON HCL 4 MG/2ML IJ SOLN: 4.0000 mg | Freq: Four times a day (QID) | INTRAMUSCULAR | Status: DC | PRN

## 2019-12-19 SURGICAL SUPPLY — 57 items
ATTUNE MED DOME PAT 38 KNEE (Knees) ×2 IMPLANT
ATTUNE MED DOME PAT 38MM KNEE (Knees) ×1 IMPLANT
ATTUNE PS FEM LT SZ 7 CEM KNEE (Femur) ×3 IMPLANT
ATTUNE PSRP INSR SZ7 7 KNEE (Insert) ×2 IMPLANT
ATTUNE PSRP INSR SZ7 7MM KNEE (Insert) ×1 IMPLANT
BAG SPEC THK2 15X12 ZIP CLS (MISCELLANEOUS) ×1
BAG ZIPLOCK 12X15 (MISCELLANEOUS) ×3 IMPLANT
BASE TIBIAL ROT PLAT SZ 8 KNEE (Knees) ×1 IMPLANT
BLADE SAG 18X100X1.27 (BLADE) ×3 IMPLANT
BLADE SAW SGTL 11.0X1.19X90.0M (BLADE) ×3 IMPLANT
BLADE SURG SZ10 CARB STEEL (BLADE) ×6 IMPLANT
BNDG ELASTIC 6X5.8 VLCR STR LF (GAUZE/BANDAGES/DRESSINGS) ×3 IMPLANT
BOWL SMART MIX CTS (DISPOSABLE) ×3 IMPLANT
BSPLAT TIB 8 CMNT ROT PLAT STR (Knees) ×1 IMPLANT
CEMENT HV SMART SET (Cement) ×6 IMPLANT
CLOSURE WOUND 1/2 X4 (GAUZE/BANDAGES/DRESSINGS) ×2
COVER SURGICAL LIGHT HANDLE (MISCELLANEOUS) ×3 IMPLANT
COVER WAND RF STERILE (DRAPES) ×3 IMPLANT
CUFF TOURN SGL QUICK 34 (TOURNIQUET CUFF) ×3
CUFF TRNQT CYL 34X4.125X (TOURNIQUET CUFF) ×1 IMPLANT
DECANTER SPIKE VIAL GLASS SM (MISCELLANEOUS) ×3 IMPLANT
DRAPE U-SHAPE 47X51 STRL (DRAPES) ×3 IMPLANT
DRSG AQUACEL AG ADV 3.5X10 (GAUZE/BANDAGES/DRESSINGS) ×3 IMPLANT
DURAPREP 26ML APPLICATOR (WOUND CARE) ×3 IMPLANT
ELECT REM PT RETURN 15FT ADLT (MISCELLANEOUS) ×3 IMPLANT
GLOVE BIO SURGEON STRL SZ7 (GLOVE) ×3 IMPLANT
GLOVE BIO SURGEON STRL SZ8 (GLOVE) ×3 IMPLANT
GLOVE BIOGEL PI IND STRL 7.0 (GLOVE) ×1 IMPLANT
GLOVE BIOGEL PI IND STRL 8 (GLOVE) ×1 IMPLANT
GLOVE BIOGEL PI INDICATOR 7.0 (GLOVE) ×2
GLOVE BIOGEL PI INDICATOR 8 (GLOVE) ×2
GOWN STRL REUS W/TWL LRG LVL3 (GOWN DISPOSABLE) ×6 IMPLANT
HANDPIECE INTERPULSE COAX TIP (DISPOSABLE) ×3
HOLDER FOLEY CATH W/STRAP (MISCELLANEOUS) ×3 IMPLANT
IMMOBILIZER KNEE 20 (SOFTGOODS) ×3
IMMOBILIZER KNEE 20 THIGH 36 (SOFTGOODS) ×1 IMPLANT
KIT TURNOVER KIT A (KITS) IMPLANT
MANIFOLD NEPTUNE II (INSTRUMENTS) ×3 IMPLANT
NS IRRIG 1000ML POUR BTL (IV SOLUTION) ×3 IMPLANT
PACK TOTAL KNEE CUSTOM (KITS) ×3 IMPLANT
PADDING CAST COTTON 6X4 STRL (CAST SUPPLIES) ×3 IMPLANT
PENCIL SMOKE EVACUATOR (MISCELLANEOUS) ×3 IMPLANT
PIN FIX SIGMA LCS THRD HI (PIN) ×3 IMPLANT
PIN STEINMAN FIXATION KNEE (PIN) ×3 IMPLANT
PROTECTOR NERVE ULNAR (MISCELLANEOUS) ×3 IMPLANT
SET HNDPC FAN SPRY TIP SCT (DISPOSABLE) ×1 IMPLANT
STRIP CLOSURE SKIN 1/2X4 (GAUZE/BANDAGES/DRESSINGS) ×4 IMPLANT
SUT MNCRL AB 4-0 PS2 18 (SUTURE) ×3 IMPLANT
SUT STRATAFIX 0 PDS 27 VIOLET (SUTURE) ×3
SUT VIC AB 2-0 CT1 27 (SUTURE) ×9
SUT VIC AB 2-0 CT1 TAPERPNT 27 (SUTURE) ×3 IMPLANT
SUTURE STRATFX 0 PDS 27 VIOLET (SUTURE) ×1 IMPLANT
TIBIAL BASE ROT PLAT SZ 8 KNEE (Knees) ×3 IMPLANT
TRAY FOLEY MTR SLVR 16FR STAT (SET/KITS/TRAYS/PACK) ×3 IMPLANT
WATER STERILE IRR 1000ML POUR (IV SOLUTION) ×6 IMPLANT
WRAP KNEE MAXI GEL POST OP (GAUZE/BANDAGES/DRESSINGS) ×3 IMPLANT
YANKAUER SUCT BULB TIP NO VENT (SUCTIONS) ×3 IMPLANT

## 2019-12-19 NOTE — Care Plan (Signed)
Ortho Bundle Case Management Note  Patient Details  Name: Charles Oconnor MRN: [id number] Date of Birth: Oct 13, 1950  L TKA on 12-19-19 DCP:  Home with wife. Lives in a 1 story home with 1 ste. DME:  RW ordered through [facility].  Doesn't want a 3-in-1. PT:  [facility].  PT eval scheduled on 12-22-19 at 11:00 am.                    DME Arranged:  Gilford Rile rolling DME Agency:  [employer]  HH Arranged:  NA HH Agency:  NA  Additional Comments: Please contact me with any questions of if this plan should need to change.  Marianne Sofia, RN,CCM [facility]  [phone] 12/19/2019, 9:19 AM

## 2019-12-19 NOTE — Interval H&P Note (Signed)
History and Physical Interval Note:  12/19/2019 9:39 AM  Charles Oconnor  has presented today for surgery, with the diagnosis of Left knee osteoarthritis.  The various methods of treatment have been discussed with the patient and family. After consideration of risks, benefits and other options for treatment, the patient has consented to  Procedure(s) with comments: TOTAL KNEE ARTHROPLASTY (Left) - 50 mins as a surgical intervention.  The patient's history has been reviewed, patient examined, no change in status, stable for surgery.  I have reviewed the patient's chart and labs.  Questions were answered to the patient's satisfaction.     [name]

## 2019-12-19 NOTE — Op Note (Signed)
OPERATIVE REPORT-TOTAL KNEE ARTHROPLASTY   Pre-operative diagnosis- Osteoarthritis  Left knee(s)  Post-operative diagnosis- Osteoarthritis Left knee(s)  Procedure-  Left  Total Knee Arthroplasty  Surgeon- Dione Plover. Selina Tapper, MD  Assistant- Theresa Duty, PA-C   Anesthesia-  Adductor canal block and spinal  EBL- 25 ml   Drains Hemovac  Tourniquet time-  Total Tourniquet Time Documented: Thigh (Left) - 39 minutes Total: Thigh (Left) - 39 minutes     Complications- None  Condition-PACU - hemodynamically stable.   Brief Clinical Note   Charles Oconnor is a 69 y.o. year old male with end stage OA of his left knee with progressively worsening pain and dysfunction. He has constant pain, with activity and at rest and significant functional deficits with difficulties even with ADLs. He has had extensive non-op management including analgesics, injections of cortisone and viscosupplements, and home exercise program, but remains in significant pain with significant dysfunction. Radiographs show bone on bone arthritis medial and patellofemoral. He presents now for left Total Knee Arthroplasty.    Procedure in detail---   The patient is brought into the operating room and positioned supine on the operating table. After successful administration of  Adductor canal block and spinal,   a tourniquet is placed high on the  Left thigh(s) and the lower extremity is prepped and draped in the usual sterile fashion. Time out is performed by the operating team and then the  Left lower extremity is wrapped in Esmarch, knee flexed and the tourniquet inflated to 300 mmHg.       A midline incision is made with a ten blade through the subcutaneous tissue to the level of the extensor mechanism. A fresh blade is used to make a medial parapatellar arthrotomy. Soft tissue over the proximal medial tibia is subperiosteally elevated to the joint line with a knife and into the semimembranosus bursa with a Cobb  elevator. Soft tissue over the proximal lateral tibia is elevated with attention being paid to avoiding the patellar tendon on the tibial tubercle. The patella is everted, knee flexed 90 degrees and the ACL and PCL are removed. Findings are bone on bone medial and patellofemoral with large global osteophytes.        The drill is used to create a starting hole in the distal femur and the canal is thoroughly irrigated with sterile saline to remove the fatty contents. The 5 degree Left  valgus alignment guide is placed into the femoral canal and the distal femoral cutting block is pinned to remove 9 mm off the distal femur. Resection is made with an oscillating saw.      The tibia is subluxed forward and the menisci are removed. The extramedullary alignment guide is placed referencing proximally at the medial aspect of the tibial tubercle and distally along the second metatarsal axis and tibial crest. The block is pinned to remove 100mm off the more deficient medial  side. Resection is made with an oscillating saw. Size 8is the most appropriate size for the tibia and the proximal tibia is prepared with the modular drill and keel punch for that size.      The femoral sizing guide is placed and size 7 is most appropriate. Rotation is marked off the epicondylar axis and confirmed by creating a rectangular flexion gap at 90 degrees. The size 7 cutting block is pinned in this rotation and the anterior, posterior and chamfer cuts are made with the oscillating saw. The intercondylar block is then placed and that cut is  made.      Trial size 8 tibial component, trial size 7 posterior stabilized femur and a 8  mm posterior stabilized rotating platform insert trial is placed. Full extension is achieved with excellent varus/valgus and anterior/posterior balance throughout full range of motion. The patella is everted and thickness measured to be 25  mm. Free hand resection is taken to 15 mm, a 38 template is placed, lug holes  are drilled, trial patella is placed, and it tracks normally. Osteophytes are removed off the posterior femur with the trial in place. All trials are removed and the cut bone surfaces prepared with pulsatile lavage. Cement is mixed and once ready for implantation, the size 8 tibial implant, size  7 posterior stabilized femoral component, and the size 38 patella are cemented in place and the patella is held with the clamp. The trial insert is placed and the knee held in full extension. The Exparel (20 ml mixed with 60 ml saline) is injected into the extensor mechanism, posterior capsule, medial and lateral gutters and subcutaneous tissues.  All extruded cement is removed and once the cement is hard the permanent 8 mm posterior stabilized rotating platform insert is placed into the tibial tray.      The wound is copiously irrigated with saline solution and the extensor mechanism closed over a hemovac drain with #1 V-loc suture. The tourniquet is released for a total tourniquet time of 39  minutes. Flexion against gravity is 140 degrees and the patella tracks normally. Subcutaneous tissue is closed with 2.0 vicryl and subcuticular with running 4.0 Monocryl. The incision is cleaned and dried and steri-strips and a bulky sterile dressing are applied. The limb is placed into a knee immobilizer and the patient is awakened and transported to recovery in stable condition.      Please note that a surgical assistant was a medical necessity for this procedure in order to perform it in a safe and expeditious manner. Surgical assistant was necessary to retract the ligaments and vital neurovascular structures to prevent injury to them and also necessary for proper positioning of the limb to allow for anatomic placement of the prosthesis.   Dione Plover Torell Minder, MD    12/19/2019, 1:08 PM

## 2019-12-19 NOTE — Anesthesia Postprocedure Evaluation (Signed)
Anesthesia Post Note  Patient: Charles Oconnor  Procedure(s) Performed: TOTAL KNEE ARTHROPLASTY (Left Knee)     Patient location during evaluation: PACU Anesthesia Type: Spinal Level of consciousness: awake and alert Pain management: pain level controlled Vital Signs Assessment: post-procedure vital signs reviewed and stable Respiratory status: spontaneous breathing Cardiovascular status: stable Anesthetic complications: no   No complications documented.  Last Vitals:  Vitals:   12/19/19 1400 12/19/19 1415  BP: 100/60 103/67  Pulse: (!) 53 (!) 51  Resp: 15 13  Temp:    SpO2: 97% 92%    Last Pain:  Vitals:   12/19/19 1400  TempSrc:   PainSc: 0-No pain                 Nolon Nations

## 2019-12-19 NOTE — Progress Notes (Signed)
Assisted Dr. Lissa Hoard with left, ultrasound guided, adductor canal block. Side rails up, monitors on throughout procedure. See vital signs in flow sheet. Tolerated Procedure well.

## 2019-12-19 NOTE — Evaluation (Signed)
Physical Therapy Evaluation Patient Details Name: Charles Oconnor MRN: [id number] DOB: July 18, 1950 Today's Date: 12/19/2019   History of Present Illness  Patient is 69 y.o. male s/p Lt TKA on 12/19/19 with PMH significant for prostate cancer, HTN, GERD, CAD, OA, Rt TKA in 2010, ACDF C3-7.    Clinical Impression  Charles Oconnor is a 69 y.o. male POD 0 s/p Lt TKA. Patient reports independence with mobility at baseline. Patient is now limited by functional impairments (see PT problem list below) and requires min assist for transfers and gait with RW. Patient was able to ambulate ~100 feet with RW and min assist. Patient instructed in exercise to facilitate ROM and circulation. Patient will benefit from continued skilled PT interventions to address impairments and progress towards PLOF. Acute PT will follow to progress mobility and stair training in preparation for safe discharge home.     Follow Up Recommendations Follow surgeon's recommendation for DC plan and follow-up therapies;Outpatient PT    Equipment Recommendations  Rolling walker with 5" wheels;3in1 (PT)    Recommendations for Other Services       Precautions / Restrictions Precautions Precautions: Fall Restrictions Weight Bearing Restrictions: No Other Position/Activity Restrictions: WBAT      Mobility  Bed Mobility Overal bed mobility: Needs Assistance Bed Mobility: Supine to Sit     Supine to sit: Supervision;HOB elevated     General bed mobility comments: cues to use bed rail, no assist required, some extra time needed.  Transfers Overall transfer level: Needs assistance Equipment used: Rolling walker (2 wheeled) Transfers: Sit to/from Stand Sit to Stand: Min assist;From elevated surface         General transfer comment: VC for technique with RW  Ambulation/Gait Ambulation/Gait assistance: Min assist Gait Distance (Feet): 100 Feet Assistive device: Rolling walker (2 wheeled) Gait Pattern/deviations:  Step-to pattern;Decreased stride length;Decreased weight shift to left Gait velocity: decr   General Gait Details: Vcs for step pattern and assist required throughout to maintain safe proximity to RW. no overt LOB noted.  Stairs            Wheelchair Mobility    Modified Rankin (Stroke Patients Only)       Balance Overall balance assessment: Needs assistance Sitting-balance support: Feet supported Sitting balance-Leahy Scale: Good     Standing balance support: Bilateral upper extremity supported;During functional activity Standing balance-Leahy Scale: Fair                Pertinent Vitals/Pain Pain Assessment: 0-10 Pain Location: Lt knee Pain Descriptors / Indicators: Aching;Discomfort Pain Intervention(s): Limited activity within patient's tolerance;Monitored during session;Premedicated before session;Repositioned;Ice applied    Home Living Family/patient expects to be discharged to:: Private residence Living Arrangements: Spouse/significant other Available Help at Discharge: Family Type of Home: House Home Access: Stairs to enter Entrance Stairs-Rails: None Technical brewer of Steps: 1 Home Layout: One level Home Equipment: Shower seat - built in;Grab bars - toilet      Prior Function Level of Independence: Independent           Hand Dominance   Dominant Hand: Right    Extremity/Trunk Assessment   Upper Extremity Assessment Upper Extremity Assessment: Overall WFL for tasks assessed    Lower Extremity Assessment Lower Extremity Assessment: LLE deficits/detail LLE Deficits / Details: good quad activaiton, no extensor lag with SLR LLE Sensation: WNL LLE Coordination: WNL    Cervical / Trunk Assessment Cervical / Trunk Assessment: Normal  Communication   Communication: No difficulties  Cognition Arousal/Alertness: Awake/alert  Behavior During Therapy: WFL for tasks assessed/performed Overall Cognitive Status: Within Functional Limits  for tasks assessed         General Comments      Exercises Total Joint Exercises Ankle Circles/Pumps: AROM;Left;20 reps;Seated Quad Sets: AROM;10 reps;Left;Seated Heel Slides: AAROM;Left;10 reps;Seated   Assessment/Plan    PT Assessment Patient needs continued PT services  PT Problem List Decreased strength;Decreased range of motion;Decreased activity tolerance;Decreased balance;Decreased mobility;Decreased knowledge of use of DME;Decreased knowledge of precautions;Pain       PT Treatment Interventions DME instruction;Gait training;Stair training;Functional mobility training;Therapeutic activities;Therapeutic exercise;Balance training;Patient/family education    PT Goals (Current goals can be found in the Care Plan section)  Acute Rehab PT Goals Patient Stated Goal: get back to fishing PT Goal Formulation: With patient Time For Goal Achievement: 12/26/19 Potential to Achieve Goals: Good    Frequency 7X/week   Barriers to discharge        AM-PAC PT "6 Clicks" Mobility  Outcome Measure Help needed turning from your back to your side while in a flat bed without using bedrails?: None Help needed moving from lying on your back to sitting on the side of a flat bed without using bedrails?: A Little Help needed moving to and from a bed to a chair (including a wheelchair)?: A Little Help needed standing up from a chair using your arms (e.g., wheelchair or bedside chair)?: A Little Help needed to walk in hospital room?: A Little Help needed climbing 3-5 steps with a railing? : A Little 6 Click Score: 19    End of Session Equipment Utilized During Treatment: Gait belt Activity Tolerance: Patient tolerated treatment well Patient left: in chair;with call bell/phone within reach;with chair alarm set Nurse Communication: Mobility status PT Visit Diagnosis: Muscle weakness (generalized) (M62.81);Difficulty in walking, not elsewhere classified (R26.2)    Time: 6063-0160 PT Time  Calculation (min) (ACUTE ONLY): 34 min   Charges:   PT Evaluation $PT Eval Low Complexity: 1 Low PT Treatments $Gait Training: 8-22 mins       Verner Mould, DPT Acute Rehabilitation Services  Office [phone] Pager [phone]  12/19/2019 6:02 PM

## 2019-12-19 NOTE — Anesthesia Procedure Notes (Signed)
Spinal  Patient location during procedure: OR Start time: 12/19/2019 11:50 AM Staffing Performed: resident/CRNA  Resident/CRNA: [place] Tylie Golonka C, CRNA Preanesthetic Checklist Completed: patient identified, IV checked, site marked, risks and benefits discussed, surgical consent, monitors and equipment checked, pre-op evaluation and timeout performed Spinal Block Patient position: sitting Prep: DuraPrep and site prepped and draped Patient monitoring: heart rate, cardiac monitor, continuous pulse ox and blood pressure Approach: midline Location: L3-4 Injection technique: single-shot Needle Needle type: Pencan  Needle gauge: 24 G Needle length: 9 cm Assessment Sensory level: T4 Additional Notes IV functioning, monitors applied to pt. Expiration date of kit checked and confirmed to be in date. Sterile prep and drape, hand hygiene and sterile gloved used. Pt was positioned and spine was prepped in sterile fashion. Skin was anesthetized with lidocaine. Free flow of clear CSF obtained prior to injecting local anesthetic into CSF x 1 attempt. Spinal needle aspirated freely following injection. Needle was carefully withdrawn, and pt tolerated procedure well. Loss of motor and sensory on exam post injection.

## 2019-12-19 NOTE — Plan of Care (Signed)
Discussed with patient and wife about plan of care for post-op day 0.   Will continue to monitor patient.    SWhittemore, RN  

## 2019-12-19 NOTE — Discharge Instructions (Addendum)
 Frank Aluisio, MD Total Joint Specialist [facility] [address] [place], [place] [address] [phone]  TOTAL KNEE REPLACEMENT POSTOPERATIVE DIRECTIONS    Knee Rehabilitation, Guidelines Following Surgery  Results after knee surgery are often greatly improved when you follow the exercise, range of motion and muscle strengthening exercises prescribed by your doctor. Safety measures are also important to protect the knee from further injury. If any of these exercises cause you to have increased pain or swelling in your knee joint, decrease the amount until you are comfortable again and slowly increase them. If you have problems or questions, call your caregiver or physical therapist for advice.   BLOOD CLOT PREVENTION . Take a 10 mg Xarelto once a day for three weeks following surgery. Then resume one 81 mg Aspirin once a day. . You may resume your vitamins/supplements once you have discontinued the Xarelto. . Do not take any NSAIDs (Advil, Aleve, Ibuprofen, Meloxicam, etc.) until you have discontinued the Xarelto.   HOME CARE INSTRUCTIONS  . Remove items at home which could result in a fall. This includes throw rugs or furniture in walking pathways.  . ICE to the affected knee as much as tolerated. Icing helps control swelling. If the swelling is well controlled you will be more comfortable and rehab easier. Continue to use ice on the knee for pain and swelling from surgery. You may notice swelling that will progress down to the foot and ankle. This is normal after surgery. Elevate the leg when you are not up walking on it.    . Continue to use the breathing machine which will help keep your temperature down. It is common for your temperature to cycle up and down following surgery, especially at night when you are not up moving around and exerting yourself. The breathing machine keeps your lungs expanded and your temperature down. . Do not place pillow under  the operative knee, focus on keeping the knee straight while resting  DIET You may resume your previous home diet once you are discharged from the hospital.  DRESSING / WOUND CARE / SHOWERING . Keep your bulky bandage on for 2 days. On the third post-operative day you may remove the Ace bandage and gauze. There is a waterproof adhesive bandage on your skin which will stay in place until your first follow-up appointment. Once you remove this you will not need to place another bandage . You may begin showering 3 days following surgery, but do not submerge the incision under water.  ACTIVITY For the first 5 days, the key is rest and control of pain and swelling . Do your home exercises twice a day starting on post-operative day 3. On the days you go to physical therapy, just do the home exercises once that day. . You should rest, ice and elevate the leg for 50 minutes out of every hour. Get up and walk/stretch for 10 minutes per hour. After 5 days you can increase your activity slowly as tolerated. . Walk with your walker as instructed. Use the walker until you are comfortable transitioning to a cane. Walk with the cane in the opposite hand of the operative leg. You may discontinue the cane once you are comfortable and walking steadily. . Avoid periods of inactivity such as sitting longer than an hour when not asleep. This helps prevent blood clots.  . You may discontinue the knee immobilizer once you are able to perform a straight leg raise while lying down. . You   may resume a sexual relationship in one month or when given the OK by your doctor.   You may return to work once you are cleared by your doctor.   Do not drive a car for 6 weeks or until released by your surgeon.   Do not drive while taking narcotics.  TED HOSE STOCKINGS Wear the elastic stockings on both legs for three weeks following surgery during the day. You may remove them at night for sleeping.  WEIGHT BEARING Weight  bearing as tolerated with assist device (walker, cane, etc) as directed, use it as long as suggested by your surgeon or therapist, typically at least 4-6 weeks.  POSTOPERATIVE CONSTIPATION PROTOCOL Constipation - defined medically as fewer than three stools per week and severe constipation as less than one stool per week.  One of the most common issues patients have following surgery is constipation.  Even if you have a regular bowel pattern at home, your normal regimen is likely to be disrupted due to multiple reasons following surgery.  Combination of anesthesia, postoperative narcotics, change in appetite and fluid intake all can affect your bowels.  In order to avoid complications following surgery, here are some recommendations in order to help you during your recovery period.   Colace (docusate) - Pick up an over-the-counter form of Colace or another stool softener and take twice a day as long as you are requiring postoperative pain medications.  Take with a full glass of water daily.  If you experience loose stools or diarrhea, hold the colace until you stool forms back up. If your symptoms do not get better within 1 week or if they get worse, check with your doctor.  Dulcolax (bisacodyl) - Pick up over-the-counter and take as directed by the product packaging as needed to assist with the movement of your bowels.  Take with a full glass of water.  Use this product as needed if not relieved by Colace only.   MiraLax (polyethylene glycol) - Pick up over-the-counter to have on hand. MiraLax is a solution that will increase the amount of water in your bowels to assist with bowel movements.  Take as directed and can mix with a glass of water, juice, soda, coffee, or tea. Take if you go more than two days without a movement. Do not use MiraLax more than once per day. Call your doctor if you are still constipated or irregular after using this medication for 7 days in a row.  If you continue to have  problems with postoperative constipation, please contact the office for further assistance and recommendations.  If you experience "the worst abdominal pain ever" or develop nausea or vomiting, please contact the office immediatly for further recommendations for treatment.  ITCHING If you experience itching with your medications, try taking only a single pain pill, or even half a pain pill at a time.  You can also use Benadryl over the counter for itching or also to help with sleep.   MEDICATIONS See your medication summary on the After Visit Summary that the nursing staff will review with you prior to discharge.  You may have some home medications which will be placed on hold until you complete the course of blood thinner medication.  It is important for you to complete the blood thinner medication as prescribed by your surgeon.  Continue your approved medications as instructed at time of discharge.  PRECAUTIONS  If you experience chest pain or shortness of breath - call 911 immediately for transfer  to the hospital emergency department.  . If you develop a fever greater that 101 F, purulent drainage from wound, increased redness or drainage from wound, foul odor from the wound/dressing, or calf pain - CONTACT YOUR SURGEON.                                                   FOLLOW-UP APPOINTMENTS Make sure you keep all of your appointments after your operation with your surgeon and caregivers. You should call the office at the above phone number and make an appointment for approximately two weeks after the date of your surgery or on the date instructed by your surgeon outlined in the "After Visit Summary".  RANGE OF MOTION AND STRENGTHENING EXERCISES  Rehabilitation of the knee is important following a knee injury or an operation. After just a few days of immobilization, the muscles of the thigh which control the knee become weakened and shrink (atrophy). Knee exercises are designed to build up the  tone and strength of the thigh muscles and to improve knee motion. Often times heat used for twenty to thirty minutes before working out will loosen up your tissues and help with improving the range of motion but do not use heat for the first two weeks following surgery. These exercises can be done on a training (exercise) mat, on the floor, on a table or on a bed. Use what ever works the best and is most comfortable for you Knee exercises include:  . Leg Lifts - While your knee is still immobilized in a splint or cast, you can do straight leg raises. Lift the leg to 60 degrees, hold for 3 sec, and slowly lower the leg. Repeat 10-20 times 2-3 times daily. Perform this exercise against resistance later as your knee gets better.  . Quad and Hamstring Sets - Tighten up the muscle on the front of the thigh (Quad) and hold for 5-10 sec. Repeat this 10-20 times hourly. Hamstring sets are done by pushing the foot backward against an object and holding for 5-10 sec. Repeat as with quad sets.   Leg Slides: Lying on your back, slowly slide your foot toward your buttocks, bending your knee up off the floor (only go as far as is comfortable). Then slowly slide your foot back down until your leg is flat on the floor again.  Angel Wings: Lying on your back spread your legs to the side as far apart as you can without causing discomfort.  A rehabilitation program following serious knee injuries can speed recovery and prevent re-injury in the future due to weakened muscles. Contact your doctor or a physical therapist for more information on knee rehabilitation.   IF YOU ARE TRANSFERRED TO A SKILLED REHAB FACILITY If the patient is transferred to a skilled rehab facility following release from the hospital, a list of the current medications will be sent to the facility for the patient to continue.  When discharged from the skilled rehab facility, please have the facility set up the patient's Home Health Physical Therapy  prior to being released. Also, the skilled facility will be responsible for providing the patient with their medications at time of release from the facility to include their pain medication, the muscle relaxants, and their blood thinner medication. If the patient is still at the rehab facility at time of the two   week follow up appointment, the skilled rehab facility will also need to assist the patient in arranging follow up appointment in our office and any transportation needs.  MAKE SURE YOU:  . Understand these instructions.  . Get help right away if you are not doing well or get worse.   DENTAL ANTIBIOTICS:  In most cases prophylactic antibiotics for Dental procdeures after total joint surgery are not necessary.  Exceptions are as follows:  1. History of prior total joint infection  2. Severely immunocompromised (Organ Transplant, cancer chemotherapy, Rheumatoid biologic meds such as Humera)  3. Poorly controlled diabetes (A1C &gt; 8.0, blood glucose over 200)  If you have one of these conditions, contact your surgeon for an antibiotic prescription, prior to your dental procedure.    Pick up stool softner and laxative for home use following surgery while on pain medications. Do not submerge incision under water. Please use good hand washing techniques while changing dressing each day. May shower starting three days after surgery. Please use a clean towel to pat the incision dry following showers. Continue to use ice for pain and swelling after surgery. Do not use any lotions or creams on the incision until instructed by your surgeon.  Information on my medicine - XARELTO (Rivaroxaban)  Why was Xarelto prescribed for you? Xarelto was prescribed for you to reduce the risk of blood clots forming after orthopedic surgery. The medical term for these abnormal blood clots is venous thromboembolism (VTE).  What do you need to know about xarelto ? Take your Xarelto ONCE DAILY  at the same time every day. You may take it either with or without food.  If you have difficulty swallowing the tablet whole, you may crush it and mix in applesauce just prior to taking your dose.  Take Xarelto exactly as prescribed by your doctor and DO NOT stop taking Xarelto without talking to the doctor who prescribed the medication.  Stopping without other VTE prevention medication to take the place of Xarelto may increase your risk of developing a clot.  After discharge, you should have regular check-up appointments with your healthcare provider that is prescribing your Xarelto.    What do you do if you miss a dose? If you miss a dose, take it as soon as you remember on the same day then continue your regularly scheduled once daily regimen the next day. Do not take two doses of Xarelto on the same day.   Important Safety Information A possible side effect of Xarelto is bleeding. You should call your healthcare provider right away if you experience any of the following: ? Bleeding from an injury or your nose that does not stop. ? Unusual colored urine (red or dark brown) or unusual colored stools (red or black). ? Unusual bruising for unknown reasons. ? A serious fall or if you hit your head (even if there is no bleeding).  Some medicines may interact with Xarelto and might increase your risk of bleeding while on Xarelto. To help avoid this, consult your healthcare provider or pharmacist prior to using any new prescription or non-prescription medications, including herbals, vitamins, non-steroidal anti-inflammatory drugs (NSAIDs) and supplements.  This website has more information on Xarelto: www.xarelto.com.    

## 2019-12-19 NOTE — Anesthesia Procedure Notes (Signed)
Anesthesia Regional Block: Adductor canal block   Pre-Anesthetic Checklist: ,, timeout performed, Correct Patient, Correct Site, Correct Laterality, Correct Procedure, Correct Position, site marked, Risks and benefits discussed,  Surgical consent,  Pre-op evaluation,  At surgeon's request and post-op pain management  Laterality: Left  Prep: chloraprep       Needles:  Injection technique: Single-shot  Needle Type: Stimiplex     Needle Length: 9cm  Needle Gauge: 21     Additional Needles:   Procedures:,,,, ultrasound used (permanent image in chart),,,,  Narrative:  Start time: 12/19/2019 11:15 AM End time: 12/19/2019 11:20 AM Injection made incrementally with aspirations every 5 mL.  Performed by: Personally  Anesthesiologist: Nolon Nations, MD  Additional Notes: BP cuff, EKG monitors applied. Sedation begun. Artery and nerve location verified with U/S and anesthetic injected incrementally, slowly, and after negative aspirations under direct u/s guidance. Good fascial /perineural spread. Tolerated well.

## 2019-12-19 NOTE — Progress Notes (Signed)
Orthopedic Tech Progress Note Patient Details:  Charles Oconnor [birth year] [id number]  CPM Left Knee CPM Left Knee: On Left Knee Flexion (Degrees): 40 Left Knee Extension (Degrees): 10  Post Interventions Patient Tolerated: Well Instructions Provided: Care of device, Adjustment of device  Bree Heinzelman 12/19/2019, 2:42 PM

## 2019-12-19 NOTE — Transfer of Care (Signed)
Immediate Anesthesia Transfer of Care Note  Patient: Terron L Wolfley  Procedure(s) Performed: TOTAL KNEE ARTHROPLASTY (Left Knee)  Patient Location: PACU  Anesthesia Type:Spinal  Level of Consciousness: awake, alert  and oriented  Airway & Oxygen Therapy: Patient Spontanous Breathing and Patient connected to face mask oxygen  Post-op Assessment: Report given to RN and Post -op Vital signs reviewed and stable  Post vital signs: Reviewed and stable  Last Vitals:  Vitals Value Taken Time  BP 95/59 12/19/19 1326  Temp    Pulse 55 12/19/19 1328  Resp 15 12/19/19 1328  SpO2 96 % 12/19/19 1328  Vitals shown include unvalidated device data.  Last Pain:  Vitals:   12/19/19 1129  TempSrc:   PainSc: 0-No pain      Patients Stated Pain Goal: 3 (82/88/33 7445)  Complications: No complications documented.

## 2019-12-20 ENCOUNTER — Encounter (HOSPITAL_COMMUNITY): Payer: Self-pay | Admitting: Orthopedic Surgery

## 2019-12-20 DIAGNOSIS — M1712 Unilateral primary osteoarthritis, left knee: Secondary | ICD-10-CM | POA: Diagnosis not present

## 2019-12-20 LAB — BASIC METABOLIC PANEL
Anion gap: 11 (ref 5–15)
BUN: 21 mg/dL (ref 8–23)
CO2: 24 mmol/L (ref 22–32)
Calcium: 8.9 mg/dL (ref 8.9–10.3)
Chloride: 104 mmol/L (ref 98–111)
Creatinine, Ser: 1.13 mg/dL (ref 0.61–1.24)
GFR calc Af Amer: >60 mL/min (ref >60)
GFR calc non Af Amer: >60 mL/min (ref >60)
Glucose, Bld: 170 mg/dL — ABNORMAL HIGH (ref 70–99)
Potassium: 4.2 mmol/L (ref 3.5–5.1)
Sodium: 139 mmol/L (ref 135–145)

## 2019-12-20 LAB — CBC
HCT: 38.5 % — ABNORMAL LOW (ref 39.0–52.0)
Hemoglobin: 12.6 g/dL — ABNORMAL LOW (ref 13.0–17.0)
MCH: 29.7 pg (ref 26.0–34.0)
MCHC: 32.7 g/dL (ref 30.0–36.0)
MCV: 90.8 fL (ref 80.0–100.0)
Platelets: 192 10*3/uL (ref 150–400)
RBC: 4.24 MIL/uL (ref 4.22–5.81)
RDW: 13 % (ref 11.5–15.5)
WBC: 14.2 10*3/uL — ABNORMAL HIGH (ref 4.0–10.5)
nRBC: 0 % (ref 0.0–0.2)

## 2019-12-20 MED ORDER — RIVAROXABAN 10 MG PO TABS: 10.0000 mg | ORAL_TABLET | Freq: Every day | ORAL | 0 refills | Status: DC

## 2019-12-20 MED ORDER — METHOCARBAMOL 500 MG PO TABS: 500.0000 mg | ORAL_TABLET | Freq: Four times a day (QID) | ORAL | 0 refills | Status: DC | PRN

## 2019-12-20 MED ORDER — GABAPENTIN 300 MG PO CAPS
300.0000 mg | ORAL_CAPSULE | Freq: Three times a day (TID) | ORAL | Status: DC
  Administered 2019-12-20: 300 mg via ORAL
  Filled 2019-12-20: qty 1

## 2019-12-20 MED ORDER — GABAPENTIN 300 MG PO CAPS
ORAL_CAPSULE | ORAL | 0 refills | Status: DC
Start: 2019-12-20 — End: 2020-01-26

## 2019-12-20 MED ORDER — TRAMADOL HCL 50 MG PO TABS: 50.0000 mg | ORAL_TABLET | Freq: Four times a day (QID) | ORAL | 0 refills | Status: DC | PRN

## 2019-12-20 MED ORDER — OXYCODONE HCL 5 MG PO TABS: 5.0000 mg | ORAL_TABLET | Freq: Four times a day (QID) | ORAL | 0 refills | Status: DC | PRN

## 2019-12-20 NOTE — Progress Notes (Signed)
Physical Therapy Treatment Patient Details Name: Charles Oconnor MRN: [id number] DOB: 06-21-1950 Today's Date: 12/20/2019    History of Present Illness Patient is 69 y.o. male s/p Lt TKA on 12/19/19 with PMH significant for prostate cancer, HTN, GERD, CAD, OA, Rt TKA in 2010, ACDF C3-7.    PT Comments    Progressing well with mobility. Encouraged pt to work on knee flexion ROM (10 reps, 2x/day) in addition to walking often. All education completed. Okay to d/c from PT standpoint.    Follow Up Recommendations  Follow surgeons recommendation for DC plan and follow-up therapies     Equipment Recommendations       Recommendations for Other Services       Precautions / Restrictions Precautions Precautions: Fall Restrictions Weight Bearing Restrictions: No Other Position/Activity Restrictions: WBAT    Mobility  Bed Mobility Overal bed mobility: Modified Independent             General bed mobility comments: oob in recliner  Transfers Overall transfer level: Needs assistance Equipment used: Rolling walker (2 wheeled) Transfers: Sit to/from Stand Sit to Stand: Supervision         General transfer comment: VCs safety, hand placement.  Ambulation/Gait Ambulation/Gait assistance: Supervision Gait Distance (Feet): 185 Feet Assistive device: Rolling walker (2 wheeled) Gait Pattern/deviations: Decreased stride length         Stairs        Wheelchair Mobility    Modified Rankin (Stroke Patients Only)       Balance Overall balance assessment: Needs assistance         Standing balance support: Bilateral upper extremity supported Standing balance-Leahy Scale: Poor                              Cognition Arousal/Alertness: Awake/alert Behavior During Therapy: WFL for tasks assessed/performed Overall Cognitive Status: Within Functional Limits for tasks assessed                                        Exercises      General Comments        Pertinent Vitals/Pain Pain Assessment: 0-10 Pain Score: 5  Pain Location: L knee Pain Descriptors / Indicators: Discomfort;Sore Pain Intervention(s): Monitored during session    Home Living                      Prior Function            PT Goals (current goals can now be found in the care plan section) Progress towards PT goals: Progressing toward goals    Frequency    7X/week      PT Plan Current plan remains appropriate    Co-evaluation              AM-PAC PT "6 Clicks" Mobility   Outcome Measure  Help needed turning from your back to your side while in a flat bed without using bedrails?: None Help needed moving from lying on your back to sitting on the side of a flat bed without using bedrails?: None Help needed moving to and from a bed to a chair (including a wheelchair)?: A Little Help needed standing up from a chair using your arms (e.g., wheelchair or bedside chair)?: A Little Help needed to walk in hospital room?: A Little Help needed climbing 3-5 steps  with a railing? : A Little 6 Click Score: 20    End of Session Equipment Utilized During Treatment: Gait belt Activity Tolerance: Patient tolerated treatment well Patient left: in chair;with call bell/phone within reach;with family/visitor present   PT Visit Diagnosis: Other abnormalities of gait and mobility (R26.89)     Time: 5379-4327 PT Time Calculation (min) (ACUTE ONLY): 8 min  Charges:  $Gait Training: 8-22 mins                         Doreatha Massed, PT Acute Rehabilitation  Office: [phone] Pager: [phone]

## 2019-12-20 NOTE — Progress Notes (Signed)
Physical Therapy Treatment Patient Details Name: Charles Oconnor MRN: [id number] DOB: [birth year] Today's Date: 12/20/2019    History of Present Illness Patient is 69 y.o. male s/p Lt TKA on 12/19/19 with PMH significant for prostate cancer, HTN, GERD, CAD, OA, Rt TKA in 2010, ACDF C3-7.    PT Comments    Progressing well with mobility. Per pt, MD/PA wants him to have a 2nd session prior to d/c, so will see a 2nd time.    Follow Up Recommendations  Follow surgeon's recommendation for DC plan and follow-up therapies     Equipment Recommendations       Recommendations for Other Services       Precautions / Restrictions Precautions Precautions: Fall Restrictions Weight Bearing Restrictions: No Other Position/Activity Restrictions: WBAT    Mobility  Bed Mobility Overal bed mobility: Modified Independent                Transfers Overall transfer level: Needs assistance Equipment used: Rolling walker (2 wheeled) Transfers: Sit to/from Stand Sit to Stand: Supervision         General transfer comment: VCs safety, hand placement.  Ambulation/Gait Ambulation/Gait assistance: Supervision Gait Distance (Feet): 150 Feet Assistive device: Rolling walker (2 wheeled) Gait Pattern/deviations: Decreased stride length         Stairs Stairs: Yes Stairs assistance: Supervision Stair Management: Step to pattern;Forwards;With walker Number of Stairs: 1 General stair comments: VCs safety, technique, sequence.   Wheelchair Mobility    Modified Rankin (Stroke Patients Only)       Balance Overall balance assessment: Needs assistance         Standing balance support: Bilateral upper extremity supported Standing balance-Leahy Scale: Poor                              Cognition Arousal/Alertness: Awake/alert Behavior During Therapy: WFL for tasks assessed/performed Overall Cognitive Status: Within Functional Limits for tasks assessed                                         Exercises Total Joint Exercises Ankle Circles/Pumps: AROM;Both;10 reps Quad Sets: AROM;Both;10 reps Hip ABduction/ADduction: AROM;Left;10 reps Straight Leg Raises: AROM;Left;10 reps Knee Flexion: AROM;Left;10 reps;Seated Goniometric ROM: ~5-90 degrees    General Comments        Pertinent Vitals/Pain Pain Assessment: 0-10 Pain Score: 5  Pain Location: L knee Pain Descriptors / Indicators: Discomfort;Sore Pain Intervention(s): Monitored during session;Repositioned;Ice applied    Home Living                      Prior Function            PT Goals (current goals can now be found in the care plan section) Progress towards PT goals: Progressing toward goals    Frequency    7X/week      PT Plan Current plan remains appropriate    Co-evaluation              AM-PAC PT "6 Clicks" Mobility   Outcome Measure  Help needed turning from your back to your side while in a flat bed without using bedrails?: None Help needed moving from lying on your back to sitting on the side of a flat bed without using bedrails?: None Help needed moving to and from a bed to a chair (including a  wheelchair)?: A Little Help needed standing up from a chair using your arms (e.g., wheelchair or bedside chair)?: A Little Help needed to walk in hospital room?: A Little Help needed climbing 3-5 steps with a railing? : A Little 6 Click Score: 20    End of Session Equipment Utilized During Treatment: Gait belt Activity Tolerance: Patient tolerated treatment well Patient left: in chair;with call bell/phone within reach   PT Visit Diagnosis: Other abnormalities of gait and mobility (R26.89)     Time: 4199-1444 PT Time Calculation (min) (ACUTE ONLY): 21 min  Charges:  $Gait Training: 8-22 mins                        Doreatha Massed, PT Acute Rehabilitation  Office: [phone] Pager: [phone]

## 2019-12-20 NOTE — Progress Notes (Signed)
   Subjective: 1 Day Post-Op Procedure(s) (LRB): TOTAL KNEE ARTHROPLASTY (Left) Patient reports pain as mild.   Patient seen in rounds by Dr. Wynelle Link. Patient is well, and has had no acute complaints or problems. No issues overnight. Denies chest pain, SOB, or calf pain. Foley catheter to be removed this AM. We will continue therapy today, ambulated 100' yesterday.  Objective: Vital signs in last 24 hours: Temp:  [97.5 F (36.4 C)-98.6 F (37 C)] 98.2 F (36.8 C) (08/10 0612) Pulse Rate:  [49-66] 52 (08/10 0612) Resp:  [10-20] 16 (08/10 0612) BP: (94-138)/(58-85) 121/67 (08/10 0612) SpO2:  [92 %-100 %] 96 % (08/10 0612) Weight:  [92.1 kg] 92.1 kg (08/09 1515)  Intake/Output from previous day:  Intake/Output Summary (Last 24 hours) at 12/20/2019 0735 Last data filed at 12/20/2019 0612 Gross per 24 hour  Intake 3058.75 ml  Output 1350 ml  Net 1708.75 ml     Intake/Output this shift: No intake/output data recorded.  Labs: Recent Labs    12/20/19 0319  HGB 12.6*   Recent Labs    12/20/19 0319  WBC 14.2*  RBC 4.24  HCT 38.5*  PLT 192   Recent Labs    12/20/19 0319  NA 139  K 4.2  CL 104  CO2 24  BUN 21  CREATININE 1.13  GLUCOSE 170*  CALCIUM 8.9   No results for input(s): LABPT, INR in the last 72 hours.  Exam: General - Patient is Alert and Oriented Extremity - Neurologically intact Neurovascular intact Sensation intact distally Dorsiflexion/Plantar flexion intact Dressing - dressing C/D/I Motor Function - intact, moving foot and toes well on exam.   Past Medical History:  Diagnosis Date  . Arthritis    Back knees  . Cervical spinal stenosis   . Complication of anesthesia   . Coronary artery disease    mild to moderate  . Dyslipidemia   . Dyspnea   . ED (erectile dysfunction)   . GERD (gastroesophageal reflux disease)   . Hypertension   . PONV (postoperative nausea and vomiting)    Happened once 30 yrs ago. No problems since.  . Prostate  cancer ([place])     Assessment/Plan: 1 Day Post-Op Procedure(s) (LRB): TOTAL KNEE ARTHROPLASTY (Left) Principal Problem:   OA (osteoarthritis) of knee Active Problems:   Primary osteoarthritis of left knee  Estimated body mass index is 26.79 kg/m as calculated from the following:   Height as of this encounter: 6\' 1"  (1.854 m).   Weight as of this encounter: 92.1 kg. Advance diet Up with therapy D/C IV fluids   Patient's anticipated LOS is less than 2 midnights, meeting these requirements: - Lives within 1 hour of care - Has a competent adult at home to recover with post-op recover - NO history of  - Chronic pain requiring opioids  - Diabetes  - Heart failure  - Heart attack  - Stroke  - DVT/VTE  - Cardiac arrhythmia  - Respiratory Failure/COPD  - Renal failure  - Anemia  - Advanced Liver disease  DVT Prophylaxis - Xarelto Weight bearing as tolerated. Continue therapy.  Plan is to go Home after hospital stay. Plan for discharge later today if progresses with therapy and is meeting his goals. Scheduled for outpatient physical therapy at [facility]. Follow-up in the office August 24th.  The PDMP database was reviewed today (12/20/2019) prior to any opioid medications being prescribed to this patient.   Theresa Duty, PA-C Orthopedic Surgery [phone] 12/20/2019, 7:35 AM

## 2019-12-20 NOTE — Plan of Care (Signed)
Plan of care reviewed and discussed with the patient. 

## 2019-12-20 NOTE — Progress Notes (Signed)
Pt is Ortho Bundle - met briefly to confirm he is receiving rolling walker and 3n1 commode from [place].  Plan for OPPT at [facility] location.  No TOC needs.  Latoi Giraldo, LCSW

## 2019-12-21 NOTE — Discharge Summary (Signed)
Physician Discharge Summary   Patient ID: Charles Oconnor MRN: [id number] DOB/AGE: 11/15/1950 69 y.o.  Admit date: 12/19/2019 Discharge date: 12/20/2019  Primary Diagnosis: Osteoarthritis, left knee   Admission Diagnoses:  Past Medical History:  Diagnosis Date  . Arthritis    Back knees  . Cervical spinal stenosis   . Complication of anesthesia   . Coronary artery disease    mild to moderate  . Dyslipidemia   . Dyspnea   . ED (erectile dysfunction)   . GERD (gastroesophageal reflux disease)   . Hypertension   . PONV (postoperative nausea and vomiting)    Happened once 30 yrs ago. No problems since.  . Prostate cancer [facility])    Discharge Diagnoses:   Principal Problem:   OA (osteoarthritis) of knee Active Problems:   Primary osteoarthritis of left knee  Estimated body mass index is 26.79 kg/m as calculated from the following:   Height as of this encounter: 6' 1"  (1.854 m).   Weight as of this encounter: 92.1 kg.  Procedure:  Procedure(s) (LRB): TOTAL KNEE ARTHROPLASTY (Left)   Consults: None  HPI: Charles Oconnor is a 69 y.o. year old male with end stage OA of his left knee with progressively worsening pain and dysfunction. He has constant pain, with activity and at rest and significant functional deficits with difficulties even with ADLs. He has had extensive non-op management including analgesics, injections of cortisone and viscosupplements, and home exercise program, but remains in significant pain with significant dysfunction. Radiographs show bone on bone arthritis medial and patellofemoral. He presents now for left Total Knee Arthroplasty.  Laboratory Data: Admission on 12/19/2019, Discharged on 12/20/2019  Component Date Value Ref Range Status  . WBC 12/20/2019 14.2* 4.0 - 10.5 K/uL Final  . RBC 12/20/2019 4.24  4.22 - 5.81 MIL/uL Final  . Hemoglobin 12/20/2019 12.6* 13.0 - 17.0 g/dL Final  . HCT 12/20/2019 38.5* 39 - 52 % Final  . MCV 12/20/2019 90.8  80.0 -  100.0 fL Final  . MCH 12/20/2019 29.7  26.0 - 34.0 pg Final  . MCHC 12/20/2019 32.7  30.0 - 36.0 g/dL Final  . RDW 12/20/2019 13.0  11.5 - 15.5 % Final  . Platelets 12/20/2019 192  150 - 400 K/uL Final  . nRBC 12/20/2019 0.0  0.0 - 0.2 % Final   Performed at [facility], [place] [address] [place], [place] [address]  . Sodium 12/20/2019 139  135 - 145 mmol/L Final  . Potassium 12/20/2019 4.2  3.5 - 5.1 mmol/L Final  . Chloride 12/20/2019 104  98 - 111 mmol/L Final  . CO2 12/20/2019 24  22 - 32 mmol/L Final  . Glucose, Bld 12/20/2019 170* 70 - 99 mg/dL Final   Glucose reference range applies only to samples taken after fasting for at least 8 hours.  . BUN 12/20/2019 21  8 - 23 mg/dL Final  . Creatinine, Ser 12/20/2019 1.13  0.61 - 1.24 mg/dL Final  . Calcium 12/20/2019 8.9  8.9 - 10.3 mg/dL Final  . GFR calc non Af Amer 12/20/2019 >60  >60 mL/min Final  . GFR calc Af Amer 12/20/2019 >60  >60 mL/min Final  . Anion gap 12/20/2019 11  5 - 15 Final   Performed at [facility], [place] [address] [place], [place] [address]  Hospital Outpatient Visit on 12/15/2019  Component Date Value Ref Range Status  . SARS Coronavirus 2 12/15/2019 NEGATIVE  NEGATIVE Final   Comment: (NOTE) SARS-CoV-2 target  nucleic acids are NOT DETECTED.  The SARS-CoV-2 RNA is generally detectable in upper and lower respiratory specimens during the acute phase of infection. Negative results do not preclude SARS-CoV-2 infection, do not rule out co-infections with other pathogens, and should not be used as the sole basis for treatment or other patient management decisions. Negative results must be combined with clinical observations, patient history, and epidemiological information. The expected result is Negative.  Fact Sheet for Patients: SugarRoll.be  Fact Sheet for Healthcare Providers: https://www.woods-mathews.com/  This test is not  yet approved or cleared by the [place] FDA and  has been authorized for detection and/or diagnosis of SARS-CoV-2 by FDA under an Emergency Use Authorization (EUA). This EUA will remain  in effect (meaning this test can be used) for the duration of the COVID-19 declaration under Se                          ction 564(b)(1) of the Act, 21 U.S.C. section 360bbb-3(b)(1), unless the authorization is terminated or revoked sooner.  Performed at [facility], [place] [address] [place], [place] [address]   Hospital Outpatient Visit on 12/12/2019  Component Date Value Ref Range Status  . aPTT 12/12/2019 29  24 - 36 seconds Final   Performed at [facility], [place] [address] [place], [place] [address]  . WBC 12/12/2019 6.1  4.0 - 10.5 K/uL Final  . RBC 12/12/2019 4.69  4.22 - 5.81 MIL/uL Final  . Hemoglobin 12/12/2019 13.9  13.0 - 17.0 g/dL Final  . HCT 12/12/2019 41.9  39 - 52 % Final  . MCV 12/12/2019 89.3  80.0 - 100.0 fL Final  . MCH 12/12/2019 29.6  26.0 - 34.0 pg Final  . MCHC 12/12/2019 33.2  30.0 - 36.0 g/dL Final  . RDW 12/12/2019 13.2  11.5 - 15.5 % Final  . Platelets 12/12/2019 197  150 - 400 K/uL Final  . nRBC 12/12/2019 0.0  0.0 - 0.2 % Final   Performed at [facility], [place] [address], [place], [place] [address]  . Sodium 12/12/2019 142  135 - 145 mmol/L Final  . Potassium 12/12/2019 4.5  3.5 - 5.1 mmol/L Final  . Chloride 12/12/2019 103  98 - 111 mmol/L Final  . CO2 12/12/2019 28  22 - 32 mmol/L Final  . Glucose, Bld 12/12/2019 116* 70 - 99 mg/dL Final   Glucose reference range applies only to samples taken after fasting for at least 8 hours.  . BUN 12/12/2019 18  8 - 23 mg/dL Final  . Creatinine, Ser 12/12/2019 1.26* 0.61 - 1.24 mg/dL Final  . Calcium 12/12/2019 10.0  8.9 - 10.3 mg/dL Final  . Total Protein 12/12/2019 7.6  6.5 - 8.1 g/dL Final  . Albumin 12/12/2019 4.8  3.5 - 5.0 g/dL Final  . AST 12/12/2019 21  15 - 41 U/L  Final  . ALT 12/12/2019 22  0 - 44 U/L Final  . Alkaline Phosphatase 12/12/2019 53  38 - 126 U/L Final  . Total Bilirubin 12/12/2019 1.1  0.3 - 1.2 mg/dL Final  . GFR calc non Af Amer 12/12/2019 57* >60 mL/min Final  . GFR calc Af Amer 12/12/2019 >60  >60 mL/min Final  . Anion gap 12/12/2019 11  5 - 15 Final   Performed at [facility], [place] [address] [place], [place] [address]  . Prothrombin Time 12/12/2019 12.8  11.4 - 15.2 seconds Final  .  INR 12/12/2019 1.0  0.8 - 1.2 Final   Comment: (NOTE) INR goal varies based on device and disease states. Performed at [facility], [place] [address], [place], [place] [address]   . ABO/RH(D) 12/12/2019 O POS   Final  . Antibody Screen 12/12/2019 NEG   Final  . Sample Expiration 12/12/2019 12/22/2019,2359   Final  . Extend sample reason 12/12/2019    Final                   Value:NO TRANSFUSIONS OR PREGNANCY IN THE PAST 3 MONTHS Performed at [facility], [place] [address], [place], [place] [address]   . MRSA, PCR 12/12/2019 NEGATIVE  NEGATIVE Final  . Staphylococcus aureus 12/12/2019 NEGATIVE  NEGATIVE Final   Comment: (NOTE) The Xpert SA Assay (FDA approved for NASAL specimens in patients 56 years of age and older), is one component of a comprehensive surveillance program. It is not intended to diagnose infection nor to guide or monitor treatment. Performed at [facility], [place] [address], [place], [place] [address]   Hospital Outpatient Visit on 11/18/2019  Component Date Value Ref Range Status  . SARS Coronavirus 2 11/18/2019 NEGATIVE  NEGATIVE Final   Comment: (NOTE) SARS-CoV-2 target nucleic acids are NOT DETECTED.  The SARS-CoV-2 RNA is generally detectable in upper and lower respiratory specimens during the acute phase of infection. Negative results do not preclude SARS-CoV-2 infection, do not rule out co-infections with other pathogens, and should not be  used as the sole basis for treatment or other patient management decisions. Negative results must be combined with clinical observations, patient history, and epidemiological information. The expected result is Negative.  Fact Sheet for Patients: SugarRoll.be  Fact Sheet for Healthcare Providers: https://www.woods-mathews.com/  This test is not yet approved or cleared by the [place] FDA and  has been authorized for detection and/or diagnosis of SARS-CoV-2 by FDA under an Emergency Use Authorization (EUA). This EUA will remain  in effect (meaning this test can be used) for the duration of the COVID-19 declaration under Se                          ction 564(b)(1) of the Act, 21 U.S.C. section 360bbb-3(b)(1), unless the authorization is terminated or revoked sooner.  Performed at [facility], [place] [address], [place], [place] [address]   Office Visit on 10/26/2019  Component Date Value Ref Range Status  . Glucose 11/09/2019 85  65 - 99 mg/dL Final  . BUN 11/09/2019 24  8 - 27 mg/dL Final  . Creatinine, Ser 11/09/2019 1.34* 0.76 - 1.27 mg/dL Final  . GFR calc non Af Amer 11/09/2019 53* >59 mL/min/1.73 Final  . GFR calc Af Amer 11/09/2019 62  >59 mL/min/1.73 Final   Comment: **[facility] currently reports eGFR in compliance with the current**   recommendations of the [employer]. [facility] will   update reporting as new guidelines are published from the NKF-ASN   Task force.   . BUN/Creatinine Ratio 11/09/2019 18  10 - 24 Final  . Sodium 11/09/2019 144  134 - 144 mmol/L Final  . Potassium 11/09/2019 3.8  3.5 - 5.2 mmol/L Final  . Chloride 11/09/2019 102  96 - 106 mmol/L Final  . CO2 11/09/2019 25  20 - 29 mmol/L Final  . Calcium 11/09/2019 10.0  8.6 - 10.2 mg/dL Final     X-Rays:No results found.  EKG: Orders placed  or performed during the hospital encounter of 09/27/19  . EKG 12-Lead  . EKG 12-Lead  . EKG  12-Lead  . EKG 12-Lead     Hospital Course: Charles Oconnor is a 69 y.o. who was admitted to [facility]. They were brought to the operating room on 12/19/2019 and underwent Procedure(s): TOTAL KNEE ARTHROPLASTY.  Patient tolerated the procedure well and was later transferred to the recovery room and then to the orthopaedic floor for postoperative care. They were given PO and IV analgesics for pain control following their surgery. They were given 24 hours of postoperative antibiotics of  Anti-infectives (From admission, onward)   Start     Dose/Rate Route Frequency Ordered Stop   12/19/19 1800  ceFAZolin (ANCEF) IVPB 2g/100 mL premix        2 g 200 mL/hr over 30 Minutes Intravenous Every 6 hours 12/19/19 1507 12/19/19 2350   12/19/19 0930  ceFAZolin (ANCEF) IVPB 2g/100 mL premix        2 g 200 mL/hr over 30 Minutes Intravenous On call to O.R. 12/19/19 8453 12/19/19 1153     and started on DVT prophylaxis in the form of Xarelto.   PT and OT were ordered for total joint protocol. Discharge planning consulted to help with postop disposition and equipment needs.  Patient had a good night on the evening of surgery. They started to get up OOB with therapy on POD #0. Pt was seen during rounds and was ready to go home pending progress with therapy. He worked with therapy on POD #1 and was meeting his goals. Pt was discharged to home later that day in stable condition.  Diet: Cardiac diet Activity: WBAT Follow-up: in 2 weeks Disposition: Home with outpatient PT Discharged Condition: stable   Discharge Instructions    Call MD / Call 911   Complete by: As directed    If you experience chest pain or shortness of breath, CALL 911 and be transported to the hospital emergency room.  If you develope a fever above 101 F, pus (white drainage) or increased drainage or redness at the wound, or calf pain, call your surgeon's office.   Change dressing   Complete by: As directed    You may remove the  bulky bandage (ACE wrap and gauze) two days after surgery. You will have an adhesive waterproof bandage underneath. Leave this in place until your first follow-up appointment.   Constipation Prevention   Complete by: As directed    Drink plenty of fluids.  Prune juice may be helpful.  You may use a stool softener, such as Colace (over the counter) 100 mg twice a day.  Use MiraLax (over the counter) for constipation as needed.   Diet - low sodium heart healthy   Complete by: As directed    Do not put a pillow under the knee. Place it under the heel.   Complete by: As directed    Driving restrictions   Complete by: As directed    No driving for two weeks   TED hose   Complete by: As directed    Use stockings (TED hose) for three weeks on both leg(s).  You may remove them at night for sleeping.   Weight bearing as tolerated   Complete by: As directed      Allergies as of 12/20/2019      Reactions   Zocor [simvastatin] Nausea Only, Other (See Comments)   Elevate liver enzymes      Medication  List    STOP taking these medications   aspirin EC 81 MG tablet   multivitamin with minerals Tabs tablet     TAKE these medications   amLODipine 10 MG tablet Commonly known as: NORVASC Take 10 mg by mouth daily.   gabapentin 300 MG capsule Commonly known as: Neurontin Take a 300 mg capsule three times a day for two weeks following surgery.Then take a 300 mg capsule two times a day for two weeks. Then take a 300 mg capsule once a day for two weeks. Then discontinue.   isosorbide mononitrate 30 MG 24 hr tablet Commonly known as: IMDUR Take 1 tablet (30 mg total) by mouth daily.   lisinopril-hydrochlorothiazide 20-12.5 MG tablet Commonly known as: Zestoretic Take 2 tablets by mouth daily.   methocarbamol 500 MG tablet Commonly known as: ROBAXIN Take 1 tablet (500 mg total) by mouth every 6 (six) hours as needed for muscle spasms. Notes to patient: Muscle relaxer   oxyCODONE 5 MG  immediate release tablet Commonly known as: Oxy IR/ROXICODONE Take 1-2 tablets (5-10 mg total) by mouth every 6 (six) hours as needed for severe pain. Not to exceed 6 tablets a day.   pantoprazole 40 MG tablet Commonly known as: PROTONIX Take 40 mg by mouth daily.   rivaroxaban 10 MG Tabs tablet Commonly known as: XARELTO Take 1 tablet (10 mg total) by mouth daily with breakfast for 20 days. Then resume one 81 mg Aspirin once a day. Notes to patient: Blood thinner medication; given this morning; small pink tablet   rosuvastatin 40 MG tablet Commonly known as: CRESTOR Take 1 tablet (40 mg total) by mouth daily.   traMADol 50 MG tablet Commonly known as: ULTRAM Take 1-2 tablets (50-100 mg total) by mouth every 6 (six) hours as needed for moderate pain.            Discharge Care Instructions  (From admission, onward)         Start     Ordered   12/20/19 0000  Weight bearing as tolerated        12/20/19 0740   12/20/19 0000  Change dressing       Comments: You may remove the bulky bandage (ACE wrap and gauze) two days after surgery. You will have an adhesive waterproof bandage underneath. Leave this in place until your first follow-up appointment.   12/20/19 0740          Follow-up [facility]. Go on 12/22/2019.   Why: You are scheduled for a physical therapy appointment on 12-22-19 at 11:00 am.        Gaynelle Arabian, MD. Go on 01/03/2020.   Specialty: Orthopedic Surgery Why: You are scheduled for a post-operative appointment on 01-03-20 at 4:30 pm.  Contact information: [address] [place] [address] [phone]               Signed: Theresa Duty, PA-C Orthopedic Surgery 12/21/2019, 8:41 AM

## 2019-12-22 ENCOUNTER — Other Ambulatory Visit: Payer: Self-pay

## 2019-12-22 ENCOUNTER — Encounter: Payer: Self-pay | Admitting: Physical Therapy

## 2019-12-22 ENCOUNTER — Ambulatory Visit: Payer: Medicare Other | Attending: Orthopedic Surgery | Admitting: Physical Therapy

## 2019-12-22 DIAGNOSIS — R262 Difficulty in walking, not elsewhere classified: Secondary | ICD-10-CM | POA: Diagnosis not present

## 2019-12-22 DIAGNOSIS — M25662 Stiffness of left knee, not elsewhere classified: Secondary | ICD-10-CM | POA: Diagnosis not present

## 2019-12-22 DIAGNOSIS — R600 Localized edema: Secondary | ICD-10-CM

## 2019-12-22 DIAGNOSIS — M25562 Pain in left knee: Secondary | ICD-10-CM

## 2019-12-22 NOTE — Therapy (Signed)
Charles Oconnor, [place], [address] Phone: [phone]   Fax:  [phone]  Physical Therapy Evaluation  Patient Details  Name: Charles Oconnor MRN: [id number] Date of Birth: 27-Sep-1950 Referring Provider (PT): Alusio   Encounter Date: 12/22/2019   PT End of Session - 12/22/19 1303    Visit Number 1    Date for PT Re-Evaluation 02/21/20    PT Start Time 1100    PT Stop Time 1137    PT Time Calculation (min) 37 min    Activity Tolerance Patient tolerated treatment well    Behavior During Therapy Charles Oconnor for tasks assessed/performed           Past Medical History:  Diagnosis Date  . Arthritis    Back knees  . Cervical spinal stenosis   . Complication of anesthesia   . Coronary artery disease    mild to moderate  . Dyslipidemia   . Dyspnea   . ED (erectile dysfunction)   . GERD (gastroesophageal reflux disease)   . Hypertension   . PONV (postoperative nausea and vomiting)    Happened once 30 yrs ago. No problems since.  . Prostate cancer [name]     Past Surgical History:  Procedure Laterality Date  . ANTERIOR CERVICAL DECOMPRESSION/DISCECTOMY FUSION 4 LEVELS N/A 09/22/2017   Procedure: ACDF - C3-C4 - C4-C5 - C5-C6 - C6-C7;  Surgeon: Earnie Larsson, MD;  Location: [place];  Service: Neurosurgery;  Laterality: N/A;  . APPENDECTOMY  1985  . CARDIAC CATHETERIZATION  2002, 2011   mild-mod CAD  . CARDIAC CATHETERIZATION N/A 03/12/2015   Procedure: Left Heart Cath and Coronary Angiography;  Surgeon: Pixie Casino, MD;  Location: [place] [facility];  Service: Cardiovascular;  Laterality: N/A;  . COLON RESECTION N/A 02/22/2019   Procedure: LAPAROSCOPIC COLON RESECTION AND DIVERTICULECTOMY;  Surgeon: Kieth Brightly, Arta Bruce, MD;  [facility];  Service: General;  Laterality: N/A;  . HAND SURGERY Left    had joints fused  . JOINT REPLACEMENT Right 04/11/2009  . LAPAROSCOPIC LYSIS OF ADHESIONS N/A  02/22/2019   Procedure: LAPAROSCOPIC LYSIS OF ADHESIONS;  Surgeon: Kieth Brightly Arta Bruce, MD;  [facility];  Service: General;  Laterality: N/A;  . PROSTATECTOMY  03/2007  . RIGHT/LEFT HEART CATH AND CORONARY ANGIOGRAPHY N/A 09/27/2019   Procedure: RIGHT/LEFT HEART CATH AND CORONARY ANGIOGRAPHY;  Surgeon: Jolaine Artist, MD;  Location: [place] [facility];  Service: Cardiovascular;  Laterality: N/A;  . TOTAL KNEE ARTHROPLASTY  04/2009  . TOTAL KNEE ARTHROPLASTY Left 12/19/2019   Procedure: TOTAL KNEE ARTHROPLASTY;  Surgeon: Gaynelle Arabian, MD;  [facility];  Service: Orthopedics;  Laterality: Left;  50 mins    There were no vitals filed for this visit.    Subjective Assessment - 12/22/19 1102    Subjective Pt presents to clinic following L TKA on 12/19/2019. Pt reports that he was doing well until reducing pain medicine; states he is very painful and swollen today.    Pertinent History R TKA 11 yrs ago    Limitations Sitting;Standing;Walking;House hold activities    Patient Stated Goals reduce pain, regain motion    Currently in Pain? Yes    Pain Score 8     Pain Location Knee    Pain Orientation Left    Pain Descriptors / Indicators Aching;Dull    Pain Type Surgical pain    Pain Onset In the past 7 days    Pain  Frequency Intermittent    Aggravating Factors  bending, raising leg, standing, walking    Pain Relieving Factors rest, pain meds, ice              [facility] PT Assessment - 12/22/19 0001      Assessment   Medical Diagnosis L TKA    Referring Provider (PT) Alusio    Onset Date/Surgical Date 12/19/19    Prior Therapy PT for R TKA      Precautions   Precautions None      Restrictions   Weight Bearing Restrictions No      Balance Screen   Has the patient fallen in the past 6 months No    Has the patient had a decrease in activity level because of a fear of falling?  No    Is the patient reluctant to leave their home because of a fear of falling?  No       Home Environment   Additional Comments no stairs at home; small step up to get into house      Prior Function   Level of Independence Independent    Vocation Retired    Leisure fishing, Haematologist      Observation/Other Assessments-Edema    Edema Circumferential      Circumferential Edema   Circumferential - Right 43 cm    Circumferential - Left  48 cm      Sensation   Light Touch Appears Intact      Functional Tests   Functional tests Sit to Stand      Sit to Stand   Comments difficulty rising from blue chair      ROM / Strength   AROM / PROM / Strength AROM;PROM      AROM   AROM Assessment Site Knee    Right/Left Knee Left    Left Knee Extension 25    Left Knee Flexion 80      PROM   PROM Assessment Site Knee    Right/Left Knee Left    Left Knee Extension 20    Left Knee Flexion 85      Palpation   Palpation comment tender in L quad      Ambulation/Gait   Gait Comments antalgic gait; excessive leaning through UE on RW      Standardized Balance Assessment   Standardized Balance Assessment Timed Up and Go Test      Timed Up and Go Test   Normal TUG (seconds) 30.82    TUG Comments with RW                      Objective measurements completed on examination: See above findings.       [facility] Adult PT Treatment/Exercise - 12/22/19 0001      Exercises   Exercises Knee/Hip      Knee/Hip Exercises: Stretches   Knee: Self-Stretch to increase Flexion 2 reps;10 seconds;Left    Other Knee/Hip Stretches knee ext stretch with quad contraction       Knee/Hip Exercises: Supine   Short Arc Quad Sets Left;2 sets;10 reps    Straight Leg Raises Left;2 sets;10 reps                  PT Education - 12/22/19 1303    Education Details Pt educated on POC and HEP    Methods Explanation;Handout;Demonstration    Comprehension Verbalized understanding;Returned demonstration            PT  Short Term Goals - 12/22/19 1308      PT SHORT TERM GOAL  #1   Title Pt will be independent with HEP    Time 2    Period Weeks    Status New    Target Date 01/05/20             PT Long Term Goals - 12/22/19 1309      PT LONG TERM GOAL #1   Title Pt will demo L knee flexion 110 deg    Baseline 85    Time 8    Period Weeks    Status New    Target Date 02/16/20      PT LONG TERM GOAL #2   Title Pt will demo L knee extension 0 deg    Baseline -20    Time 8    Period Weeks    Status New    Target Date 02/16/20      PT LONG TERM GOAL #3   Title Pt will demo TUG time <15 sec with no AD    Baseline 30 sec with RW    Time 8    Period Weeks    Status New    Target Date 02/16/20      PT LONG TERM GOAL #4   Title Pt will demo decrease in circumferential edema to <2 cm difference b/n R and L    Time 8    Period Weeks    Status New    Target Date 02/16/20                  Plan - 12/22/19 1304    Clinical Impression Statement Pt presents to clinic s/p L TKA on 12/19/2019. Pt ambulated into clinic with RW; antalgic gait with excessive leaning through UE. Pt demos PROM at 85 deg L knee flexion and -20 deg L knee extension. Pt tolerated initiation of exercise well; reports increased pain today.    Personal Factors and Comorbidities Comorbidity 2    Comorbidities arthritis, HTN    Examination-Activity Limitations Lift;Locomotion Level;Sit;Squat;Stairs;Stand    Examination-Participation Restrictions Community Activity;Interpersonal Relationship    Stability/Clinical Decision Making Evolving/Moderate complexity    Clinical Decision Making Low    Rehab Potential Good    PT Frequency 2x / week    PT Duration 8 weeks    PT Treatment/Interventions ADLs/Self Care Home Management;Cryotherapy;Electrical Stimulation;Gait training;Stair training;Functional mobility training;Therapeutic activities;Therapeutic exercise;Neuromuscular re-education;Manual techniques;Patient/family education;Scar mobilization;Passive range of  motion;Vasopneumatic Device    PT Next Visit Plan ROM and function    PT Home Exercise Plan knee flexion stretch seated, extension stretch with quad contraction, SLR, SAQ    Consulted and Agree with Plan of Care Patient           Patient will benefit from skilled therapeutic intervention in order to improve the following deficits and impairments:  Abnormal gait, Decreased range of motion, Difficulty walking, Pain, Hypomobility, Impaired flexibility, Decreased mobility, Decreased strength, Increased edema  Visit Diagnosis: Acute pain of left knee  Stiffness of left knee, not elsewhere classified  Difficulty in walking, not elsewhere classified  Localized edema     Problem List Patient Active Problem List   Diagnosis Date Noted  . OA (osteoarthritis) of knee 12/19/2019  . Primary osteoarthritis of left knee 12/19/2019  . Small bowel obstruction ([place]) 02/23/2019  . Respiratory distress following surgery 09/24/2017  . Cervical spondylosis with radiculopathy 09/22/2017  . Viral URI with cough 04/01/2016  . Chest pain, unspecified   .  HTN (hypertension) 01/13/2013  . Dyslipidemia 01/13/2013  . CAD (coronary artery disease) 01/13/2013   Amador Cunas, PT, DPT Donald Prose Oconnor Hailes 12/22/2019, 1:19 PM  [place], [place], [address] Phone: [phone]   Fax:  [phone]  Name: Charles Oconnor MRN: [id number] Date of Birth: [birth year]

## 2019-12-27 ENCOUNTER — Other Ambulatory Visit: Payer: Self-pay

## 2019-12-27 ENCOUNTER — Encounter: Payer: Self-pay | Admitting: Physical Therapy

## 2019-12-27 ENCOUNTER — Ambulatory Visit: Payer: Medicare Other | Admitting: Physical Therapy

## 2019-12-27 DIAGNOSIS — M25562 Pain in left knee: Secondary | ICD-10-CM

## 2019-12-27 DIAGNOSIS — R262 Difficulty in walking, not elsewhere classified: Secondary | ICD-10-CM | POA: Diagnosis not present

## 2019-12-27 DIAGNOSIS — R600 Localized edema: Secondary | ICD-10-CM

## 2019-12-27 DIAGNOSIS — M25662 Stiffness of left knee, not elsewhere classified: Secondary | ICD-10-CM

## 2019-12-27 NOTE — Therapy (Signed)
[place], [place], [address] Phone: [phone]   Fax:  [phone]  Physical Therapy Treatment  Patient Details  Name: Charles Oconnor MRN: [id number] Date of Birth: 1951-03-15 Referring Provider (PT): Alusio   Encounter Date: 12/27/2019   PT End of Session - 12/27/19 1217    Visit Number 2    Date for PT Re-Evaluation 02/21/20    PT Start Time 1141    PT Stop Time 1220    PT Time Calculation (min) 39 min    Activity Tolerance Patient tolerated treatment well    Behavior During Therapy [facility] for tasks assessed/performed           Past Medical History:  Diagnosis Date  . Arthritis    Back knees  . Cervical spinal stenosis   . Complication of anesthesia   . Coronary artery disease    mild to moderate  . Dyslipidemia   . Dyspnea   . ED (erectile dysfunction)   . GERD (gastroesophageal reflux disease)   . Hypertension   . PONV (postoperative nausea and vomiting)    Happened once 30 yrs ago. No problems since.  . Prostate cancer [facility])     Past Surgical History:  Procedure Laterality Date  . ANTERIOR CERVICAL DECOMPRESSION/DISCECTOMY FUSION 4 LEVELS N/A 09/22/2017   Procedure: ACDF - C3-C4 - C4-C5 - C5-C6 - C6-C7;  Surgeon: Earnie Larsson, MD;  Location: [place];  Service: Neurosurgery;  Laterality: N/A;  . APPENDECTOMY  1985  . CARDIAC CATHETERIZATION  2002, 2011   mild-mod CAD  . CARDIAC CATHETERIZATION N/A 03/12/2015   Procedure: Left Heart Cath and Coronary Angiography;  Surgeon: Pixie Casino, MD;  Location: [place] [facility];  Service: Cardiovascular;  Laterality: N/A;  . COLON RESECTION N/A 02/22/2019   Procedure: LAPAROSCOPIC COLON RESECTION AND DIVERTICULECTOMY;  Surgeon: Kieth Brightly, Arta Bruce, MD;  [facility];  Service: General;  Laterality: N/A;  . HAND SURGERY Left    had joints fused  . JOINT REPLACEMENT Right 04/11/2009  . LAPAROSCOPIC LYSIS OF ADHESIONS N/A  02/22/2019   Procedure: LAPAROSCOPIC LYSIS OF ADHESIONS;  Surgeon: Kieth Brightly Arta Bruce, MD;  [facility];  Service: General;  Laterality: N/A;  . PROSTATECTOMY  03/2007  . RIGHT/LEFT HEART CATH AND CORONARY ANGIOGRAPHY N/A 09/27/2019   Procedure: RIGHT/LEFT HEART CATH AND CORONARY ANGIOGRAPHY;  Surgeon: Jolaine Artist, MD;  Location: [place] [facility];  Service: Cardiovascular;  Laterality: N/A;  . TOTAL KNEE ARTHROPLASTY  04/2009  . TOTAL KNEE ARTHROPLASTY Left 12/19/2019   Procedure: TOTAL KNEE ARTHROPLASTY;  Surgeon: Gaynelle Arabian, MD;  [facility];  Service: Orthopedics;  Laterality: Left;  50 mins    There were no vitals filed for this visit.   Subjective Assessment - 12/27/19 1143    Subjective Pain and achy,Just feels realty tight    Currently in Pain? Yes    Pain Score 3     Pain Location Knee    Pain Orientation Left                             [facility] Adult PT Treatment/Exercise - 12/27/19 0001      Knee/Hip Exercises: Aerobic   Nustep L4 x 6 min      Knee/Hip Exercises: Standing   Other Standing Knee Exercises Standing march UE support 2x10       Knee/Hip Exercises: Seated  Long Arc [employer] Left;10 reps;3 sets    [employer] Weight 2 lbs.    Other Seated Knee/Hip Exercises Quad sets LLE x10    Other Seated Knee/Hip Exercises SAQ LLE 2x10     Hamstring Curl Left;2 sets;10 reps    Hamstring Limitations Green Tband       Manual Therapy   Manual Therapy Passive ROM    Manual therapy comments Some taken to end range and held     Passive ROM L knee flex and ext                    PT Short Term Goals - 12/27/19 1220      PT SHORT TERM GOAL #1   Title Pt will be independent with HEP    Status Achieved             PT Long Term Goals - 12/22/19 1309      PT LONG TERM GOAL #1   Title Pt will demo L knee flexion 110 deg    Baseline 85    Time 8    Period Weeks    Status New    Target Date 02/16/20      PT LONG  TERM GOAL #2   Title Pt will demo L knee extension 0 deg    Baseline -20    Time 8    Period Weeks    Status New    Target Date 02/16/20      PT LONG TERM GOAL #3   Title Pt will demo TUG time <15 sec with no AD    Baseline 30 sec with RW    Time 8    Period Weeks    Status New    Target Date 02/16/20      PT LONG TERM GOAL #4   Title Pt will demo decrease in circumferential edema to <2 cm difference b/n R and L    Time 8    Period Weeks    Status New    Target Date 02/16/20                 Plan - 12/27/19 1217    Clinical Impression Statement Pt did well for his first session after eval. Some pain and guarding noted with MT requiring multiple cues to relax. Soft end feel with passive L knee flexion his limiting factor is pain. Decrease TKE noted on LLE with marching with RLE.  Some instability of L hip noted with HS curls, hip would ER/IR during curls. Good quad contraction with quad sets. Some initial pain with palpation to L knee that lessened as therapy progressed. Pt denied ice post treatment, elected to ice at home    Personal Factors and Comorbidities Comorbidity 2    Comorbidities arthritis, HTN    Examination-Activity Limitations Lift;Locomotion Level;Sit;Squat;Stairs;Stand    Examination-Participation Restrictions Community Activity;Interpersonal Relationship    Stability/Clinical Decision Making Evolving/Moderate complexity    Rehab Potential Good    PT Frequency 2x / week    PT Duration 8 weeks    PT Treatment/Interventions ADLs/Self Care Home Management;Cryotherapy;Electrical Stimulation;Gait training;Stair training;Functional mobility training;Therapeutic activities;Therapeutic exercise;Neuromuscular re-education;Manual techniques;Patient/family education;Scar mobilization;Passive range of motion;Vasopneumatic Device    PT Next Visit Plan ROM and function           Patient will benefit from skilled therapeutic intervention in order to improve the  following deficits and impairments:  Abnormal gait, Decreased range of motion, Difficulty walking, Pain, Hypomobility, Impaired  flexibility, Decreased mobility, Decreased strength, Increased edema  Visit Diagnosis: Stiffness of left knee, not elsewhere classified  Difficulty in walking, not elsewhere classified  Localized edema  Acute pain of left knee     Problem List Patient Active Problem List   Diagnosis Date Noted  . OA (osteoarthritis) of knee 12/19/2019  . Primary osteoarthritis of left knee 12/19/2019  . Small bowel obstruction ([place]) 02/23/2019  . Respiratory distress following surgery 09/24/2017  . Cervical spondylosis with radiculopathy 09/22/2017  . Viral URI with cough 04/01/2016  . Chest pain, unspecified   . HTN (hypertension) 01/13/2013  . Dyslipidemia 01/13/2013  . CAD (coronary artery disease) 01/13/2013    Scot Jun, PTA 12/27/2019, 12:22 PM  [place], [place], [address] Phone: [phone]   Fax:  [phone]  Name: IZRAEL PEAK MRN: [id number] Date of Birth: 1951/02/07

## 2019-12-29 ENCOUNTER — Ambulatory Visit: Payer: Medicare Other | Admitting: Physical Therapy

## 2019-12-29 ENCOUNTER — Other Ambulatory Visit: Payer: Self-pay

## 2019-12-29 ENCOUNTER — Encounter: Payer: Self-pay | Admitting: Physical Therapy

## 2019-12-29 DIAGNOSIS — M25662 Stiffness of left knee, not elsewhere classified: Secondary | ICD-10-CM

## 2019-12-29 DIAGNOSIS — M25562 Pain in left knee: Secondary | ICD-10-CM

## 2019-12-29 DIAGNOSIS — R262 Difficulty in walking, not elsewhere classified: Secondary | ICD-10-CM

## 2019-12-29 DIAGNOSIS — R600 Localized edema: Secondary | ICD-10-CM | POA: Diagnosis not present

## 2019-12-29 NOTE — Therapy (Signed)
[place], [place], [address] Phone: [phone]   Fax:  [phone]  Physical Therapy Treatment  Patient Details  Name: Charles Oconnor MRN: [id number] Date of Birth: 1950-09-06 Referring Provider (PT): Alusio   Encounter Date: 12/29/2019   PT End of Session - 12/29/19 1341    Visit Number 3    Date for PT Re-Evaluation 02/21/20    PT Start Time 1300    PT Stop Time 1342    PT Time Calculation (min) 42 min    Activity Tolerance Patient tolerated treatment well    Behavior During Therapy [facility] for tasks assessed/performed           Past Medical History:  Diagnosis Date  . Arthritis    Back knees  . Cervical spinal stenosis   . Complication of anesthesia   . Coronary artery disease    mild to moderate  . Dyslipidemia   . Dyspnea   . ED (erectile dysfunction)   . GERD (gastroesophageal reflux disease)   . Hypertension   . PONV (postoperative nausea and vomiting)    Happened once 30 yrs ago. No problems since.  . Prostate cancer [facility])     Past Surgical History:  Procedure Laterality Date  . ANTERIOR CERVICAL DECOMPRESSION/DISCECTOMY FUSION 4 LEVELS N/A 09/22/2017   Procedure: ACDF - C3-C4 - C4-C5 - C5-C6 - C6-C7;  Surgeon: Earnie Larsson, MD;  Location: [place];  Service: Neurosurgery;  Laterality: N/A;  . APPENDECTOMY  1985  . CARDIAC CATHETERIZATION  2002, 2011   mild-mod CAD  . CARDIAC CATHETERIZATION N/A 03/12/2015   Procedure: Left Heart Cath and Coronary Angiography;  Surgeon: Pixie Casino, MD;  Location: [place] [facility];  Service: Cardiovascular;  Laterality: N/A;  . COLON RESECTION N/A 02/22/2019   Procedure: LAPAROSCOPIC COLON RESECTION AND DIVERTICULECTOMY;  Surgeon: Kieth Brightly, Arta Bruce, MD;  [facility];  Service: General;  Laterality: N/A;  . HAND SURGERY Left    had joints fused  . JOINT REPLACEMENT Right 04/11/2009  . LAPAROSCOPIC LYSIS OF ADHESIONS N/A  02/22/2019   Procedure: LAPAROSCOPIC LYSIS OF ADHESIONS;  Surgeon: Kieth Brightly Arta Bruce, MD;  [facility];  Service: General;  Laterality: N/A;  . PROSTATECTOMY  03/2007  . RIGHT/LEFT HEART CATH AND CORONARY ANGIOGRAPHY N/A 09/27/2019   Procedure: RIGHT/LEFT HEART CATH AND CORONARY ANGIOGRAPHY;  Surgeon: Jolaine Artist, MD;  Location: [place] [facility];  Service: Cardiovascular;  Laterality: N/A;  . TOTAL KNEE ARTHROPLASTY  04/2009  . TOTAL KNEE ARTHROPLASTY Left 12/19/2019   Procedure: TOTAL KNEE ARTHROPLASTY;  Surgeon: Gaynelle Arabian, MD;  [facility];  Service: Orthopedics;  Laterality: Left;  50 mins    There were no vitals filed for this visit.   Subjective Assessment - 12/29/19 1302    Subjective Felt better after last session. Having some pain today but has not taken any pain med's in a few days. Don't want any complications that come alone with med's.    Currently in Pain? Yes    Pain Score 8     Pain Location Knee    Pain Orientation Left              [facility] PT Assessment - 12/29/19 0001      PROM   Left Knee Flexion 105                         [facility]  Adult PT Treatment/Exercise - 12/29/19 0001      Knee/Hip Exercises: Aerobic   Nustep L4 x 6 min      Knee/Hip Exercises: Machines for Strengthening   [employer] Leg Press 20lb 2x10, NO weight LLE, 20lb LLE TKE 2x5        Knee/Hip Exercises: Seated   Long Arc Quad Left;10 reps;3 sets    [employer] Weight 2 lbs.    Other Seated Knee/Hip Exercises Quad sets LLE x10    Other Seated Knee/Hip Exercises SAQ LLE x10     Hamstring Curl Left;2 sets;15 reps    Hamstring Limitations Green Tband       Knee/Hip Exercises: Supine   Short Arc Quad Sets Left;2 sets;10 reps    Short Arc Quad Sets Limitations 2    Straight Leg Raises Left;2 sets;10 reps    Straight Leg Raises Limitations 2      Manual Therapy   Manual Therapy Passive ROM    Manual therapy comments Some taken to end range and held      Passive ROM L knee flex and ext                    PT Short Term Goals - 12/27/19 1220      PT SHORT TERM GOAL #1   Title Pt will be independent with HEP    Status Achieved             PT Long Term Goals - 12/22/19 1309      PT LONG TERM GOAL #1   Title Pt will demo L knee flexion 110 deg    Baseline 85    Time 8    Period Weeks    Status New    Target Date 02/16/20      PT LONG TERM GOAL #2   Title Pt will demo L knee extension 0 deg    Baseline -20    Time 8    Period Weeks    Status New    Target Date 02/16/20      PT LONG TERM GOAL #3   Title Pt will demo TUG time <15 sec with no AD    Baseline 30 sec with RW    Time 8    Period Weeks    Status New    Target Date 02/16/20      PT LONG TERM GOAL #4   Title Pt will demo decrease in circumferential edema to <2 cm difference b/n R and L    Time 8    Period Weeks    Status New    Target Date 02/16/20                 Plan - 12/29/19 1341    Clinical Impression Statement Pt did well overall today. Progressed to leg press strengthening without issue. Pt has some edema noted in L knee. Tenderness and pain noted with palpation to L knee. AROM has improved but he is lacking extension. TKE emphasized throughout session with the interventions. Denied ice post treatment.    Personal Factors and Comorbidities Comorbidity 2    Comorbidities arthritis, HTN    Examination-Activity Limitations Lift;Locomotion Level;Sit;Squat;Stairs;Stand    Examination-Participation Restrictions Community Activity;Interpersonal Relationship    Stability/Clinical Decision Making Evolving/Moderate complexity    Rehab Potential Good    PT Frequency 2x / week    PT Duration 8 weeks    PT Treatment/Interventions ADLs/Self Care Home Management;Cryotherapy;Electrical Stimulation;Gait training;Stair  training;Functional mobility training;Therapeutic activities;Therapeutic exercise;Neuromuscular re-education;Manual  techniques;Patient/family education;Scar mobilization;Passive range of motion;Vasopneumatic Device    PT Next Visit Plan ROM and function           Patient will benefit from skilled therapeutic intervention in order to improve the following deficits and impairments:  Abnormal gait, Decreased range of motion, Difficulty walking, Pain, Hypomobility, Impaired flexibility, Decreased mobility, Decreased strength, Increased edema  Visit Diagnosis: Difficulty in walking, not elsewhere classified  Localized edema  Acute pain of left knee  Stiffness of left knee, not elsewhere classified     Problem List Patient Active Problem List   Diagnosis Date Noted  . OA (osteoarthritis) of knee 12/19/2019  . Primary osteoarthritis of left knee 12/19/2019  . Small bowel obstruction ([place]) 02/23/2019  . Respiratory distress following surgery 09/24/2017  . Cervical spondylosis with radiculopathy 09/22/2017  . Viral URI with cough 04/01/2016  . Chest pain, unspecified   . HTN (hypertension) 01/13/2013  . Dyslipidemia 01/13/2013  . CAD (coronary artery disease) 01/13/2013    Scot Jun, PTA 12/29/2019, 1:44 PM  [place], [place], [address] Phone: [phone]   Fax:  [phone]  Name: KAEDAN RICHERT MRN: [id number] Date of Birth: 02-Nov-1950

## 2020-01-02 ENCOUNTER — Ambulatory Visit: Payer: Medicare Other | Admitting: Physical Therapy

## 2020-01-02 ENCOUNTER — Encounter: Payer: Self-pay | Admitting: Physical Therapy

## 2020-01-02 ENCOUNTER — Other Ambulatory Visit: Payer: Self-pay

## 2020-01-02 VITALS — BP 130/80

## 2020-01-02 DIAGNOSIS — M25662 Stiffness of left knee, not elsewhere classified: Secondary | ICD-10-CM | POA: Diagnosis not present

## 2020-01-02 DIAGNOSIS — M25562 Pain in left knee: Secondary | ICD-10-CM

## 2020-01-02 DIAGNOSIS — R262 Difficulty in walking, not elsewhere classified: Secondary | ICD-10-CM

## 2020-01-02 DIAGNOSIS — R600 Localized edema: Secondary | ICD-10-CM | POA: Diagnosis not present

## 2020-01-02 NOTE — Therapy (Signed)
[place], [place], [address] Phone: [phone]   Fax:  [phone]  Physical Therapy Treatment  Patient Details  Name: Charles Oconnor MRN: [id number] Date of Birth: May 13, 1950 Referring Provider (PT): Alusio   Encounter Date: 01/02/2020   PT End of Session - 01/02/20 1703    Visit Number 4    Date for PT Re-Evaluation 02/21/20    PT Start Time 1615    PT Stop Time 1656    PT Time Calculation (min) 41 min    Activity Tolerance Patient tolerated treatment well    Behavior During Therapy [facility] for tasks assessed/performed           Past Medical History:  Diagnosis Date   Arthritis    Back knees   Cervical spinal stenosis    Complication of anesthesia    Coronary artery disease    mild to moderate   Dyslipidemia    Dyspnea    ED (erectile dysfunction)    GERD (gastroesophageal reflux disease)    Hypertension    PONV (postoperative nausea and vomiting)    Happened once 30 yrs ago. No problems since.   Prostate cancer [facility])     Past Surgical History:  Procedure Laterality Date   ANTERIOR CERVICAL DECOMPRESSION/DISCECTOMY FUSION 4 LEVELS N/A 09/22/2017   Procedure: ACDF - C3-C4 - C4-C5 - C5-C6 - C6-C7;  Surgeon: Earnie Larsson, MD;  Location: [place];  Service: Neurosurgery;  Laterality: N/A;   APPENDECTOMY  1985   CARDIAC CATHETERIZATION  2002, 2011   mild-mod CAD   CARDIAC CATHETERIZATION N/A 03/12/2015   Procedure: Left Heart Cath and Coronary Angiography;  Surgeon: Pixie Casino, MD;  Location: [place] [facility];  Service: Cardiovascular;  Laterality: N/A;   COLON RESECTION N/A 02/22/2019   Procedure: LAPAROSCOPIC COLON RESECTION AND DIVERTICULECTOMY;  Surgeon: Kieth Brightly, Arta Bruce, MD;  [facility];  Service: General;  Laterality: N/A;   HAND SURGERY Left    had joints fused   JOINT REPLACEMENT Right 04/11/2009   LAPAROSCOPIC LYSIS OF ADHESIONS N/A  02/22/2019   Procedure: LAPAROSCOPIC LYSIS OF ADHESIONS;  Surgeon: Mickeal Skinner, MD;  [facility];  Service: General;  Laterality: N/A;   PROSTATECTOMY  03/2007   RIGHT/LEFT HEART CATH AND CORONARY ANGIOGRAPHY N/A 09/27/2019   Procedure: RIGHT/LEFT HEART CATH AND CORONARY ANGIOGRAPHY;  Surgeon: Jolaine Artist, MD;  Location: [place] [facility];  Service: Cardiovascular;  Laterality: N/A;   TOTAL KNEE ARTHROPLASTY  04/2009   TOTAL KNEE ARTHROPLASTY Left 12/19/2019   Procedure: TOTAL KNEE ARTHROPLASTY;  Surgeon: Gaynelle Arabian, MD;  [facility];  Service: Orthopedics;  Laterality: Left;  50 mins    Vitals:   01/02/20 1626  BP: 130/80     Subjective Assessment - 01/02/20 1626    Subjective Pt reports feeling a little light headed when he got out of the car. Denies N/T. States he has had plenty of water. Feeling a little better now.    Currently in Pain? Yes    Pain Score 6     Pain Location Knee    Pain Orientation Left              [facility] PT Assessment - 01/02/20 0001      Assessment   Next MD Visit 01/03/2020      Circumferential Edema   Circumferential - Right 43 cm    Circumferential - Left  45 cm mid  patella      AROM   Left Knee Extension 10    Left Knee Flexion 110      PROM   Left Knee Extension 8    Left Knee Flexion 112      Timed Up and Go Test   Normal TUG (seconds) 10.82    TUG Comments with RW                         [facility] Adult PT Treatment/Exercise - 01/02/20 0001      Knee/Hip Exercises: Aerobic   Nustep L5 x 7 min      Knee/Hip Exercises: Machines for Strengthening   Total Gym Leg Press 20lb 2x10, NO weight LLE, 20lb LLE TKE 2x5        Knee/Hip Exercises: Standing   Heel Raises Both;1 set;10 reps    Other Standing Knee Exercises Standing march UE support 2x10       Knee/Hip Exercises: Seated   Long Arc Quad Left;10 reps;3 sets    Long Arc Quad Weight 2 lbs.    Hamstring Curl Left;3 sets;10 reps     Hamstring Limitations Franchot Mimes                     PT Short Term Goals - 12/27/19 1220      PT SHORT TERM GOAL #1   Title Pt will be independent with HEP    Status Achieved             PT Long Term Goals - 01/02/20 1705      PT LONG TERM GOAL #1   Title Pt will demo L knee flexion 110 deg    Baseline 110    Time 8    Period Weeks    Status Achieved      PT LONG TERM GOAL #2   Title Pt will demo L knee extension 0 deg    Baseline -8    Time 8    Period Weeks    Status On-going      PT LONG TERM GOAL #3   Title Pt will demo TUG time <15 sec with no AD    Baseline 10 sec with RW    Time 8    Period Weeks    Status On-going      PT LONG TERM GOAL #4   Title Pt will demo decrease in circumferential edema to <2 cm difference b/n R and L    Baseline 3 cm diff    Time 8    Period Weeks    Status Partially Met                 Plan - 01/02/20 1703    Clinical Impression Statement Pt tolerated progression of TE well. Circumferential edema decreased from time of eval; however, edema posteriorly still present and tenderness/pain with palpation to L knee. Pt able to attain 112 deg flexion and 8 deg extension passively after manual stretching. Pt denied ice post tx; prefers to ice at home.    PT Treatment/Interventions ADLs/Self Care Home Management;Cryotherapy;Electrical Stimulation;Gait training;Stair training;Functional mobility training;Therapeutic activities;Therapeutic exercise;Neuromuscular re-education;Manual techniques;Patient/family education;Scar mobilization;Passive range of motion;Vasopneumatic Device    PT Next Visit Plan ROM and function    Consulted and Agree with Plan of Care Patient           Patient will benefit from skilled therapeutic intervention in order to improve the following deficits and impairments:  Abnormal gait, Decreased range of motion, Difficulty walking, Pain, Hypomobility, Impaired flexibility, Decreased mobility,  Decreased strength, Increased edema  Visit Diagnosis: Difficulty in walking, not elsewhere classified  Localized edema  Acute pain of left knee  Stiffness of left knee, not elsewhere classified     Problem List Patient Active Problem List   Diagnosis Date Noted   OA (osteoarthritis) of knee 12/19/2019   Primary osteoarthritis of left knee 12/19/2019   Small bowel obstruction ([place]) 02/23/2019   Respiratory distress following surgery 09/24/2017   Cervical spondylosis with radiculopathy 09/22/2017   Viral URI with cough 04/01/2016   Chest pain, unspecified    HTN (hypertension) 01/13/2013   Dyslipidemia 01/13/2013   CAD (coronary artery disease) 01/13/2013   Amador Cunas, PT, DPT Donald Prose Korryn Pancoast 01/02/2020, 5:06 PM  [place], [place], [address] Phone: [phone]   Fax:  [phone]  Name: Charles Oconnor MRN: [id number] Date of Birth: 08-Nov-1950

## 2020-01-04 ENCOUNTER — Encounter: Payer: Self-pay | Admitting: Physical Therapy

## 2020-01-04 ENCOUNTER — Ambulatory Visit: Payer: Medicare Other | Admitting: Physical Therapy

## 2020-01-04 ENCOUNTER — Other Ambulatory Visit: Payer: Self-pay

## 2020-01-04 DIAGNOSIS — M25662 Stiffness of left knee, not elsewhere classified: Secondary | ICD-10-CM

## 2020-01-04 DIAGNOSIS — M25562 Pain in left knee: Secondary | ICD-10-CM | POA: Diagnosis not present

## 2020-01-04 DIAGNOSIS — R600 Localized edema: Secondary | ICD-10-CM | POA: Diagnosis not present

## 2020-01-04 DIAGNOSIS — R262 Difficulty in walking, not elsewhere classified: Secondary | ICD-10-CM

## 2020-01-04 NOTE — Therapy (Signed)
[place], [place], [address] Phone: [phone]   Fax:  [phone]  Physical Therapy Treatment  Patient Details  Name: Charles Oconnor MRN: [id number] Date of Birth: 27-May-1950 Referring Provider (PT): Alusio   Encounter Date: 01/04/2020   PT End of Session - 01/04/20 1348    Visit Number 5    Date for PT Re-Evaluation 02/21/20    PT Start Time 1300    PT Stop Time 1356    PT Time Calculation (min) 56 min    Activity Tolerance Patient tolerated treatment well    Behavior During Therapy [facility] for tasks assessed/performed           Past Medical History:  Diagnosis Date  . Arthritis    Back knees  . Cervical spinal stenosis   . Complication of anesthesia   . Coronary artery disease    mild to moderate  . Dyslipidemia   . Dyspnea   . ED (erectile dysfunction)   . GERD (gastroesophageal reflux disease)   . Hypertension   . PONV (postoperative nausea and vomiting)    Happened once 30 yrs ago. No problems since.  . Prostate cancer [facility])     Past Surgical History:  Procedure Laterality Date  . ANTERIOR CERVICAL DECOMPRESSION/DISCECTOMY FUSION 4 LEVELS N/A 09/22/2017   Procedure: ACDF - C3-C4 - C4-C5 - C5-C6 - C6-C7;  Surgeon: Earnie Larsson, MD;  Location: [place];  Service: Neurosurgery;  Laterality: N/A;  . APPENDECTOMY  1985  . CARDIAC CATHETERIZATION  2002, 2011   mild-mod CAD  . CARDIAC CATHETERIZATION N/A 03/12/2015   Procedure: Left Heart Cath and Coronary Angiography;  Surgeon: Pixie Casino, MD;  Location: [place] [facility];  Service: Cardiovascular;  Laterality: N/A;  . COLON RESECTION N/A 02/22/2019   Procedure: LAPAROSCOPIC COLON RESECTION AND DIVERTICULECTOMY;  Surgeon: Kieth Brightly, Arta Bruce, MD;  [facility];  Service: General;  Laterality: N/A;  . HAND SURGERY Left    had joints fused  . JOINT REPLACEMENT Right 04/11/2009  . LAPAROSCOPIC LYSIS OF ADHESIONS N/A  02/22/2019   Procedure: LAPAROSCOPIC LYSIS OF ADHESIONS;  Surgeon: Kieth Brightly Arta Bruce, MD;  [facility];  Service: General;  Laterality: N/A;  . PROSTATECTOMY  03/2007  . RIGHT/LEFT HEART CATH AND CORONARY ANGIOGRAPHY N/A 09/27/2019   Procedure: RIGHT/LEFT HEART CATH AND CORONARY ANGIOGRAPHY;  Surgeon: Jolaine Artist, MD;  Location: [place] [facility];  Service: Cardiovascular;  Laterality: N/A;  . TOTAL KNEE ARTHROPLASTY  04/2009  . TOTAL KNEE ARTHROPLASTY Left 12/19/2019   Procedure: TOTAL KNEE ARTHROPLASTY;  Surgeon: Gaynelle Arabian, MD;  [facility];  Service: Orthopedics;  Laterality: Left;  50 mins    There were no vitals filed for this visit.   Subjective Assessment - 01/04/20 1302    Subjective Went to the doctor yesterday he said I was 6 to 8 weeks ahead.    Currently in Pain? Yes    Pain Score 1     Pain Location Knee    Pain Orientation Left                             [facility] Adult PT Treatment/Exercise - 01/04/20 0001      Knee/Hip Exercises: Aerobic   Recumbent Bike L0 x 3 min    Nustep L5 x 5 min NoUE      Knee/Hip Exercises: Machines for Strengthening  Cybex Knee Extension 10lb 2x10, LLE 5lb 2x10     Cybex Knee Flexion 20lb 2x10, LLE 15lb 2x10    Total Gym Leg Press 30lb 2x10, 20lb LLE TKE 2x10        Knee/Hip Exercises: Standing   Walking with Sports Cord 30lb frwd/bak x3 each       Knee/Hip Exercises: Seated   Sit to Sand 2 sets;10 reps;without UE support   UBE     Knee/Hip Exercises: Supine   Short Arc Quad Sets Left;2 sets;10 reps    Short Arc Quad Sets Limitations 3      Modalities   Modalities Vasopneumatic      Vasopneumatic   Number Minutes Vasopneumatic  10 minutes    Vasopnuematic Location  Knee    Vasopneumatic Pressure Low    Vasopneumatic Temperature  33      Manual Therapy   Manual Therapy Passive ROM    Manual therapy comments Some taken to end range and held     Passive ROM L knee flex and ext                      PT Short Term Goals - 12/27/19 1220      PT SHORT TERM GOAL #1   Title Pt will be independent with HEP    Status Achieved             PT Long Term Goals - 01/02/20 1705      PT LONG TERM GOAL #1   Title Pt will demo L knee flexion 110 deg    Baseline 110    Time 8    Period Weeks    Status Achieved      PT LONG TERM GOAL #2   Title Pt will demo L knee extension 0 deg    Baseline -8    Time 8    Period Weeks    Status On-going      PT LONG TERM GOAL #3   Title Pt will demo TUG time <15 sec with no AD    Baseline 10 sec with RW    Time 8    Period Weeks    Status On-going      PT LONG TERM GOAL #4   Title Pt will demo decrease in circumferential edema to <2 cm difference b/n R and L    Baseline 3 cm diff    Time 8    Period Weeks    Status Partially Met                 Plan - 01/04/20 1350    Clinical Impression Statement Pt did well today. He still complains of tenderness around L patella with palpation. Progressed to some machine level interventions with SL strengthening. He has limited L knee extension causing decrease stance time on LLE with gait.    Personal Factors and Comorbidities Comorbidity 2    Comorbidities arthritis, HTN    Examination-Activity Limitations Lift;Locomotion Level;Sit;Squat;Stairs;Stand    Examination-Participation Restrictions Community Activity;Interpersonal Relationship    Stability/Clinical Decision Making Evolving/Moderate complexity    Rehab Potential Good    PT Frequency 2x / week    PT Duration 8 weeks    PT Treatment/Interventions ADLs/Self Care Home Management;Cryotherapy;Electrical Stimulation;Gait training;Stair training;Functional mobility training;Therapeutic activities;Therapeutic exercise;Neuromuscular re-education;Manual techniques;Patient/family education;Scar mobilization;Passive range of motion;Vasopneumatic Device    PT Next Visit Plan ROM and function           Patient will  benefit from skilled therapeutic intervention in order to improve the following deficits and impairments:  Abnormal gait, Decreased range of motion, Difficulty walking, Pain, Hypomobility, Impaired flexibility, Decreased mobility, Decreased strength, Increased edema  Visit Diagnosis: Acute pain of left knee  Stiffness of left knee, not elsewhere classified  Localized edema  Difficulty in walking, not elsewhere classified     Problem List Patient Active Problem List   Diagnosis Date Noted  . OA (osteoarthritis) of knee 12/19/2019  . Primary osteoarthritis of left knee 12/19/2019  . Small bowel obstruction ([place]) 02/23/2019  . Respiratory distress following surgery 09/24/2017  . Cervical spondylosis with radiculopathy 09/22/2017  . Viral URI with cough 04/01/2016  . Chest pain, unspecified   . HTN (hypertension) 01/13/2013  . Dyslipidemia 01/13/2013  . CAD (coronary artery disease) 01/13/2013    Charles Oconnor, PTA 01/04/2020, 1:57 PM  [place], [place], [address] Phone: [phone]   Fax:  [phone]  Name: Charles Oconnor MRN: [id number] Date of Birth: [birth year]

## 2020-01-10 ENCOUNTER — Ambulatory Visit: Payer: Medicare Other | Admitting: Physical Therapy

## 2020-01-10 ENCOUNTER — Encounter: Payer: Self-pay | Admitting: Physical Therapy

## 2020-01-10 ENCOUNTER — Other Ambulatory Visit: Payer: Self-pay

## 2020-01-10 DIAGNOSIS — R262 Difficulty in walking, not elsewhere classified: Secondary | ICD-10-CM | POA: Diagnosis not present

## 2020-01-10 DIAGNOSIS — M25662 Stiffness of left knee, not elsewhere classified: Secondary | ICD-10-CM

## 2020-01-10 DIAGNOSIS — R600 Localized edema: Secondary | ICD-10-CM | POA: Diagnosis not present

## 2020-01-10 DIAGNOSIS — M25562 Pain in left knee: Secondary | ICD-10-CM | POA: Diagnosis not present

## 2020-01-10 NOTE — Therapy (Signed)
[place], [place], [address] Phone: [phone]   Fax:  [phone]  Physical Therapy Treatment  Patient Details  Name: Charles Oconnor MRN: [id number] Date of Birth: 1950-08-31 Referring Provider (PT): Alusio   Encounter Date: 01/10/2020   PT End of Session - 01/10/20 1139    Visit Number 6    Date for PT Re-Evaluation 02/21/20    PT Start Time 1100    PT Stop Time 1150    PT Time Calculation (min) 50 min    Activity Tolerance Patient tolerated treatment well    Behavior During Therapy [facility] for tasks assessed/performed           Past Medical History:  Diagnosis Date  . Arthritis    Back knees  . Cervical spinal stenosis   . Complication of anesthesia   . Coronary artery disease    mild to moderate  . Dyslipidemia   . Dyspnea   . ED (erectile dysfunction)   . GERD (gastroesophageal reflux disease)   . Hypertension   . PONV (postoperative nausea and vomiting)    Happened once 30 yrs ago. No problems since.  . Prostate cancer [facility])     Past Surgical History:  Procedure Laterality Date  . ANTERIOR CERVICAL DECOMPRESSION/DISCECTOMY FUSION 4 LEVELS N/A 09/22/2017   Procedure: ACDF - C3-C4 - C4-C5 - C5-C6 - C6-C7;  Surgeon: Earnie Larsson, MD;  Location: [place];  Service: Neurosurgery;  Laterality: N/A;  . APPENDECTOMY  1985  . CARDIAC CATHETERIZATION  2002, 2011   mild-mod CAD  . CARDIAC CATHETERIZATION N/A 03/12/2015   Procedure: Left Heart Cath and Coronary Angiography;  Surgeon: Pixie Casino, MD;  Location: [place] [facility];  Service: Cardiovascular;  Laterality: N/A;  . COLON RESECTION N/A 02/22/2019   Procedure: LAPAROSCOPIC COLON RESECTION AND DIVERTICULECTOMY;  Surgeon: Kieth Brightly, Arta Bruce, MD;  [facility];  Service: General;  Laterality: N/A;  . HAND SURGERY Left    had joints fused  . JOINT REPLACEMENT Right 04/11/2009  . LAPAROSCOPIC LYSIS OF ADHESIONS N/A  02/22/2019   Procedure: LAPAROSCOPIC LYSIS OF ADHESIONS;  Surgeon: Kieth Brightly Arta Bruce, MD;  [facility];  Service: General;  Laterality: N/A;  . PROSTATECTOMY  03/2007  . RIGHT/LEFT HEART CATH AND CORONARY ANGIOGRAPHY N/A 09/27/2019   Procedure: RIGHT/LEFT HEART CATH AND CORONARY ANGIOGRAPHY;  Surgeon: Jolaine Artist, MD;  Location: [place] [facility];  Service: Cardiovascular;  Laterality: N/A;  . TOTAL KNEE ARTHROPLASTY  04/2009  . TOTAL KNEE ARTHROPLASTY Left 12/19/2019   Procedure: TOTAL KNEE ARTHROPLASTY;  Surgeon: Gaynelle Arabian, MD;  [facility];  Service: Orthopedics;  Laterality: Left;  50 mins    There were no vitals filed for this visit.   Subjective Assessment - 01/10/20 1059    Subjective "Doing pretty good, been working on the straightening hard"    Currently in Pain? Yes    Pain Score 4     Pain Location Knee    Pain Orientation Left    Pain Descriptors / Indicators Aching                             [facility] Adult PT Treatment/Exercise - 01/10/20 0001      Knee/Hip Exercises: Aerobic   Elliptical I7 R4 x3 min     Nustep L5 x 5 min NoUE      Knee/Hip Exercises: Machines  for Strengthening   Cybex Knee Extension 15lb 2x10, RLE 5lb 210     Cybex Knee Flexion 35lb x15, LLE 15lb 2x10    Total Gym Leg Press 40lb 2x10 LLE 20lb 3x5      Knee/Hip Exercises: Standing   Heel Raises Both;2 sets;10 reps;2 seconds    Walking with Sports Cord 40lb 4 way x3 each       Modalities   Modalities Vasopneumatic      Vasopneumatic   Number Minutes Vasopneumatic  10 minutes    Vasopnuematic Location  Knee    Vasopneumatic Pressure Low    Vasopneumatic Temperature  33      Manual Therapy   Manual Therapy Passive ROM    Manual therapy comments Some taken to end range and held     Passive ROM L knee flex and ext                    PT Short Term Goals - 12/27/19 1220      PT SHORT TERM GOAL #1   Title Pt will be independent with HEP     Status Achieved             PT Long Term Goals - 01/02/20 1705      PT LONG TERM GOAL #1   Title Pt will demo L knee flexion 110 deg    Baseline 110    Time 8    Period Weeks    Status Achieved      PT LONG TERM GOAL #2   Title Pt will demo L knee extension 0 deg    Baseline -8    Time 8    Period Weeks    Status On-going      PT LONG TERM GOAL #3   Title Pt will demo TUG time <15 sec with no AD    Baseline 10 sec with RW    Time 8    Period Weeks    Status On-going      PT LONG TERM GOAL #4   Title Pt will demo decrease in circumferential edema to <2 cm difference b/n R and L    Baseline 3 cm diff    Time 8    Period Weeks    Status Partially Met                 Plan - 01/10/20 1139    Clinical Impression Statement Pt anb in clinic without Ad and decrease L knee TKE. He did well with all the exercise interventions. Some struggle with SL on leg press. Some compensation noted with step ups, cues needed to push through LLE instead of using the RLE to hop up. Some soreness around the knee cap noted with MT.    Personal Factors and Comorbidities Comorbidity 2    Comorbidities arthritis, HTN    Examination-Activity Limitations Lift;Locomotion Level;Sit;Squat;Stairs;Stand    Examination-Participation Restrictions Community Activity;Interpersonal Relationship    Stability/Clinical Decision Making Evolving/Moderate complexity    Rehab Potential Good    PT Frequency 2x / week    PT Duration 8 weeks    PT Treatment/Interventions ADLs/Self Care Home Management;Cryotherapy;Electrical Stimulation;Gait training;Stair training;Functional mobility training;Therapeutic activities;Therapeutic exercise;Neuromuscular re-education;Manual techniques;Patient/family education;Scar mobilization;Passive range of motion;Vasopneumatic Device    PT Next Visit Plan ROM and function           Patient will benefit from skilled therapeutic intervention in order to improve the  following deficits and impairments:  Abnormal gait, Decreased  range of motion, Difficulty walking, Pain, Hypomobility, Impaired flexibility, Decreased mobility, Decreased strength, Increased edema  Visit Diagnosis: Stiffness of left knee, not elsewhere classified  Localized edema  Difficulty in walking, not elsewhere classified  Acute pain of left knee     Problem List Patient Active Problem List   Diagnosis Date Noted  . OA (osteoarthritis) of knee 12/19/2019  . Primary osteoarthritis of left knee 12/19/2019  . Small bowel obstruction ([place]) 02/23/2019  . Respiratory distress following surgery 09/24/2017  . Cervical spondylosis with radiculopathy 09/22/2017  . Viral URI with cough 04/01/2016  . Chest pain, unspecified   . HTN (hypertension) 01/13/2013  . Dyslipidemia 01/13/2013  . CAD (coronary artery disease) 01/13/2013    Scot Jun, PTA 01/10/2020, 11:43 AM  [place], [place], [address] Phone: [phone]   Fax:  [phone]  Name: Charles Oconnor MRN: [id number] Date of Birth: [birth year]

## 2020-01-12 ENCOUNTER — Encounter: Payer: Self-pay | Admitting: Physical Therapy

## 2020-01-12 ENCOUNTER — Ambulatory Visit: Payer: Medicare Other | Attending: Orthopedic Surgery | Admitting: Physical Therapy

## 2020-01-12 ENCOUNTER — Other Ambulatory Visit: Payer: Self-pay

## 2020-01-12 DIAGNOSIS — M25562 Pain in left knee: Secondary | ICD-10-CM | POA: Diagnosis not present

## 2020-01-12 DIAGNOSIS — R262 Difficulty in walking, not elsewhere classified: Secondary | ICD-10-CM | POA: Diagnosis not present

## 2020-01-12 DIAGNOSIS — R600 Localized edema: Secondary | ICD-10-CM | POA: Diagnosis not present

## 2020-01-12 DIAGNOSIS — M25662 Stiffness of left knee, not elsewhere classified: Secondary | ICD-10-CM | POA: Insufficient documentation

## 2020-01-12 NOTE — Therapy (Signed)
[place], [place], [address] Phone: [phone]   Fax:  [phone]  Physical Therapy Treatment  Patient Details  Name: Charles Oconnor MRN: [id number] Date of Birth: 04-26-1951 Referring Provider (PT): Alusio   Encounter Date: 01/12/2020   PT End of Session - 01/12/20 1142    Visit Number 7    Date for PT Re-Evaluation 02/21/20    PT Start Time 1100    PT Stop Time 1151    PT Time Calculation (min) 51 min    Activity Tolerance Patient tolerated treatment well    Behavior During Therapy [facility] for tasks assessed/performed           Past Medical History:  Diagnosis Date  . Arthritis    Back knees  . Cervical spinal stenosis   . Complication of anesthesia   . Coronary artery disease    mild to moderate  . Dyslipidemia   . Dyspnea   . ED (erectile dysfunction)   . GERD (gastroesophageal reflux disease)   . Hypertension   . PONV (postoperative nausea and vomiting)    Happened once 30 yrs ago. No problems since.  . Prostate cancer [facility])     Past Surgical History:  Procedure Laterality Date  . ANTERIOR CERVICAL DECOMPRESSION/DISCECTOMY FUSION 4 LEVELS N/A 09/22/2017   Procedure: ACDF - C3-C4 - C4-C5 - C5-C6 - C6-C7;  Surgeon: Earnie Larsson, MD;  Location: [place];  Service: Neurosurgery;  Laterality: N/A;  . APPENDECTOMY  1985  . CARDIAC CATHETERIZATION  2002, 2011   mild-mod CAD  . CARDIAC CATHETERIZATION N/A 03/12/2015   Procedure: Left Heart Cath and Coronary Angiography;  Surgeon: Pixie Casino, MD;  Location: [place] [facility];  Service: Cardiovascular;  Laterality: N/A;  . COLON RESECTION N/A 02/22/2019   Procedure: LAPAROSCOPIC COLON RESECTION AND DIVERTICULECTOMY;  Surgeon: Kieth Brightly, Arta Bruce, MD;  [facility];  Service: General;  Laterality: N/A;  . HAND SURGERY Left    had joints fused  . JOINT REPLACEMENT Right 04/11/2009  . LAPAROSCOPIC LYSIS OF ADHESIONS N/A  02/22/2019   Procedure: LAPAROSCOPIC LYSIS OF ADHESIONS;  Surgeon: Kieth Brightly Arta Bruce, MD;  [facility];  Service: General;  Laterality: N/A;  . PROSTATECTOMY  03/2007  . RIGHT/LEFT HEART CATH AND CORONARY ANGIOGRAPHY N/A 09/27/2019   Procedure: RIGHT/LEFT HEART CATH AND CORONARY ANGIOGRAPHY;  Surgeon: Jolaine Artist, MD;  Location: [place] [facility];  Service: Cardiovascular;  Laterality: N/A;  . TOTAL KNEE ARTHROPLASTY  04/2009  . TOTAL KNEE ARTHROPLASTY Left 12/19/2019   Procedure: TOTAL KNEE ARTHROPLASTY;  Surgeon: Gaynelle Arabian, MD;  [facility];  Service: Orthopedics;  Laterality: Left;  50 mins    There were no vitals filed for this visit.   Subjective Assessment - 01/12/20 1100    Subjective Pt reports a little sore this morning    Currently in Pain? Yes    Pain Score 3     Pain Location Knee    Pain Orientation Left              [facility] PT Assessment - 01/12/20 0001      AROM   Left Knee Extension 10    Left Knee Flexion 119      PROM   Left Knee Extension 8    Left Knee Flexion 119  [facility] Adult PT Treatment/Exercise - 01/12/20 0001      Knee/Hip Exercises: Stretches   Gastroc Stretch Both;1 rep;30 seconds      Knee/Hip Exercises: Aerobic   Elliptical R9 x 4 min    Nustep L5 x 6 min      Knee/Hip Exercises: Machines for Strengthening   Cybex Knee Extension 15# 2x10 BLE; 10# 2x10 LLE    Cybex Knee Flexion 35lb 2x15, LLE 20lb 2x10    Total Gym Leg Press 50# BLE 2x10; 30# LLE 3x5      Knee/Hip Exercises: Standing   Heel Raises Both;2 sets;10 reps;2 seconds    Walking with Sports Cord 40lb 4 way x3 each       Vasopneumatic   Number Minutes Vasopneumatic  10 minutes    Vasopnuematic Location  Knee    Vasopneumatic Pressure Low    Vasopneumatic Temperature  33      Manual Therapy   Manual Therapy Passive ROM    Manual therapy comments Some taken to end range and held     Passive ROM L knee flex and  ext                    PT Short Term Goals - 12/27/19 1220      PT SHORT TERM GOAL #1   Title Pt will be independent with HEP    Status Achieved             PT Long Term Goals - 01/12/20 1143      PT LONG TERM GOAL #1   Title Pt will demo L knee flexion 110 deg    Baseline 119    Status Achieved      PT LONG TERM GOAL #2   Title Pt will demo L knee extension 0 deg    Baseline -8    Status Partially Met      PT LONG TERM GOAL #3   Title Pt will demo TUG time <15 sec with no AD      PT LONG TERM GOAL #4   Title Pt will demo decrease in circumferential edema to <2 cm difference b/n R and L    Status Partially Met                 Plan - 01/12/20 1142    Clinical Impression Statement Pt making good progress towards goals; able to attain 119 deg of active knee flexion after stretching this rx. Does demo some weakness on LLE compared to RLE during exercise. Able to tolerate increase in weight on machine interventions except for SL leg press.    PT Treatment/Interventions ADLs/Self Care Home Management;Cryotherapy;Electrical Stimulation;Gait training;Stair training;Functional mobility training;Therapeutic activities;Therapeutic exercise;Neuromuscular re-education;Manual techniques;Patient/family education;Scar mobilization;Passive range of motion;Vasopneumatic Device    PT Next Visit Plan ROM and function    Consulted and Agree with Plan of Care Patient           Patient will benefit from skilled therapeutic intervention in order to improve the following deficits and impairments:  Abnormal gait, Decreased range of motion, Difficulty walking, Pain, Hypomobility, Impaired flexibility, Decreased mobility, Decreased strength, Increased edema  Visit Diagnosis: Stiffness of left knee, not elsewhere classified  Localized edema  Difficulty in walking, not elsewhere classified  Acute pain of left knee     Problem List Patient Active Problem List    Diagnosis Date Noted  . OA (osteoarthritis) of knee 12/19/2019  . Primary osteoarthritis of left knee 12/19/2019  . Small  bowel obstruction ([place]) 02/23/2019  . Respiratory distress following surgery 09/24/2017  . Cervical spondylosis with radiculopathy 09/22/2017  . Viral URI with cough 04/01/2016  . Chest pain, unspecified   . HTN (hypertension) 01/13/2013  . Dyslipidemia 01/13/2013  . CAD (coronary artery disease) 01/13/2013   Amador Cunas, PT, DPT Donald Prose Yamila Cragin 01/12/2020, 11:45 AM  [place], [place], [address] Phone: [phone]   Fax:  [phone]  Name: Charles Oconnor MRN: [id number] Date of Birth: Nov 11, 1950

## 2020-01-17 ENCOUNTER — Ambulatory Visit: Payer: Medicare Other | Admitting: Physical Therapy

## 2020-01-17 ENCOUNTER — Encounter: Payer: Self-pay | Admitting: Physical Therapy

## 2020-01-17 ENCOUNTER — Other Ambulatory Visit: Payer: Self-pay

## 2020-01-17 DIAGNOSIS — R600 Localized edema: Secondary | ICD-10-CM | POA: Diagnosis not present

## 2020-01-17 DIAGNOSIS — M25562 Pain in left knee: Secondary | ICD-10-CM | POA: Diagnosis not present

## 2020-01-17 DIAGNOSIS — M25662 Stiffness of left knee, not elsewhere classified: Secondary | ICD-10-CM

## 2020-01-17 DIAGNOSIS — R262 Difficulty in walking, not elsewhere classified: Secondary | ICD-10-CM

## 2020-01-17 NOTE — Therapy (Signed)
[place], [place], [address] Phone: [phone]   Fax:  [phone]  Physical Therapy Treatment  Patient Details  Name: Charles Oconnor MRN: [id number] Date of Birth: 23-Sep-1950 Referring Provider (PT): Alusio   Encounter Date: 01/17/2020   PT End of Session - 01/17/20 1134    Visit Number 8    Date for PT Re-Evaluation 02/21/20    PT Start Time 1100    PT Stop Time 1145    PT Time Calculation (min) 45 min    Activity Tolerance Patient tolerated treatment well    Behavior During Therapy [facility] for tasks assessed/performed           Past Medical History:  Diagnosis Date  . Arthritis    Back knees  . Cervical spinal stenosis   . Complication of anesthesia   . Coronary artery disease    mild to moderate  . Dyslipidemia   . Dyspnea   . ED (erectile dysfunction)   . GERD (gastroesophageal reflux disease)   . Hypertension   . PONV (postoperative nausea and vomiting)    Happened once 30 yrs ago. No problems since.  . Prostate cancer [facility])     Past Surgical History:  Procedure Laterality Date  . ANTERIOR CERVICAL DECOMPRESSION/DISCECTOMY FUSION 4 LEVELS N/A 09/22/2017   Procedure: ACDF - C3-C4 - C4-C5 - C5-C6 - C6-C7;  Surgeon: Earnie Larsson, MD;  Location[place]  Service: Neurosurgery;  Laterality: N/A;  . APPENDECTOMY  1985  . CARDIAC CATHETERIZATION  2002, 2011   mild-mod CAD  . CARDIAC CATHETERIZATION N/A 03/12/2015   Procedure: Left Heart Cath and Coronary Angiography;  Surgeon: Pixie Casino, MD;  Location: [place] [facility];  Service: Cardiovascular;  Laterality: N/A;  . COLON RESECTION N/A 02/22/2019   Procedure: LAPAROSCOPIC COLON RESECTION AND DIVERTICULECTOMY;  Surgeon: Kieth Brightly, Arta Bruce, MD;  [facility];  Service: General;  Laterality: N/A;  . HAND SURGERY Left    had joints fused  . JOINT REPLACEMENT Right 04/11/2009  . LAPAROSCOPIC LYSIS OF ADHESIONS N/A  02/22/2019   Procedure: LAPAROSCOPIC LYSIS OF ADHESIONS;  Surgeon: Kieth Brightly Arta Bruce, MD;  [facility];  Service: General;  Laterality: N/A;  . PROSTATECTOMY  03/2007  . RIGHT/LEFT HEART CATH AND CORONARY ANGIOGRAPHY N/A 09/27/2019   Procedure: RIGHT/LEFT HEART CATH AND CORONARY ANGIOGRAPHY;  Surgeon: Jolaine Artist, MD;  Location: [place] [facility];  Service: Cardiovascular;  Laterality: N/A;  . TOTAL KNEE ARTHROPLASTY  04/2009  . TOTAL KNEE ARTHROPLASTY Left 12/19/2019   Procedure: TOTAL KNEE ARTHROPLASTY;  Surgeon: Gaynelle Arabian, MD;  [facility];  Service: Orthopedics;  Laterality: Left;  50 mins    There were no vitals filed for this visit.   Subjective Assessment - 01/17/20 1101    Subjective Quad is sore    Currently in Pain? Yes    Pain Score 2     Pain Location Knee    Pain Orientation Left                             [facility] Adult PT Treatment/Exercise - 01/17/20 0001      Knee/Hip Exercises: Aerobic   Elliptical I7 R7 x 3 min     Recumbent Bike L0 x 5 min      Knee/Hip Exercises: Machines for Strengthening   Cybex Knee Extension 20# 2x10 BLE; 10# 2x10 LLE  Cybex Knee Flexion 35lb 2x15, LLE 20lb 2x10    Total Gym Leg Press 50# BLE 2x10      Knee/Hip Exercises: Standing   Heel Raises Both;2 sets;2 seconds;15 reps    Forward Step Up Right;2 sets;10 reps;Hand Hold: 1;Step Height: 6"    Walking with Sports Cord 40lb 4 way x4 each       Vasopneumatic   Number Minutes Vasopneumatic  10 minutes    Vasopnuematic Location  Knee    Vasopneumatic Pressure Low    Vasopneumatic Temperature  33                    PT Short Term Goals - 12/27/19 1220      PT SHORT TERM GOAL #1   Title Pt will be independent with HEP    Status Achieved             PT Long Term Goals - 01/12/20 1143      PT LONG TERM GOAL #1   Title Pt will demo L knee flexion 110 deg    Baseline 119    Status Achieved      PT LONG TERM GOAL #2    Title Pt will demo L knee extension 0 deg    Baseline -8    Status Partially Met      PT LONG TERM GOAL #3   Title Pt will demo TUG time <15 sec with no AD      PT LONG TERM GOAL #4   Title Pt will demo decrease in circumferential edema to <2 cm difference b/n R and L    Status Partially Met                 Plan - 01/17/20 1135    Clinical Impression Statement Pt enters clinic reporting increase soreness since last Friday. He completed well of the interventions. Avoided SL activity on leg press due to pt complaints. Decrease TKE knee with LLE during mobility. Pt reports that he could not tolerate TM today's due to soreness.    Examination-Activity Limitations Lift;Locomotion Level;Sit;Squat;Stairs;Stand    Examination-Participation Restrictions Community Activity;Interpersonal Relationship    Stability/Clinical Decision Making Evolving/Moderate complexity    Rehab Potential Good    PT Frequency 2x / week    PT Duration 8 weeks    PT Treatment/Interventions ADLs/Self Care Home Management;Cryotherapy;Electrical Stimulation;Gait training;Stair training;Functional mobility training;Therapeutic activities;Therapeutic exercise;Neuromuscular re-education;Manual techniques;Patient/family education;Scar mobilization;Passive range of motion;Vasopneumatic Device    PT Next Visit Plan ROM and function           Patient will benefit from skilled therapeutic intervention in order to improve the following deficits and impairments:  Abnormal gait, Decreased range of motion, Difficulty walking, Pain, Hypomobility, Impaired flexibility, Decreased mobility, Decreased strength, Increased edema  Visit Diagnosis: Acute pain of left knee  Localized edema  Difficulty in walking, not elsewhere classified  Stiffness of left knee, not elsewhere classified     Problem List Patient Active Problem List   Diagnosis Date Noted  . OA (osteoarthritis) of knee 12/19/2019  . Primary osteoarthritis  of left knee 12/19/2019  . Small bowel obstruction ([place]) 02/23/2019  . Respiratory distress following surgery 09/24/2017  . Cervical spondylosis with radiculopathy 09/22/2017  . Viral URI with cough 04/01/2016  . Chest pain, unspecified   . HTN (hypertension) 01/13/2013  . Dyslipidemia 01/13/2013  . CAD (coronary artery disease) 01/13/2013    Scot Jun, PTA 01/17/2020, 11:37 AM  [place], [place], [address] Phone: [phone]   Fax:  [phone]  Name: MAICO MULVEHILL MRN: [id number] Date of Birth: [birth year]

## 2020-01-19 ENCOUNTER — Ambulatory Visit: Payer: Medicare Other | Admitting: Physical Therapy

## 2020-01-19 ENCOUNTER — Encounter: Payer: Self-pay | Admitting: Physical Therapy

## 2020-01-19 ENCOUNTER — Other Ambulatory Visit: Payer: Self-pay

## 2020-01-19 DIAGNOSIS — R262 Difficulty in walking, not elsewhere classified: Secondary | ICD-10-CM

## 2020-01-19 DIAGNOSIS — M25662 Stiffness of left knee, not elsewhere classified: Secondary | ICD-10-CM

## 2020-01-19 DIAGNOSIS — R600 Localized edema: Secondary | ICD-10-CM | POA: Diagnosis not present

## 2020-01-19 DIAGNOSIS — M25562 Pain in left knee: Secondary | ICD-10-CM

## 2020-01-19 NOTE — Therapy (Signed)
[place], [place], [address] Phone: [phone]   Fax:  [phone]  Physical Therapy Treatment  Patient Details  Name: Charles Oconnor MRN: [id number] Date of Birth: 23-Sep-1950 Referring Provider (PT): Alusio   Encounter Date: 01/19/2020   PT End of Session - 01/19/20 8616    Visit Number 9    Date for PT Re-Evaluation 02/21/20    PT Start Time 1058    PT Stop Time 1145    PT Time Calculation (min) 47 min    Activity Tolerance Patient tolerated treatment well    Behavior During Therapy [facility] for tasks assessed/performed           Past Medical History:  Diagnosis Date  . Arthritis    Back knees  . Cervical spinal stenosis   . Complication of anesthesia   . Coronary artery disease    mild to moderate  . Dyslipidemia   . Dyspnea   . ED (erectile dysfunction)   . GERD (gastroesophageal reflux disease)   . Hypertension   . PONV (postoperative nausea and vomiting)    Happened once 30 yrs ago. No problems since.  . Prostate cancer [facility])     Past Surgical History:  Procedure Laterality Date  . ANTERIOR CERVICAL DECOMPRESSION/DISCECTOMY FUSION 4 LEVELS N/A 09/22/2017   Procedure: ACDF - C3-C4 - C4-C5 - C5-C6 - C6-C7;  Surgeon: Earnie Larsson, MD;  Location: [place];  Service: Neurosurgery;  Laterality: N/A;  . APPENDECTOMY  1985  . CARDIAC CATHETERIZATION  2002, 2011   mild-mod CAD  . CARDIAC CATHETERIZATION N/A 03/12/2015   Procedure: Left Heart Cath and Coronary Angiography;  Surgeon: Pixie Casino, MD;  Location: [place] [facility];  Service: Cardiovascular;  Laterality: N/A;  . COLON RESECTION N/A 02/22/2019   Procedure: LAPAROSCOPIC COLON RESECTION AND DIVERTICULECTOMY;  Surgeon: Kieth Brightly, Arta Bruce, MD;  [facility];  Service: General;  Laterality: N/A;  . HAND SURGERY Left    had joints fused  . JOINT REPLACEMENT Right 04/11/2009  . LAPAROSCOPIC LYSIS OF ADHESIONS N/A  02/22/2019   Procedure: LAPAROSCOPIC LYSIS OF ADHESIONS;  Surgeon: Kieth Brightly Arta Bruce, MD;  [facility];  Service: General;  Laterality: N/A;  . PROSTATECTOMY  03/2007  . RIGHT/LEFT HEART CATH AND CORONARY ANGIOGRAPHY N/A 09/27/2019   Procedure: RIGHT/LEFT HEART CATH AND CORONARY ANGIOGRAPHY;  Surgeon: Jolaine Artist, MD;  Location: [place] [facility];  Service: Cardiovascular;  Laterality: N/A;  . TOTAL KNEE ARTHROPLASTY  04/2009  . TOTAL KNEE ARTHROPLASTY Left 12/19/2019   Procedure: TOTAL KNEE ARTHROPLASTY;  Surgeon: Gaynelle Arabian, MD;  [facility];  Service: Orthopedics;  Laterality: Left;  50 mins    There were no vitals filed for this visit.   Subjective Assessment - 01/19/20 1100    Subjective Pt reports a little sore    Currently in Pain? Yes    Pain Score 5     Pain Location Knee    Pain Orientation Left              [facility] PT Assessment - 01/19/20 0001      AROM   Left Knee Extension 6    Left Knee Flexion 123      PROM   Left Knee Extension 5    Left Knee Flexion 125  [facility] Adult PT Treatment/Exercise - 01/19/20 0001      Knee/Hip Exercises: Stretches   Gastroc Stretch Both;1 rep;30 seconds      Knee/Hip Exercises: Aerobic   Elliptical R7 2 fwd/2bkwd    Recumbent Bike L0 x 7 min      Knee/Hip Exercises: Machines for Strengthening   Cybex Knee Extension 20# 2x10 BLE; 10# 2x10 LLE    Cybex Knee Flexion 35lb 2x15, LLE 20lb 2x10    Total Gym Leg Press 50# BLE 3x10      Knee/Hip Exercises: Standing   Heel Raises Both;2 sets;2 seconds;15 reps      Vasopneumatic   Number Minutes Vasopneumatic  15 minutes    Vasopnuematic Location  Knee    Vasopneumatic Pressure Low    Vasopneumatic Temperature  33                    PT Short Term Goals - 12/27/19 1220      PT SHORT TERM GOAL #1   Title Pt will be independent with HEP    Status Achieved             PT Long Term Goals - 01/12/20  1143      PT LONG TERM GOAL #1   Title Pt will demo L knee flexion 110 deg    Baseline 119    Status Achieved      PT LONG TERM GOAL #2   Title Pt will demo L knee extension 0 deg    Baseline -8    Status Partially Met      PT LONG TERM GOAL #3   Title Pt will demo TUG time <15 sec with no AD      PT LONG TERM GOAL #4   Title Pt will demo decrease in circumferential edema to <2 cm difference b/n R and L    Status Partially Met                 Plan - 01/19/20 1131    Clinical Impression Statement Pt reporting increased soreness this rx but states it is getting better. Still avoided SL on leg press d/t soreness. Did well with other machine interventions. Pt demos knee flexion to 125 and knee extension to 5 deg this rx.    PT Treatment/Interventions ADLs/Self Care Home Management;Cryotherapy;Electrical Stimulation;Gait training;Stair training;Functional mobility training;Therapeutic activities;Therapeutic exercise;Neuromuscular re-education;Manual techniques;Patient/family education;Scar mobilization;Passive range of motion;Vasopneumatic Device    PT Next Visit Plan ROM and function    Consulted and Agree with Plan of Care Patient           Patient will benefit from skilled therapeutic intervention in order to improve the following deficits and impairments:  Abnormal gait, Decreased range of motion, Difficulty walking, Pain, Hypomobility, Impaired flexibility, Decreased mobility, Decreased strength, Increased edema  Visit Diagnosis: Acute pain of left knee  Localized edema  Difficulty in walking, not elsewhere classified  Stiffness of left knee, not elsewhere classified     Problem List Patient Active Problem List   Diagnosis Date Noted  . OA (osteoarthritis) of knee 12/19/2019  . Primary osteoarthritis of left knee 12/19/2019  . Small bowel obstruction ([facility]) 02/23/2019  . Respiratory distress following surgery 09/24/2017  . Cervical spondylosis with  radiculopathy 09/22/2017  . Viral URI with cough 04/01/2016  . Chest pain, unspecified   . HTN (hypertension) 01/13/2013  . Dyslipidemia 01/13/2013  . CAD (coronary artery disease) 01/13/2013   Amanda Sugg, PT, DPT Amanda M Sugg 01/19/2020,   11:33 AM  [place], [place], [address] Phone: [phone]   Fax:  [phone]  Name: ALP GOLDWATER MRN: [id number] Date of Birth: Mar 19, 1951

## 2020-01-23 DIAGNOSIS — Z96652 Presence of left artificial knee joint: Secondary | ICD-10-CM | POA: Diagnosis not present

## 2020-01-23 DIAGNOSIS — Z471 Aftercare following joint replacement surgery: Secondary | ICD-10-CM | POA: Diagnosis not present

## 2020-01-24 ENCOUNTER — Ambulatory Visit: Payer: Medicare Other | Admitting: Physical Therapy

## 2020-01-26 ENCOUNTER — Other Ambulatory Visit: Payer: Self-pay

## 2020-01-26 ENCOUNTER — Encounter: Payer: Medicare Other | Admitting: Physical Therapy

## 2020-01-26 ENCOUNTER — Encounter: Payer: Self-pay | Admitting: Internal Medicine

## 2020-01-26 ENCOUNTER — Ambulatory Visit (INDEPENDENT_AMBULATORY_CARE_PROVIDER_SITE_OTHER): Payer: Medicare Other | Admitting: Internal Medicine

## 2020-01-26 VITALS — BP 107/64 | HR 52 | Ht 73.0 in | Wt 197.6 lb

## 2020-01-26 DIAGNOSIS — I2510 Atherosclerotic heart disease of native coronary artery without angina pectoris: Secondary | ICD-10-CM

## 2020-01-26 DIAGNOSIS — E785 Hyperlipidemia, unspecified: Secondary | ICD-10-CM | POA: Diagnosis not present

## 2020-01-26 DIAGNOSIS — R0600 Dyspnea, unspecified: Secondary | ICD-10-CM | POA: Diagnosis not present

## 2020-01-26 DIAGNOSIS — I10 Essential (primary) hypertension: Secondary | ICD-10-CM

## 2020-01-26 DIAGNOSIS — I2583 Coronary atherosclerosis due to lipid rich plaque: Secondary | ICD-10-CM

## 2020-01-26 NOTE — Patient Instructions (Signed)

## 2020-01-26 NOTE — Progress Notes (Signed)
OFFICE NOTE  Chief Complaint:  Follow-up chest pressure, dyspnea  Primary Care Physician: Mayra Neer, MD  HPI:  Charles Oconnor  is a 69 year old gentleman with history of hypertension, dyslipidemia and mild to moderate coronary disease. We have been following him for dyslipidemia and he has had liver enzyme abnormalities before on Zocor and was changed to Pravachol with pretty good control. Sometimes he gets chest discomfort; however, it is pretty short lived and possibly related to that. His EKG today shows a sinus bradycardia at 54 and I understand recently he was placed on a beta blocker which caused marked sinus bradycardia and that was promptly discontinued. At this point, although there is possibly an occasion for that given his history of coronary disease, his heart rate being low, he is unable to tolerate a beta blocker. He is on an ACE inhibitor which is pretty good at controlling his blood pressure as well as amlodipine.   Charles Oconnor returns today for followup. He reports doing fairly well. He's recently started doing some walking and has actually lost about 10 pounds in the last 6 months. He says he does feel better and have more energy. A portion is developed some peripheral neuropathy. He's been started on Neurontin by a neurologist in [place]. He seems to be tolerating this medicine although has some occasional lightheadedness and dizziness. He is peripheral nerve pain is better however does have numbness in his feet. He occasionally gets some mild swelling. He also was told that he had high vitamin B6 levels and was told to stop taking his multivitamin.  I the pleasure see Charles Oconnor back in the office today. Overall he is doing fairly well except he is struggling with low back pain and some neuropathic pain in his legs. He's been getting back injections without much improvement. He's also been describing some occasional chest discomfort and progressive fatigue over  the past several months. He feels like he has no energy. There is also complaints of mid back pain between his shoulder blades which occurs on unusual occasions and not associated with his low back pain. Sometimes it's a little worse when exerting himself although he's not been as active due to his back problems. As a reminder he had heart catheterization in 2011 with Dr. Tina Griffiths, which demonstrated mild to moderate coronary artery disease.  He recently had lab work through his primary care provider which looks fairly normal except for an elevated cholesterol profile. His total cholesterol is 166, HL 43, triglycerides 112 and LDL 101. He's currently on pravastatin 80 mg daily and says that he has had problems with myalgias and liver function abnormalities on simvastatin in the past.  Charles Oconnor returns today for follow-up. He recently underwent a stress test which showed the following:   The left ventricular ejection fraction is mildly decreased (45-54%).  Nuclear stress EF: 52%.  No T wave inversion was noted during stress.  There was no ST segment deviation noted during stress.  Defect 1: There is a medium defect of moderate severity.  There is a moderate-sized and intensity, partially reversible inferoseptal perfusion defect which could represent ischemia. LVEF 52% with basal inferoseptal dyskinesis. This is an intermediate risk study.   Based on these findings, I recommended either continued medical therapy or we could consider cardiac catheterization. Subsequently he felt like his chest pain was worsening and ultimately based on his prior catheterization results, we decided to proceed with cardiac catheterization. The results are as follows:  Prox RCA lesion, 40% stenosed. The lesion was not previously treated.  Prox Cx to Dist Cx lesion, 20% stenosed. The lesion was not previously treated.  Ost 1st Diag to 1st Diag lesion, 40% stenosed. The lesion was not previously  treated.  Dist LAD lesion, 20% stenosed. The lesion was not previously treated.  The left ventricular systolic function is normal.  Mild, non-obstructive CAD - somewhat improved from prior cath in 2011. Normal LV function. False positive nuclear stress test. At this point, Charles Oconnor is doing much better and denies any chest pain or worsening shortness of breath. He gets a small amount of ankle edema which I attribute to amlodipine.  04/01/2016  Charles Oconnor returns today for follow-up. He denies any chest pain or worsening shortness of breath. He apparently had a sore throat yesterday and has some drainage today. He is interested in some over-the-counter medications for that. He's had no chest pain that sounds anginal. Blood pressure well controlled. Recent cholesterol check through his primary care provider shows good control with LDL less than 100.  03/13/2017  Charles Oconnor returns today for follow-up.  Overall he is doing exceedingly well.  Blood pressure is well-controlled today 126/70.  His body mass index is near normal.  Cholesterol is due to be assessed but has been well controlled.  EKG shows sinus bradycardia at 52 without ischemic changes.  He denies any chest pain or worsening shortness of breath.  03/19/2018  Charles Oconnor is seen today in follow-up.  He recently underwent cervical surgery for pain.  He had a fusion of the lower cervical vertebrae by Dr. Trenton Gammon and is doing much better.  He did have postoperative swelling and had steroids because of difficulty breathing.  Lipids a year ago were well controlled.  He has an upcoming appoint with Dr. Manuella Ghazi who will check his cholesterol in about 2 months.  I asked him to send [place] those records.  He denies any chest pain or shortness of breath.  Blood pressure is well controlled.  He did recently retire.  03/23/2019  Charles Oconnor is seen today for follow-up.  He reports some progressively worsening shortness of breath with exertion.  He said  this has been going on for several months however recently had an intestinal obstruction.  He said he was up in [place] and started having symptoms and then came here and went to [place] long.  A CT scan indicated small bowel obstruction.  He has a history of prior surgeries including a robotic prostatectomy and appendectomy and therefore had surgical adhesions.  He is improving from that but does report that he has had some worsening symptoms.  He is concerned about coronary disease.  I performed his last heart catheterization in 2016 which showed some mild to moderate nonobstructive coronary disease.  His arteries actually looked somewhat better than it had previously.  His most recent lipid profile showed total cholesterol 153, triglycerides 179, HDL 39 and LDL 78.  05/10/2019  Charles Oconnor returns today for follow-up of his CT coronary angiogram.  He reports persistent shortness of breath with exertion.  His CT coronary angiogram was markedly abnormal demonstrating a high coronary calcium score 1119 which was 88th percentile for age and sex matched control.  There is heavy calcification of the right coronary and possible severe stenosis.  The mid LAD was also calcified and it was felt he had at least CAD RADS 3 disease.  CT FFR was sent and however did not demonstrate any  significant stenosis.  Although it is possible for a false negative FFR, it is fairly unlikely.  That being said he has extensive coronary disease and will need additional aggressive risk factor modification.  He is currently on pravastatin 80 mg and LDL remains above target at 78.   08/10/2019  Charles Oconnor is seen today in follow-up.  He still endorses shortness of breath.  He also has some anxiety.  This seems to be worsening he thinks.  He had recent pulmonary function testing which does show some mild reduction in FEV1 and FVC with some improvement after bronchodilator.  He has follow-up with Dr. Vaughan Browner in pulmonary later this  month.  I wonder however if his progressive dyspnea is his multivessel disease.  It could very well be is ischemic equivalent.  After switching him to rosuvastatin, he has had marked improvement in his lipids.  Total cholesterol now 125, triglycerides 137, HDL 40 and LDL 61.  Unfortunately, he reports intolerance of the statin causing what he feels is myalgias and some joint pain.  09/22/2019  Charles Oconnor returns today for follow-up.  He reports possibly some improvement in his dyspnea with Imdur.  He underwent a CT scan high resolution by Dr. Vaughan Browner, this demonstrated no evidence of any interstitial lung disease.  It is thought probably from a pulmonary standpoint that it is not explaining his shortness of breath.  As mentioned above he underwent cardiac catheterization by myself in 2016 which showed mild to moderate nonobstructive coronary disease.  That being said he had a lot of coronary calcium recently by CT coronary angiography, but FFR was negative.  I am concerned that this may be a false negative and that his symptoms represent more significant coronary disease.  01/26/2020  Charles Oconnor is seen today in follow-up.  He will underwent cardiopulmonary exercise testing this July for exercise-induced dyspnea.  Despite this he did an excellent exercise effort with normal functional capacity and no indication of cardiopulmonary limitation.  There was some restrictive lung physiology and he is noted to have an elevated right hemidiaphragm.  Other than that it was thought that maybe there was some contribution of body habitus and exercise intolerance and may be some hyperventilation at peak exercise.  Despite the fact that he had significant coronary calcification there was no significant obstruction and therefore we will continue to manage this medically but cannot find a clear reason as to why he has the shortness of breath other than possibly small vessel disease.  PMHx:  Past Medical History:   Diagnosis Date  . Arthritis    Back knees  . Cervical spinal stenosis   . Complication of anesthesia   . Coronary artery disease    mild to moderate  . Dyslipidemia   . Dyspnea   . ED (erectile dysfunction)   . GERD (gastroesophageal reflux disease)   . Hypertension   . PONV (postoperative nausea and vomiting)    Happened once 30 yrs ago. No problems since.  . Prostate cancer [facility])     Past Surgical History:  Procedure Laterality Date  . ANTERIOR CERVICAL DECOMPRESSION/DISCECTOMY FUSION 4 LEVELS N/A 09/22/2017   Procedure: ACDF - C3-C4 - C4-C5 - C5-C6 - C6-C7;  Surgeon: Earnie Larsson, MD;  Location: [facility];  Service: Neurosurgery;  Laterality: N/A;  . APPENDECTOMY  1985  . CARDIAC CATHETERIZATION  2002, 2011   mild-mod CAD  . CARDIAC CATHETERIZATION N/A 03/12/2015   Procedure: Left Heart Cath and Coronary Angiography;  Surgeon: Nadean Corwin  Starling Jessie, MD;  Location: [place] [facility];  Service: Cardiovascular;  Laterality: N/A;  . COLON RESECTION N/A 02/22/2019   Procedure: LAPAROSCOPIC COLON RESECTION AND DIVERTICULECTOMY;  Surgeon: Kieth Brightly, Arta Bruce, MD;  Location: [facility];  Service: General;  Laterality: N/A;  . HAND SURGERY Left    had joints fused  . JOINT REPLACEMENT Right 04/11/2009  . LAPAROSCOPIC LYSIS OF ADHESIONS N/A 02/22/2019   Procedure: LAPAROSCOPIC LYSIS OF ADHESIONS;  Surgeon: Kieth Brightly Arta Bruce, MD;  Location: [facility];  Service: General;  Laterality: N/A;  . PROSTATECTOMY  03/2007  . RIGHT/LEFT HEART CATH AND CORONARY ANGIOGRAPHY N/A 09/27/2019   Procedure: RIGHT/LEFT HEART CATH AND CORONARY ANGIOGRAPHY;  Surgeon: Jolaine Artist, MD;  Location: [place] [facility];  Service: Cardiovascular;  Laterality: N/A;  . TOTAL KNEE ARTHROPLASTY  04/2009  . TOTAL KNEE ARTHROPLASTY Left 12/19/2019   Procedure: TOTAL KNEE ARTHROPLASTY;  Surgeon: Gaynelle Arabian, MD;  Location: [facility];  Service: Orthopedics;  Laterality: Left;  50 mins    FAMHx:  Family History  Problem  Relation Age of Onset  . Heart attack Father   . Diabetes Mother   . Heart failure Mother   . Coronary artery disease Mother   . Stroke Mother   . Prostate cancer Brother   . Heart attack Brother        also CVA  . Heart attack Brother        also CVA  . Cancer Brother     SOCHx:   reports that he has never smoked. He has never used smokeless tobacco. He reports that he does not drink alcohol and does not use drugs.  ALLERGIES:  Allergies  Allergen Reactions  . Zocor [Simvastatin] Nausea Only and Other (See Comments)    Elevate liver enzymes    ROS: Pertinent items noted in HPI and remainder of comprehensive ROS otherwise negative.  HOME MEDS: Current Outpatient Medications  Medication Sig Dispense Refill  . amLODipine (NORVASC) 10 MG tablet Take 10 mg by mouth daily.    . isosorbide mononitrate (IMDUR) 30 MG 24 hr tablet Take 1 tablet (30 mg total) by mouth daily. 90 tablet 3  . lisinopril-hydrochlorothiazide (ZESTORETIC) 20-12.5 MG tablet Take 2 tablets by mouth daily. 180 tablet 0  . pantoprazole (PROTONIX) 40 MG tablet Take 40 mg by mouth daily.     . rosuvastatin (CRESTOR) 40 MG tablet Take 1 tablet (40 mg total) by mouth daily. 90 tablet 3   No current facility-administered medications for this visit.    LABS/IMAGING: No results found for this or any previous visit (from the past 48 hour(s)). No results found.  VITALS: BP 107/64   Pulse (!) 52   Ht 6\' 1"  (1.854 m)   Wt 197 lb 9.6 oz (89.6 kg)   SpO2 99%   BMI 26.07 kg/m   EXAM: General appearance: alert and no distress Neck: no carotid bruit, no JVD and thyroid not enlarged, symmetric, no tenderness/mass/nodules Lungs: clear to auscultation bilaterally Heart: regular rate and rhythm, S1, S2 normal, no murmur, click, rub or gallop Abdomen: soft, non-tender; bowel sounds normal; no masses,  no organomegaly Extremities: extremities normal, atraumatic, no cyanosis or edema Pulses: 2+ and symmetric Skin:  Skin color, texture, turgor normal. No rashes or lesions Neurologic: Grossly normal Psych: Pleasant  EKG: Sinus bradycardia 52-personally reviewed  ASSESSMENT: 1. Progressive dyspnea on exertion/chest pressure -high CAC score of 1119, moderate to severe nonobstructive coronary disease and negative by FFR (04/2019) 2. Mild nonobstructive  coronary disease by cath (2016), somewhat improved compared to his prior study in 2011 -left and right heart catheterization in May 2021 showed mild nonobstructive CAD unchanged from 2016, normal LVEF and possible diastolic dysfunction.  Cardiopulmonary exercise testing demonstrated essentially normal findings. 3. Mild to moderate abnormalities in pulmonary function 4. Hypertension-controlled 5. Dyslipidemia - controlled 6. Acceptable risk for planned total knee replacement  PLAN: 1.   Charles Oconnor reports that he has had some recent development of some shortness of breath again with exertion which may be diastolic dysfunction or diastolic heart failure.  This worsened a little bit with some more swelling after being taken off of his thiazide diuretic.  He has restarted this.  Blood pressure is now low.  Overall he seems to be not limited at this point.  No further medication changes.  Follow-up with me annually or sooner as necessary.  Pixie Casino, MD, [facility], [place] Director of the Advanced Lipid Disorders &  Cardiovascular Risk Reduction Clinic Diplomate of the American Board of Clinical Lipidology Attending Cardiologist  Direct Dial: [phone]  Fax: [phone]  Website:  [email or web address] 01/26/2020, 11:08 AM

## 2020-01-30 ENCOUNTER — Encounter: Payer: Medicare Other | Admitting: Physical Therapy

## 2020-02-02 ENCOUNTER — Encounter: Payer: Medicare Other | Admitting: Physical Therapy

## 2020-02-20 DIAGNOSIS — Z23 Encounter for immunization: Secondary | ICD-10-CM | POA: Diagnosis not present

## 2020-03-14 DIAGNOSIS — M65331 Trigger finger, right middle finger: Secondary | ICD-10-CM | POA: Diagnosis not present

## 2020-03-14 DIAGNOSIS — M79641 Pain in right hand: Secondary | ICD-10-CM | POA: Diagnosis not present

## 2020-05-31 ENCOUNTER — Other Ambulatory Visit: Payer: Self-pay | Admitting: Internal Medicine

## 2020-06-20 DIAGNOSIS — Z0000 Encounter for general adult medical examination without abnormal findings: Secondary | ICD-10-CM | POA: Diagnosis not present

## 2020-06-20 DIAGNOSIS — Z8546 Personal history of malignant neoplasm of prostate: Secondary | ICD-10-CM | POA: Diagnosis not present

## 2020-06-20 DIAGNOSIS — J986 Disorders of diaphragm: Secondary | ICD-10-CM | POA: Diagnosis not present

## 2020-06-20 DIAGNOSIS — F411 Generalized anxiety disorder: Secondary | ICD-10-CM | POA: Diagnosis not present

## 2020-06-20 DIAGNOSIS — M549 Dorsalgia, unspecified: Secondary | ICD-10-CM | POA: Diagnosis not present

## 2020-06-20 DIAGNOSIS — E782 Mixed hyperlipidemia: Secondary | ICD-10-CM | POA: Diagnosis not present

## 2020-06-20 DIAGNOSIS — G4700 Insomnia, unspecified: Secondary | ICD-10-CM | POA: Diagnosis not present

## 2020-06-20 DIAGNOSIS — I2510 Atherosclerotic heart disease of native coronary artery without angina pectoris: Secondary | ICD-10-CM | POA: Diagnosis not present

## 2020-06-20 DIAGNOSIS — N529 Male erectile dysfunction, unspecified: Secondary | ICD-10-CM | POA: Diagnosis not present

## 2020-06-20 DIAGNOSIS — I119 Hypertensive heart disease without heart failure: Secondary | ICD-10-CM | POA: Diagnosis not present

## 2020-07-04 DIAGNOSIS — R7301 Impaired fasting glucose: Secondary | ICD-10-CM | POA: Diagnosis not present

## 2020-08-01 DIAGNOSIS — C61 Malignant neoplasm of prostate: Secondary | ICD-10-CM | POA: Diagnosis not present

## 2020-08-06 ENCOUNTER — Other Ambulatory Visit: Payer: Self-pay | Admitting: Internal Medicine

## 2020-08-07 DIAGNOSIS — M65341 Trigger finger, right ring finger: Secondary | ICD-10-CM | POA: Diagnosis not present

## 2020-08-07 DIAGNOSIS — M65331 Trigger finger, right middle finger: Secondary | ICD-10-CM | POA: Diagnosis not present

## 2020-08-08 DIAGNOSIS — N393 Stress incontinence (female) (male): Secondary | ICD-10-CM | POA: Diagnosis not present

## 2020-08-08 DIAGNOSIS — R351 Nocturia: Secondary | ICD-10-CM | POA: Diagnosis not present

## 2020-08-08 DIAGNOSIS — C61 Malignant neoplasm of prostate: Secondary | ICD-10-CM | POA: Diagnosis not present

## 2020-08-22 DIAGNOSIS — M6281 Muscle weakness (generalized): Secondary | ICD-10-CM | POA: Diagnosis not present

## 2020-08-22 DIAGNOSIS — M62838 Other muscle spasm: Secondary | ICD-10-CM | POA: Diagnosis not present

## 2020-08-22 DIAGNOSIS — N3946 Mixed incontinence: Secondary | ICD-10-CM | POA: Diagnosis not present

## 2020-08-22 DIAGNOSIS — M6289 Other specified disorders of muscle: Secondary | ICD-10-CM | POA: Diagnosis not present

## 2020-08-30 DIAGNOSIS — Z23 Encounter for immunization: Secondary | ICD-10-CM | POA: Diagnosis not present

## 2020-09-04 DIAGNOSIS — M65341 Trigger finger, right ring finger: Secondary | ICD-10-CM | POA: Diagnosis not present

## 2020-09-04 DIAGNOSIS — M65331 Trigger finger, right middle finger: Secondary | ICD-10-CM | POA: Diagnosis not present

## 2020-09-04 DIAGNOSIS — M65332 Trigger finger, left middle finger: Secondary | ICD-10-CM | POA: Diagnosis not present

## 2020-09-05 DIAGNOSIS — D2262 Melanocytic nevi of left upper limb, including shoulder: Secondary | ICD-10-CM | POA: Diagnosis not present

## 2020-09-05 DIAGNOSIS — L821 Other seborrheic keratosis: Secondary | ICD-10-CM | POA: Diagnosis not present

## 2020-09-05 DIAGNOSIS — D692 Other nonthrombocytopenic purpura: Secondary | ICD-10-CM | POA: Diagnosis not present

## 2020-09-05 DIAGNOSIS — D225 Melanocytic nevi of trunk: Secondary | ICD-10-CM | POA: Diagnosis not present

## 2020-09-05 DIAGNOSIS — L814 Other melanin hyperpigmentation: Secondary | ICD-10-CM | POA: Diagnosis not present

## 2020-09-05 DIAGNOSIS — D1801 Hemangioma of skin and subcutaneous tissue: Secondary | ICD-10-CM | POA: Diagnosis not present

## 2020-09-05 DIAGNOSIS — D2271 Melanocytic nevi of right lower limb, including hip: Secondary | ICD-10-CM | POA: Diagnosis not present

## 2020-09-05 DIAGNOSIS — L570 Actinic keratosis: Secondary | ICD-10-CM | POA: Diagnosis not present

## 2020-09-05 DIAGNOSIS — Z85828 Personal history of other malignant neoplasm of skin: Secondary | ICD-10-CM | POA: Diagnosis not present

## 2020-09-25 DIAGNOSIS — M65341 Trigger finger, right ring finger: Secondary | ICD-10-CM | POA: Diagnosis not present

## 2020-09-25 DIAGNOSIS — M65331 Trigger finger, right middle finger: Secondary | ICD-10-CM | POA: Diagnosis not present

## 2020-11-02 DIAGNOSIS — M65332 Trigger finger, left middle finger: Secondary | ICD-10-CM | POA: Diagnosis not present

## 2020-11-02 DIAGNOSIS — M65331 Trigger finger, right middle finger: Secondary | ICD-10-CM | POA: Diagnosis not present

## 2020-11-02 DIAGNOSIS — Z4789 Encounter for other orthopedic aftercare: Secondary | ICD-10-CM | POA: Diagnosis not present

## 2020-11-02 DIAGNOSIS — M65341 Trigger finger, right ring finger: Secondary | ICD-10-CM | POA: Diagnosis not present

## 2020-12-13 DIAGNOSIS — Z96651 Presence of right artificial knee joint: Secondary | ICD-10-CM | POA: Diagnosis not present

## 2020-12-13 DIAGNOSIS — Z96652 Presence of left artificial knee joint: Secondary | ICD-10-CM | POA: Diagnosis not present

## 2020-12-13 DIAGNOSIS — Z96653 Presence of artificial knee joint, bilateral: Secondary | ICD-10-CM | POA: Diagnosis not present

## 2020-12-18 DIAGNOSIS — I119 Hypertensive heart disease without heart failure: Secondary | ICD-10-CM | POA: Diagnosis not present

## 2020-12-18 DIAGNOSIS — E782 Mixed hyperlipidemia: Secondary | ICD-10-CM | POA: Diagnosis not present

## 2020-12-28 DIAGNOSIS — M65331 Trigger finger, right middle finger: Secondary | ICD-10-CM | POA: Diagnosis not present

## 2020-12-28 DIAGNOSIS — M65341 Trigger finger, right ring finger: Secondary | ICD-10-CM | POA: Diagnosis not present

## 2021-01-11 DIAGNOSIS — U071 COVID-19: Secondary | ICD-10-CM | POA: Diagnosis not present

## 2021-01-23 DIAGNOSIS — Z23 Encounter for immunization: Secondary | ICD-10-CM | POA: Diagnosis not present

## 2021-01-26 ENCOUNTER — Other Ambulatory Visit: Payer: Self-pay | Admitting: Internal Medicine

## 2021-03-01 DIAGNOSIS — U071 COVID-19: Secondary | ICD-10-CM | POA: Diagnosis not present

## 2021-03-14 DIAGNOSIS — M65331 Trigger finger, right middle finger: Secondary | ICD-10-CM | POA: Diagnosis not present

## 2021-03-14 DIAGNOSIS — M65332 Trigger finger, left middle finger: Secondary | ICD-10-CM | POA: Diagnosis not present

## 2021-03-14 DIAGNOSIS — M65341 Trigger finger, right ring finger: Secondary | ICD-10-CM | POA: Diagnosis not present

## 2021-03-27 ENCOUNTER — Encounter: Payer: Self-pay | Admitting: Internal Medicine

## 2021-03-27 ENCOUNTER — Ambulatory Visit (INDEPENDENT_AMBULATORY_CARE_PROVIDER_SITE_OTHER): Payer: Medicare Other | Admitting: Internal Medicine

## 2021-03-27 ENCOUNTER — Other Ambulatory Visit: Payer: Self-pay

## 2021-03-27 VITALS — BP 107/65 | HR 59 | Ht 73.0 in | Wt 195.2 lb

## 2021-03-27 DIAGNOSIS — I10 Essential (primary) hypertension: Secondary | ICD-10-CM

## 2021-03-27 DIAGNOSIS — E785 Hyperlipidemia, unspecified: Secondary | ICD-10-CM | POA: Diagnosis not present

## 2021-03-27 DIAGNOSIS — I2510 Atherosclerotic heart disease of native coronary artery without angina pectoris: Secondary | ICD-10-CM

## 2021-03-27 DIAGNOSIS — I2583 Coronary atherosclerosis due to lipid rich plaque: Secondary | ICD-10-CM | POA: Diagnosis not present

## 2021-03-27 DIAGNOSIS — R7303 Prediabetes: Secondary | ICD-10-CM | POA: Diagnosis not present

## 2021-03-27 LAB — HEMOGLOBIN A1C
Est. average glucose Bld gHb Est-mCnc: 128 mg/dL
Hgb A1c MFr Bld: 6.1 % — ABNORMAL HIGH (ref 4.8–5.6)

## 2021-03-27 LAB — LIPID PANEL
Chol/HDL Ratio: 3 ratio (ref 0.0–5.0)
Cholesterol, Total: 145 mg/dL (ref 100–199)
HDL: 48 mg/dL (ref >39)
LDL Chol Calc (NIH): 77 mg/dL (ref 0–99)
Triglycerides: 112 mg/dL (ref 0–149)
VLDL Cholesterol Cal: 20 mg/dL (ref 5–40)

## 2021-03-27 NOTE — Patient Instructions (Signed)
Medication Instructions:  Continue same medications *If you need a refill on your cardiac medications before your next appointment, please call your pharmacy*   Lab Work: Lipid panel,A1C  today If you have labs (blood work) drawn today and your tests are completely normal, you will receive your results only by: [place] (if you have MyChart) OR A paper copy in the mail If you have any lab test that is abnormal or we need to change your treatment, we will call you to review the results.   Testing/Procedures: None ordered   Follow-Up: At [facility], you and your health needs are our priority.  As part of our continuing mission to provide you with exceptional heart care, we have created designated Provider Care Teams.  These Care Teams include your primary Cardiologist (physician) and Advanced Practice Providers (APPs -  Physician Assistants and Nurse Practitioners) who all work together to provide you with the care you need, when you need it.  We recommend signing up for the patient portal called "MyChart".  Sign up information is provided on this After Visit Summary.  MyChart is used to connect with patients for Virtual Visits (Telemedicine).  Patients are able to view lab/test results, encounter notes, upcoming appointments, etc.  Non-urgent messages can be sent to your provider as well.   To learn more about what you can do with MyChart, go to [email or web address].      Your next appointment:  1 year       The format for your next appointment: Office   Provider: Dr.Hilty

## 2021-03-27 NOTE — Progress Notes (Signed)
OFFICE NOTE  Chief Complaint:  Follow-up  Primary Care Physician: Mayra Neer, MD  HPI:  Charles Oconnor  is a 70 year old gentleman with history of hypertension, dyslipidemia and mild to moderate coronary disease. We have been following him for dyslipidemia and he has had liver enzyme abnormalities before on Zocor and was changed to Pravachol with pretty good control. Sometimes he gets chest discomfort; however, it is pretty short lived and possibly related to that. His EKG today shows a sinus bradycardia at 54 and I understand recently he was placed on a beta blocker which caused marked sinus bradycardia and that was promptly discontinued. At this point, although there is possibly an occasion for that given his history of coronary disease, his heart rate being low, he is unable to tolerate a beta blocker. He is on an ACE inhibitor which is pretty good at controlling his blood pressure as well as amlodipine.   Charles Oconnor returns today for followup. He reports doing fairly well. He's recently started doing some walking and has actually lost about 10 pounds in the last 6 months. He says he does feel better and have more energy. A portion is developed some peripheral neuropathy. He's been started on Neurontin by a neurologist in [place]. He seems to be tolerating this medicine although has some occasional lightheadedness and dizziness. He is peripheral nerve pain is better however does have numbness in his feet. He occasionally gets some mild swelling. He also was told that he had high vitamin B6 levels and was told to stop taking his multivitamin.  I the pleasure see Charles Oconnor back in the office today. Overall he is doing fairly well except he is struggling with low back pain and some neuropathic pain in his legs. He's been getting back injections without much improvement. He's also been describing some occasional chest discomfort and progressive fatigue over the past several months.  He feels like he has no energy. There is also complaints of mid back pain between his shoulder blades which occurs on unusual occasions and not associated with his low back pain. Sometimes it's a little worse when exerting himself although he's not been as active due to his back problems. As a reminder he had heart catheterization in 2011 with Dr. Tina Griffiths, which demonstrated mild to moderate coronary artery disease.  He recently had lab work through his primary care provider which looks fairly normal except for an elevated cholesterol profile. His total cholesterol is 166, HL 43, triglycerides 112 and LDL 101. He's currently on pravastatin 80 mg daily and says that he has had problems with myalgias and liver function abnormalities on simvastatin in the past.  Mr. Caso returns today for follow-up. He recently underwent a stress test which showed the following:  The left ventricular ejection fraction is mildly decreased (45-54%). Nuclear stress EF: 52%. No T wave inversion was noted during stress. There was no ST segment deviation noted during stress. Defect 1: There is a medium defect of moderate severity.   There is a moderate-sized and intensity, partially reversible inferoseptal perfusion defect which could represent ischemia. LVEF 52% with basal inferoseptal dyskinesis. This is an intermediate risk study.     Based on these findings, I recommended either continued medical therapy or we could consider cardiac catheterization. Subsequently he felt like his chest pain was worsening and ultimately based on his prior catheterization results, we decided to proceed with cardiac catheterization. The results are as follows:  Prox RCA lesion, 40% stenosed. The  lesion was not previously treated. Prox Cx to Dist Cx lesion, 20% stenosed. The lesion was not previously treated. Ost 1st Diag to 1st Diag lesion, 40% stenosed. The lesion was not previously treated. Dist LAD lesion, 20% stenosed. The lesion was  not previously treated. The left ventricular systolic function is normal.   Mild, non-obstructive CAD - somewhat improved from prior cath in 2011. Normal LV function. False positive nuclear stress test. At this point, Charles Oconnor is doing much better and denies any chest pain or worsening shortness of breath. He gets a small amount of ankle edema which I attribute to amlodipine.  04/01/2016  Charles Oconnor returns today for follow-up. He denies any chest pain or worsening shortness of breath. He apparently had a sore throat yesterday and has some drainage today. He is interested in some over-the-counter medications for that. He's had no chest pain that sounds anginal. Blood pressure well controlled. Recent cholesterol check through his primary care provider shows good control with LDL less than 100.  03/13/2017  Charles Oconnor returns today for follow-up.  Overall he is doing exceedingly well.  Blood pressure is well-controlled today 126/70.  His body mass index is near normal.  Cholesterol is due to be assessed but has been well controlled.  EKG shows sinus bradycardia at 52 without ischemic changes.  He denies any chest pain or worsening shortness of breath.  03/19/2018  Charles Oconnor is seen today in follow-up.  He recently underwent cervical surgery for pain.  He had a fusion of the lower cervical vertebrae by Dr. Trenton Gammon and is doing much better.  He did have postoperative swelling and had steroids because of difficulty breathing.  Lipids a year ago were well controlled.  He has an upcoming appoint with Dr. Manuella Ghazi who will check his cholesterol in about 2 months.  I asked him to send [place] those records.  He denies any chest pain or shortness of breath.  Blood pressure is well controlled.  He did recently retire.  03/23/2019  Charles Oconnor is seen today for follow-up.  He reports some progressively worsening shortness of breath with exertion.  He said this has been going on for several months however recently  had an intestinal obstruction.  He said he was up in [place] and started having symptoms and then came here and went to [facility].  A CT scan indicated small bowel obstruction.  He has a history of prior surgeries including a robotic prostatectomy and appendectomy and therefore had surgical adhesions.  He is improving from that but does report that he has had some worsening symptoms.  He is concerned about coronary disease.  I performed his last heart catheterization in 2016 which showed some mild to moderate nonobstructive coronary disease.  His arteries actually looked somewhat better than it had previously.  His most recent lipid profile showed total cholesterol 153, triglycerides 179, HDL 39 and LDL 78.  05/10/2019  Charles Oconnor returns today for follow-up of his CT coronary angiogram.  He reports persistent shortness of breath with exertion.  His CT coronary angiogram was markedly abnormal demonstrating a high coronary calcium score 1119 which was 88th percentile for age and sex matched control.  There is heavy calcification of the right coronary and possible severe stenosis.  The mid LAD was also calcified and it was felt he had at least CAD RADS 3 disease.  CT FFR was sent and however did not demonstrate any significant stenosis.  Although it is possible for a  false negative FFR, it is fairly unlikely.  That being said he has extensive coronary disease and will need additional aggressive risk factor modification.  He is currently on pravastatin 80 mg and LDL remains above target at 78.   08/10/2019  Charles Oconnor is seen today in follow-up.  He still endorses shortness of breath.  He also has some anxiety.  This seems to be worsening he thinks.  He had recent pulmonary function testing which does show some mild reduction in FEV1 and FVC with some improvement after bronchodilator.  He has follow-up with Dr. Vaughan Browner in pulmonary later this month.  I wonder however if his progressive dyspnea is his  multivessel disease.  It could very well be is ischemic equivalent.  After switching him to rosuvastatin, he has had marked improvement in his lipids.  Total cholesterol now 125, triglycerides 137, HDL 40 and LDL 61.  Unfortunately, he reports intolerance of the statin causing what he feels is myalgias and some joint pain.  09/22/2019  Charles Oconnor returns today for follow-up.  He reports possibly some improvement in his dyspnea with Imdur.  He underwent a CT scan high resolution by Dr. Vaughan Browner, this demonstrated no evidence of any interstitial lung disease.  It is thought probably from a pulmonary standpoint that it is not explaining his shortness of breath.  As mentioned above he underwent cardiac catheterization by myself in 2016 which showed mild to moderate nonobstructive coronary disease.  That being said he had a lot of coronary calcium recently by CT coronary angiography, but FFR was negative.  I am concerned that this may be a false negative and that his symptoms represent more significant coronary disease.  01/26/2020  Charles Oconnor is seen today in follow-up.  He will underwent cardiopulmonary exercise testing this July for exercise-induced dyspnea.  Despite this he did an excellent exercise effort with normal functional capacity and no indication of cardiopulmonary limitation.  There was some restrictive lung physiology and he is noted to have an elevated right hemidiaphragm.  Other than that it was thought that maybe there was some contribution of body habitus and exercise intolerance and may be some hyperventilation at peak exercise.  Despite the fact that he had significant coronary calcification there was no significant obstruction and therefore we will continue to manage this medically but cannot find a clear reason as to why he has the shortness of breath other than possibly small vessel disease.  03/27/2021  Charles Oconnor is seen today in follow-up.  Overall he says he is doing well.  He is  been walking with his wife about 2-2 and half miles every day for the past several months.  He thinks this may have made an impact on his cholesterol and A1c and would like those rechecked today.  He denies any chest pain or worsening shortness of breath.  Blood pressure is in the low normal range.  Could possibly be due to more activity and weight loss.  I advised him to monitor for symptoms with that as we might consider decreasing his medications.  PMHx:  Past Medical History:  Diagnosis Date   Arthritis    Back knees   Cervical spinal stenosis    Complication of anesthesia    Coronary artery disease    mild to moderate   Dyslipidemia    Dyspnea    ED (erectile dysfunction)    GERD (gastroesophageal reflux disease)    Hypertension    PONV (postoperative nausea and vomiting)  Happened once 30 yrs ago. No problems since.   Prostate cancer [facility])     Past Surgical History:  Procedure Laterality Date   ANTERIOR CERVICAL DECOMPRESSION/DISCECTOMY FUSION 4 LEVELS N/A 09/22/2017   Procedure: ACDF - C3-C4 - C4-C5 - C5-C6 - C6-C7;  Surgeon: Earnie Larsson, MD;  Location: [facility];  Service: Neurosurgery;  Laterality: N/A;   APPENDECTOMY  1985   CARDIAC CATHETERIZATION  2002, 2011   mild-mod CAD   CARDIAC CATHETERIZATION N/A 03/12/2015   Procedure: Left Heart Cath and Coronary Angiography;  Surgeon: Pixie Casino, MD;  Location: [facility];  Service: Cardiovascular;  Laterality: N/A;   COLON RESECTION N/A 02/22/2019   Procedure: LAPAROSCOPIC COLON RESECTION AND DIVERTICULECTOMY;  Surgeon: Kieth Brightly, Arta Bruce, MD;  Location: [facility];  Service: General;  Laterality: N/A;   HAND SURGERY Left    had joints fused   JOINT REPLACEMENT Right 04/11/2009   LAPAROSCOPIC LYSIS OF ADHESIONS N/A 02/22/2019   Procedure: LAPAROSCOPIC LYSIS OF ADHESIONS;  Surgeon: Mickeal Skinner, MD;  Location: [facility];  Service: General;  Laterality: N/A;   PROSTATECTOMY  03/2007   RIGHT/LEFT HEART CATH AND  CORONARY ANGIOGRAPHY N/A 09/27/2019   Procedure: RIGHT/LEFT HEART CATH AND CORONARY ANGIOGRAPHY;  Surgeon: Jolaine Artist, MD;  Location: [facility];  Service: Cardiovascular;  Laterality: N/A;   TOTAL KNEE ARTHROPLASTY  04/2009   TOTAL KNEE ARTHROPLASTY Left 12/19/2019   Procedure: TOTAL KNEE ARTHROPLASTY;  Surgeon: Gaynelle Arabian, MD;  Location: [facility];  Service: Orthopedics;  Laterality: Left;  50 mins    FAMHx:  Family History  Problem Relation Age of Onset   Heart attack Father    Diabetes Mother    Heart failure Mother    Coronary artery disease Mother    Stroke Mother    Prostate cancer Brother    Heart attack Brother        also CVA   Heart attack Brother        also CVA   Cancer Brother     SOCHx:   reports that he has never smoked. He has never used smokeless tobacco. He reports that he does not drink alcohol and does not use drugs.  ALLERGIES:  Allergies  Allergen Reactions   Zocor [Simvastatin] Nausea Only and Other (See Comments)    Elevate liver enzymes    ROS: Pertinent items noted in HPI and remainder of comprehensive ROS otherwise negative.  HOME MEDS: Current Outpatient Medications  Medication Sig Dispense Refill   amLODipine (NORVASC) 10 MG tablet Take 10 mg by mouth daily.     gabapentin (NEURONTIN) 300 MG capsule Take 300 mg by mouth 2 (two) times daily.     isosorbide mononitrate (IMDUR) 30 MG 24 hr tablet Take 1 tablet by mouth once daily 90 tablet 0   lisinopril-hydrochlorothiazide (ZESTORETIC) 20-12.5 MG tablet Take 2 tablets by mouth daily. 180 tablet 0   pantoprazole (PROTONIX) 40 MG tablet Take 40 mg by mouth daily.      rosuvastatin (CRESTOR) 40 MG tablet Take 1 tablet by mouth once daily 90 tablet 3   No current facility-administered medications for this visit.    LABS/IMAGING: No results found for this or any previous visit (from the past 48 hour(s)). No results found.  VITALS: BP 107/65   Pulse (!) 59   Ht 6\' 1"  (1.854  m)   Wt 195 lb 3.2 oz (88.5 kg)   SpO2 98%   BMI 25.75 kg/m  EXAM: General appearance: alert and no distress Neck: no carotid bruit, no JVD and thyroid not enlarged, symmetric, no tenderness/mass/nodules Lungs: clear to auscultation bilaterally Heart: regular rate and rhythm, S1, S2 normal, no murmur, click, rub or gallop Abdomen: soft, non-tender; bowel sounds normal; no masses,  no organomegaly Extremities: extremities normal, atraumatic, no cyanosis or edema Pulses: 2+ and symmetric Skin: Skin color, texture, turgor normal. No rashes or lesions Neurologic: Grossly normal Psych: Pleasant  EKG: Sinus bradycardia at 59 -personally reviewed  ASSESSMENT: History of dyspnea on exertion/chest pressure -high CAC score of 1119, moderate to severe nonobstructive coronary disease and negative by FFR (04/2019) Mild nonobstructive coronary disease by cath (2016), somewhat improved compared to his prior study in 2011 -left and right heart catheterization in May 2021 showed mild nonobstructive CAD unchanged from 2016, normal LVEF and possible diastolic dysfunction.  Cardiopulmonary exercise testing demonstrated essentially normal findings. Mild to moderate abnormalities in pulmonary function Hypertension-controlled Dyslipidemia - controlled Prediabetes-A1c 6.2%  PLAN: 1.   Charles Oconnor seems to be feeling much better.  His last catheterization in 2021 showed no significant change in mild nonobstructive coronary disease, despite a very high calcium score.  His blood pressure is well controlled if not low normal.  Cholesterol has been controlled however he has been exercising more and would like to have that and his A1c reassessed.  He does have a history of prediabetes.  Follow-up with me annually or sooner as necessary.  Pixie Casino, MD, [facility], [place] Director of the [facility] Diplomate of the  American Board of Clinical Lipidology Attending Cardiologist  Direct Dial: [phone]  Fax: [phone]  Website:  [email or web address] 03/27/2021, 9:38 AM

## 2021-04-01 DIAGNOSIS — Z20822 Contact with and (suspected) exposure to covid-19: Secondary | ICD-10-CM | POA: Diagnosis not present

## 2021-04-11 DIAGNOSIS — M65332 Trigger finger, left middle finger: Secondary | ICD-10-CM | POA: Diagnosis not present

## 2021-04-11 DIAGNOSIS — M65331 Trigger finger, right middle finger: Secondary | ICD-10-CM | POA: Diagnosis not present

## 2021-04-26 ENCOUNTER — Other Ambulatory Visit: Payer: Self-pay | Admitting: Internal Medicine

## 2021-05-23 DIAGNOSIS — S61402A Unspecified open wound of left hand, initial encounter: Secondary | ICD-10-CM | POA: Diagnosis not present

## 2021-05-23 DIAGNOSIS — Z23 Encounter for immunization: Secondary | ICD-10-CM | POA: Diagnosis not present

## 2021-06-16 DIAGNOSIS — Z20822 Contact with and (suspected) exposure to covid-19: Secondary | ICD-10-CM | POA: Diagnosis not present

## 2021-06-17 IMAGING — DX DG CHEST 1V PORT
2 series · 2 of 2 positions shown · non-contrast
Comparison: 09/24/2017

CLINICAL DATA: Chest, abdominal pain, vomiting

EXAM:
PORTABLE CHEST 1 VIEW

[chest ap (1 of 2)]
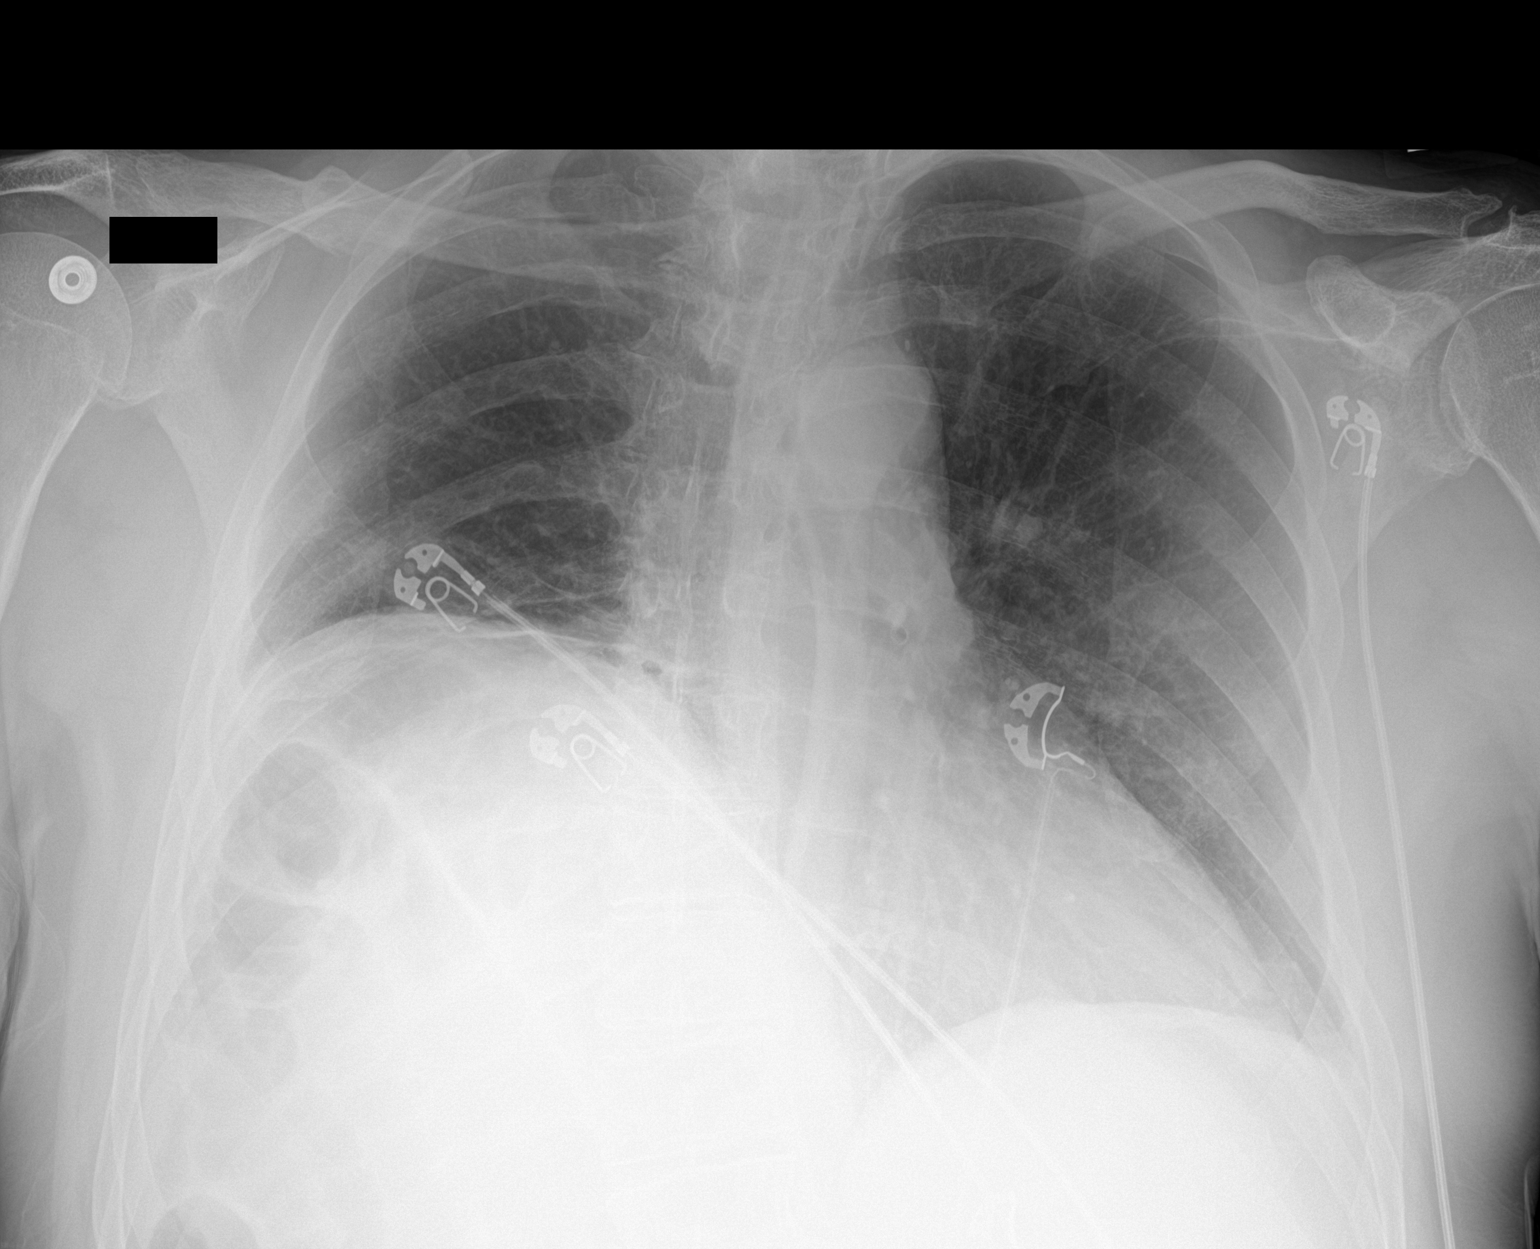

[chest ap (2 of 2)]
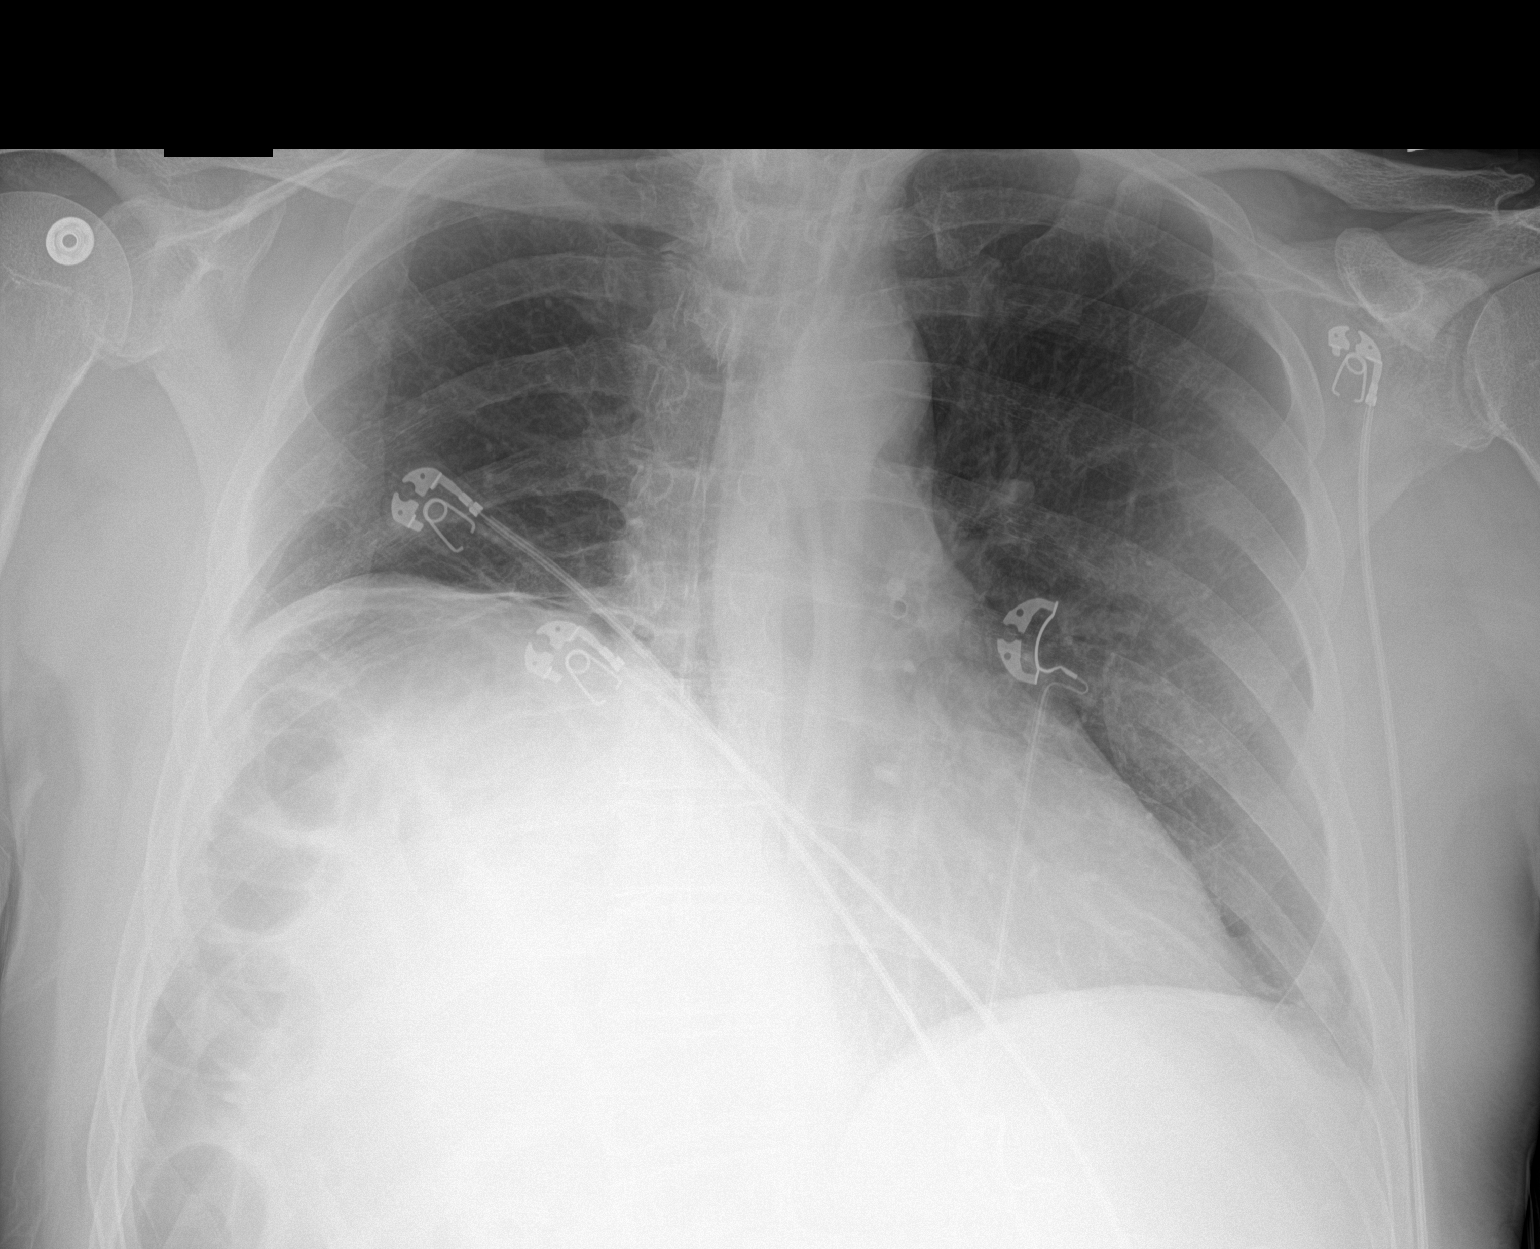

[2 of 2 positions shown; findings below may reference images not displayed]

FINDINGS: Elevation of the right hemidiaphragm. Right base atelectasis or
scarring. Left lung clear. Heart is normal size. No effusions or
acute bony abnormality.
IMPRESSION: Elevation of the right hemidiaphragm with right base atelectasis or
scarring. No acute cardiopulmonary disease.

## 2021-06-17 IMAGING — CT CT ABD-PELV W/ CM
2 of 5 series · 10 of 46 positions shown, 11 images · IV contrast (OMNIPAQUE 300)
Comparison: CT the abdomen and pelvis 01/06/2010.
COMPARISON: CT the abdomen and pelvis 01/06/2010.

Addendum:
CLINICAL DATA: 68-year-old male with history of acute generalized
abdominal pain.

EXAM:
CT ABDOMEN AND PELVIS WITH CONTRAST
TECHNIQUE: Multidetector CT imaging of the abdomen and pelvis was performed
using the standard protocol following bolus administration of
intravenous contrast.
CONTRAST:  100mL OMNIPAQUE IOHEXOL 300 MG/ML  SOLN

[Series 2: axial st · axial · 0.80mm/px · z∈[-283,+212]mm · 7 of 118 slices shown, 8 images]
[im 10/118  soft-tissue]
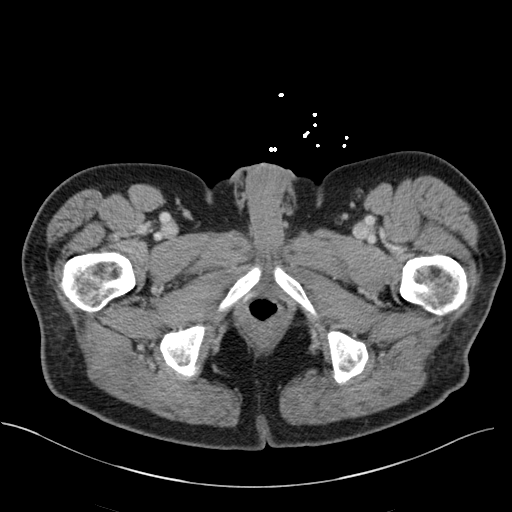
[im 10/118  bone]
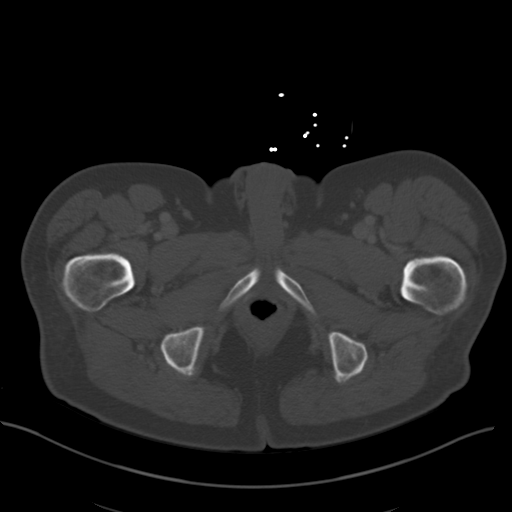
[im 28/118  soft-tissue]
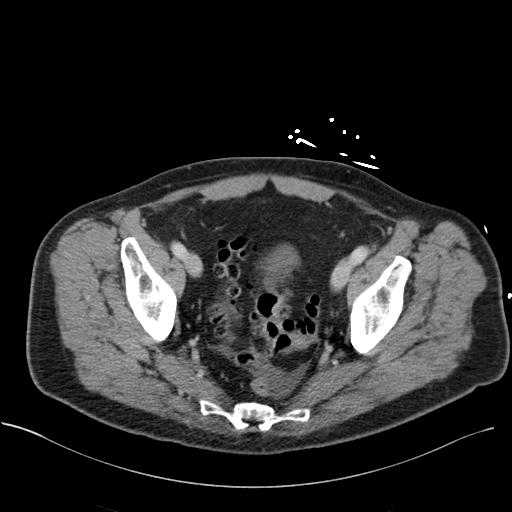
[im 46/118  soft-tissue]
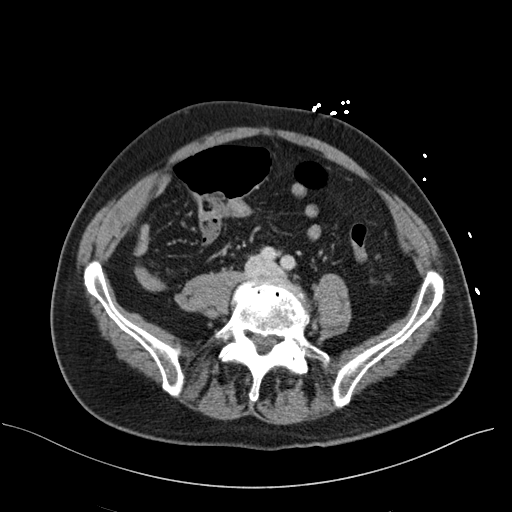
[im 64/118  soft-tissue]
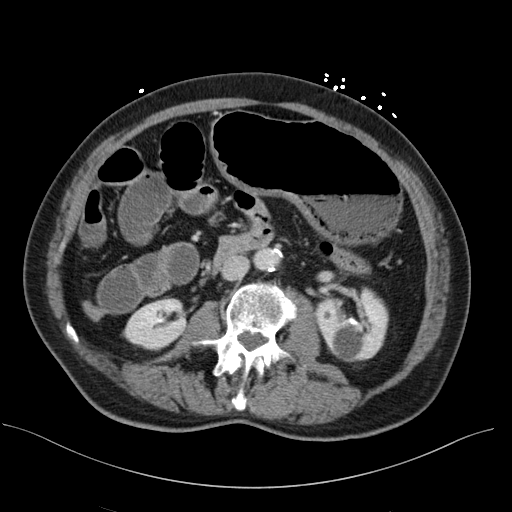
[im 73/118  soft-tissue]
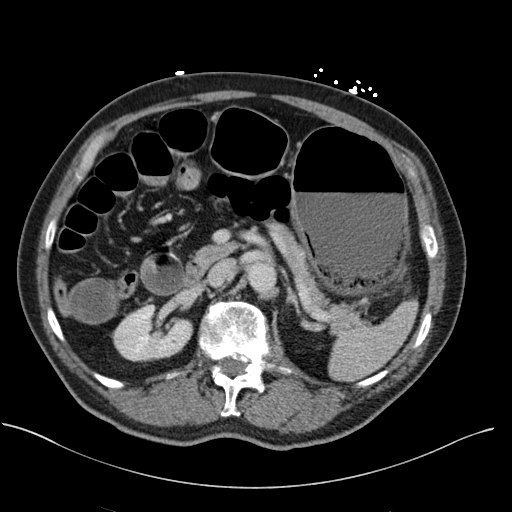
[im 91/118  soft-tissue]
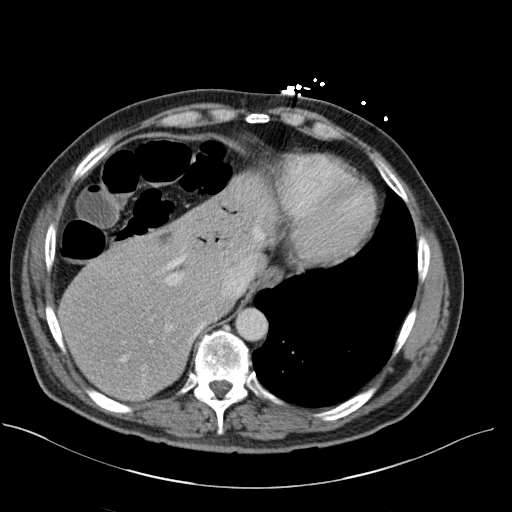
[im 109/118  soft-tissue]
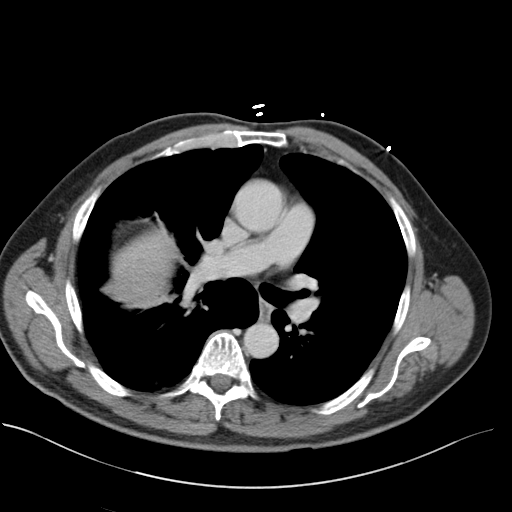

[Series 5: coronal st · coronal · 0.72mm/px · 3 of 151 slices shown]
[im 51/151  soft-tissue]
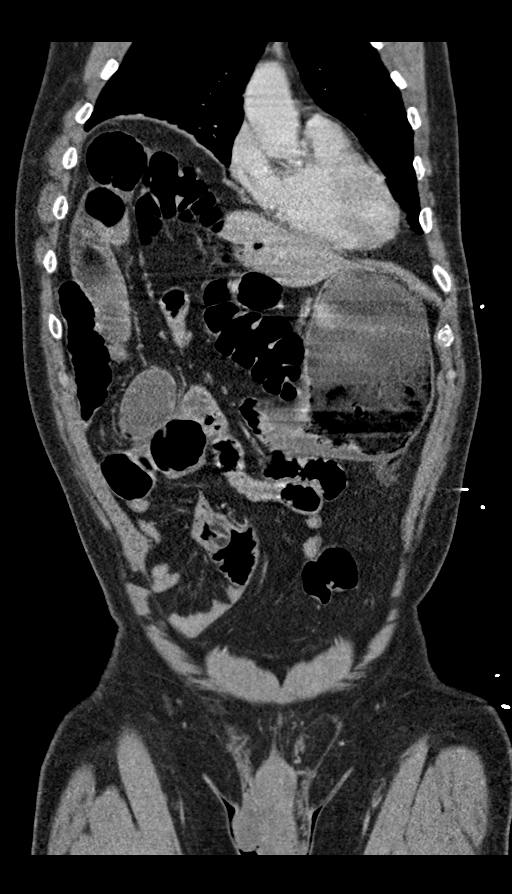
[im 67/151  soft-tissue]
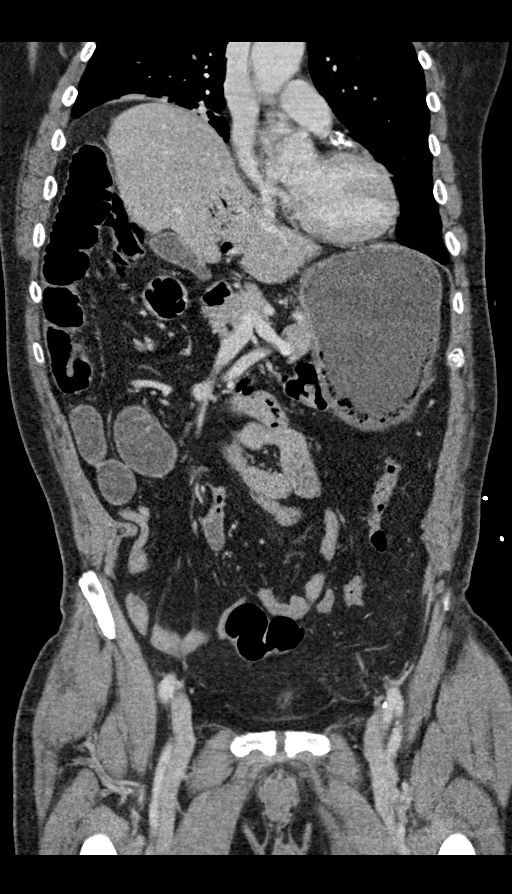
[im 84/151  soft-tissue]
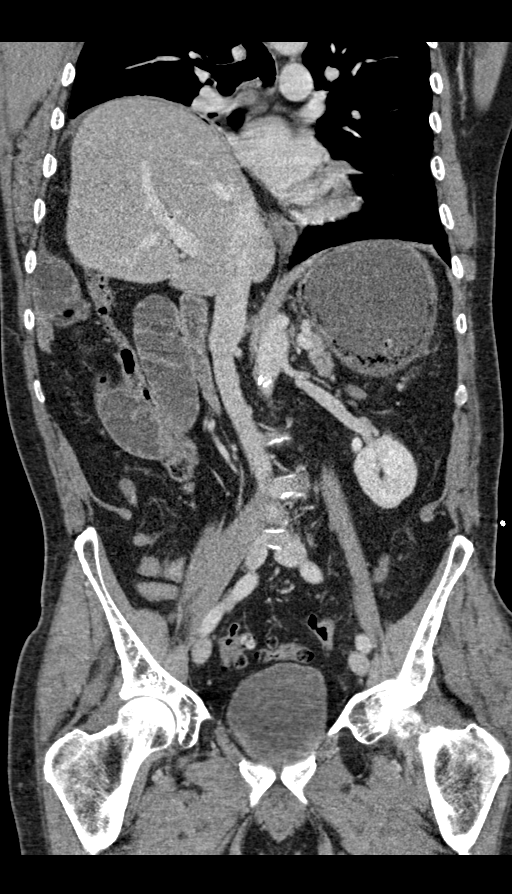

[10 of 46 positions shown; findings below may reference images not displayed]

FINDINGS: Lower chest: Atherosclerotic calcifications in the descending
thoracic aorta as well as the left anterior descending and right
coronary arteries. Elevation of the right hemidiaphragm with
subsegmental atelectasis in the right lung base. Scarring or
subsegmental atelectasis in the inferior segment of the lingula.

Hepatobiliary: Subcentimeter low-attenuation lesion in segment 5 of
the liver, too small to characterize, but statistically likely to
represent tiny cysts. No other larger more suspicious appearing
cystic or solid hepatic lesions. No intra or extrahepatic biliary
ductal dilatation. Calcified gallstones in the gallbladder measuring
up to 7 mm. No findings to suggest an acute cholecystitis at this
time. Pneumobilia, most evident throughout the left lobe of the
liver, presumably from prior sphincterotomy.

Pancreas: No pancreatic mass. No pancreatic ductal dilatation. No
pancreatic or peripancreatic fluid collections or inflammatory
changes.

Spleen: Unremarkable.

Adrenals/Urinary Tract: Low-attenuation nonenhancing lesions in both
kidneys measuring up to 3 cm in the posterior aspect of the
interpolar region of the left kidney, compatible with simple cysts.
Other subcentimeter low-attenuation lesions in both kidneys, too
small to characterize, but statistically likely to represent tiny
cysts. Bilateral adrenal glands are normal in appearance. No
hydroureteronephrosis. Urinary bladder is normal in appearance.

Stomach/Bowel: Normal appearance of the stomach. Proximal and mid
small bowel appear relatively decompressed. Several dilated loops of
mid to distal small bowel measuring up to 4 cm in diameter with
air-fluid levels. Distal and terminal ileum is decompressed.
Numerous colonic diverticulae are noted, without definite
surrounding inflammatory changes to suggest an acute diverticulitis
at this time. The appendix is not confidently identified and may be
surgically absent. Regardless, there are no inflammatory changes
noted adjacent to the cecum to suggest the presence of an acute
appendicitis at this time.

Vascular/Lymphatic: Aortic atherosclerosis, without evidence of
aneurysm or dissection in the abdominal or pelvic vasculature. No
lymphadenopathy noted in the abdomen or pelvis.

Reproductive: Status post radical prostatectomy.

Other: No significant volume of ascites.  No pneumoperitoneum.

Musculoskeletal: There are no aggressive appearing lytic or blastic
lesions noted in the visualized portions of the skeleton.
IMPRESSION: 1. Segmental dilatation of portions of the mid to distal small bowel
with air-fluid levels, the appearance of which suggests a potential
closed loop small bowel obstruction. Surgical consultation is
recommended.
2. No pneumoperitoneum to suggest perforation at this time. There is
a small volume of ascites.
3. Cholelithiasis without evidence of acute cholecystitis.
4. Colonic diverticulosis.
5. Aortic atherosclerosis, in addition to 2 vessel coronary artery
disease. Please note that although the presence of coronary artery
calcium documents the presence of coronary artery disease, the
severity of this disease and any potential stenosis cannot be
assessed on this non-gated CT examination. Assessment for potential
risk factor modification, dietary therapy or pharmacologic therapy
may be warranted, if clinically indicated.
6. Additional incidental findings, as above.

ADDENDUM:
Please note there is a large amount of portal venous gas extending
into the left portal vein and peripheral portal venous branches in
the left lobe.

There is thickened and edematous appearance of the wall of the
stomach primarily involving the dependent wall of the gastric fundus
and gastric body. There are pockets of air along the dependent wall
of the stomach most consistent with pneumatosis.

There is a focal area of inflammatory changes adjacent to the
greater curvature of the body of the stomach. There is a thin linear
air within a small vessel adjacent to the greater curvature of the
stomach (coronal series 5 image 50 and 51 and axial series 2, image
62).

Dilated loops of small bowel the right hemiabdomen with air-fluid
levels as previously described. These likely represent a reactive
ileus and less likely a small-bowel obstruction.

The above findings were discussed with Dr. Moolman at 10 p.m. on
02/22/2019.

*** End of Addendum ***
FINDINGS: Lower chest: Atherosclerotic calcifications in the descending
thoracic aorta as well as the left anterior descending and right
coronary arteries. Elevation of the right hemidiaphragm with
subsegmental atelectasis in the right lung base. Scarring or
subsegmental atelectasis in the inferior segment of the lingula.

Hepatobiliary: Subcentimeter low-attenuation lesion in segment 5 of
the liver, too small to characterize, but statistically likely to
represent tiny cysts. No other larger more suspicious appearing
cystic or solid hepatic lesions. No intra or extrahepatic biliary
ductal dilatation. Calcified gallstones in the gallbladder measuring
up to 7 mm. No findings to suggest an acute cholecystitis at this
time. Pneumobilia, most evident throughout the left lobe of the
liver, presumably from prior sphincterotomy.

Pancreas: No pancreatic mass. No pancreatic ductal dilatation. No
pancreatic or peripancreatic fluid collections or inflammatory
changes.

Spleen: Unremarkable.

Adrenals/Urinary Tract: Low-attenuation nonenhancing lesions in both
kidneys measuring up to 3 cm in the posterior aspect of the
interpolar region of the left kidney, compatible with simple cysts.
Other subcentimeter low-attenuation lesions in both kidneys, too
small to characterize, but statistically likely to represent tiny
cysts. Bilateral adrenal glands are normal in appearance. No
hydroureteronephrosis. Urinary bladder is normal in appearance.

Stomach/Bowel: Normal appearance of the stomach. Proximal and mid
small bowel appear relatively decompressed. Several dilated loops of
mid to distal small bowel measuring up to 4 cm in diameter with
air-fluid levels. Distal and terminal ileum is decompressed.
Numerous colonic diverticulae are noted, without definite
surrounding inflammatory changes to suggest an acute diverticulitis
at this time. The appendix is not confidently identified and may be
surgically absent. Regardless, there are no inflammatory changes
noted adjacent to the cecum to suggest the presence of an acute
appendicitis at this time.

Vascular/Lymphatic: Aortic atherosclerosis, without evidence of
aneurysm or dissection in the abdominal or pelvic vasculature. No
lymphadenopathy noted in the abdomen or pelvis.

Reproductive: Status post radical prostatectomy.

Other: No significant volume of ascites.  No pneumoperitoneum.

Musculoskeletal: There are no aggressive appearing lytic or blastic
lesions noted in the visualized portions of the skeleton.
IMPRESSION: 1. Segmental dilatation of portions of the mid to distal small bowel
with air-fluid levels, the appearance of which suggests a potential
closed loop small bowel obstruction. Surgical consultation is
recommended.
2. No pneumoperitoneum to suggest perforation at this time. There is
a small volume of ascites.
3. Cholelithiasis without evidence of acute cholecystitis.
4. Colonic diverticulosis.
5. Aortic atherosclerosis, in addition to 2 vessel coronary artery
disease. Please note that although the presence of coronary artery
calcium documents the presence of coronary artery disease, the
severity of this disease and any potential stenosis cannot be
assessed on this non-gated CT examination. Assessment for potential
risk factor modification, dietary therapy or pharmacologic therapy
may be warranted, if clinically indicated.
6. Additional incidental findings, as above.

## 2021-06-26 DIAGNOSIS — G4700 Insomnia, unspecified: Secondary | ICD-10-CM | POA: Diagnosis not present

## 2021-06-26 DIAGNOSIS — Z0000 Encounter for general adult medical examination without abnormal findings: Secondary | ICD-10-CM | POA: Diagnosis not present

## 2021-06-26 DIAGNOSIS — M549 Dorsalgia, unspecified: Secondary | ICD-10-CM | POA: Diagnosis not present

## 2021-06-26 DIAGNOSIS — R7303 Prediabetes: Secondary | ICD-10-CM | POA: Diagnosis not present

## 2021-06-26 DIAGNOSIS — I2510 Atherosclerotic heart disease of native coronary artery without angina pectoris: Secondary | ICD-10-CM | POA: Diagnosis not present

## 2021-06-26 DIAGNOSIS — J986 Disorders of diaphragm: Secondary | ICD-10-CM | POA: Diagnosis not present

## 2021-06-26 DIAGNOSIS — E782 Mixed hyperlipidemia: Secondary | ICD-10-CM | POA: Diagnosis not present

## 2021-06-26 DIAGNOSIS — Z8546 Personal history of malignant neoplasm of prostate: Secondary | ICD-10-CM | POA: Diagnosis not present

## 2021-06-26 DIAGNOSIS — R7301 Impaired fasting glucose: Secondary | ICD-10-CM | POA: Diagnosis not present

## 2021-06-26 DIAGNOSIS — M6530 Trigger finger, unspecified finger: Secondary | ICD-10-CM | POA: Diagnosis not present

## 2021-06-26 DIAGNOSIS — I119 Hypertensive heart disease without heart failure: Secondary | ICD-10-CM | POA: Diagnosis not present

## 2021-07-25 ENCOUNTER — Other Ambulatory Visit: Payer: Self-pay | Admitting: Internal Medicine

## 2021-08-01 DIAGNOSIS — C61 Malignant neoplasm of prostate: Secondary | ICD-10-CM | POA: Diagnosis not present

## 2021-08-08 DIAGNOSIS — C61 Malignant neoplasm of prostate: Secondary | ICD-10-CM | POA: Diagnosis not present

## 2021-08-08 DIAGNOSIS — N393 Stress incontinence (female) (male): Secondary | ICD-10-CM | POA: Diagnosis not present

## 2021-08-12 DIAGNOSIS — M65332 Trigger finger, left middle finger: Secondary | ICD-10-CM | POA: Diagnosis not present

## 2021-09-04 DIAGNOSIS — M65332 Trigger finger, left middle finger: Secondary | ICD-10-CM | POA: Diagnosis not present

## 2021-09-05 DIAGNOSIS — D1801 Hemangioma of skin and subcutaneous tissue: Secondary | ICD-10-CM | POA: Diagnosis not present

## 2021-09-05 DIAGNOSIS — L821 Other seborrheic keratosis: Secondary | ICD-10-CM | POA: Diagnosis not present

## 2021-09-05 DIAGNOSIS — L570 Actinic keratosis: Secondary | ICD-10-CM | POA: Diagnosis not present

## 2021-09-05 DIAGNOSIS — Z85828 Personal history of other malignant neoplasm of skin: Secondary | ICD-10-CM | POA: Diagnosis not present

## 2021-09-05 DIAGNOSIS — D692 Other nonthrombocytopenic purpura: Secondary | ICD-10-CM | POA: Diagnosis not present

## 2021-09-05 DIAGNOSIS — L814 Other melanin hyperpigmentation: Secondary | ICD-10-CM | POA: Diagnosis not present

## 2021-09-05 DIAGNOSIS — D2271 Melanocytic nevi of right lower limb, including hip: Secondary | ICD-10-CM | POA: Diagnosis not present

## 2021-09-18 DIAGNOSIS — M79642 Pain in left hand: Secondary | ICD-10-CM | POA: Diagnosis not present

## 2021-10-02 DIAGNOSIS — M79642 Pain in left hand: Secondary | ICD-10-CM | POA: Diagnosis not present

## 2021-10-10 ENCOUNTER — Other Ambulatory Visit: Payer: Self-pay

## 2021-10-10 MED ORDER — ISOSORBIDE MONONITRATE ER 30 MG PO TB24: 30.0000 mg | ORAL_TABLET | Freq: Every day | ORAL | 3 refills | Status: DC

## 2021-10-14 ENCOUNTER — Ambulatory Visit (INDEPENDENT_AMBULATORY_CARE_PROVIDER_SITE_OTHER): Payer: Medicare Other

## 2021-10-14 ENCOUNTER — Encounter: Payer: Self-pay | Admitting: Internal Medicine

## 2021-10-14 ENCOUNTER — Telehealth: Payer: Self-pay | Admitting: *Deleted

## 2021-10-14 DIAGNOSIS — R002 Palpitations: Secondary | ICD-10-CM | POA: Diagnosis not present

## 2021-10-14 NOTE — Telephone Encounter (Signed)
Since his baseline heart rate is low, I would avoid just empirically prescribing beta blocker or diltiazem.  Can we please have him wear a 72-h monitor first, before we decide if the pulse irregularity needs to be treated ?(may be best to just leave it alone).

## 2021-10-14 NOTE — Progress Notes (Unsigned)
[id number] ZIO XT from office inventory given to patient. Neeral from [employer] to update enrollment with serial #.

## 2021-10-14 NOTE — Telephone Encounter (Signed)
[employer] contacted to cancel shipment of 3 day ZIO XT monitor and to assign serial # [id number] to the patients enrollment.  Patient will pick monitor up at front desk today.

## 2021-10-25 DIAGNOSIS — R002 Palpitations: Secondary | ICD-10-CM | POA: Diagnosis not present

## 2021-10-29 NOTE — Telephone Encounter (Signed)
Patient called with monitor results. He is NOT symptomatic with palpitations/PSVT/PVCs. He had messaged in about feeling skipped beats when he checked his pulse. He would like to monitor symptoms for now

## 2021-12-23 DIAGNOSIS — M25571 Pain in right ankle and joints of right foot: Secondary | ICD-10-CM | POA: Diagnosis not present

## 2021-12-23 DIAGNOSIS — I2510 Atherosclerotic heart disease of native coronary artery without angina pectoris: Secondary | ICD-10-CM | POA: Diagnosis not present

## 2021-12-23 DIAGNOSIS — R7303 Prediabetes: Secondary | ICD-10-CM | POA: Diagnosis not present

## 2021-12-23 DIAGNOSIS — I471 Supraventricular tachycardia: Secondary | ICD-10-CM | POA: Diagnosis not present

## 2021-12-23 DIAGNOSIS — E782 Mixed hyperlipidemia: Secondary | ICD-10-CM | POA: Diagnosis not present

## 2021-12-23 DIAGNOSIS — I119 Hypertensive heart disease without heart failure: Secondary | ICD-10-CM | POA: Diagnosis not present

## 2022-02-03 DIAGNOSIS — Z23 Encounter for immunization: Secondary | ICD-10-CM | POA: Diagnosis not present

## 2022-02-21 DIAGNOSIS — Z23 Encounter for immunization: Secondary | ICD-10-CM | POA: Diagnosis not present

## 2022-03-10 ENCOUNTER — Other Ambulatory Visit: Payer: Self-pay | Admitting: Internal Medicine

## 2022-05-22 ENCOUNTER — Other Ambulatory Visit: Payer: Self-pay | Admitting: Internal Medicine

## 2022-05-29 ENCOUNTER — Ambulatory Visit: Payer: Medicare Other | Attending: Internal Medicine | Admitting: Internal Medicine

## 2022-05-29 ENCOUNTER — Encounter: Payer: Self-pay | Admitting: Internal Medicine

## 2022-05-29 VITALS — BP 100/58 | HR 47 | Ht 73.0 in | Wt 189.6 lb

## 2022-05-29 DIAGNOSIS — E785 Hyperlipidemia, unspecified: Secondary | ICD-10-CM | POA: Diagnosis not present

## 2022-05-29 DIAGNOSIS — I2510 Atherosclerotic heart disease of native coronary artery without angina pectoris: Secondary | ICD-10-CM | POA: Diagnosis not present

## 2022-05-29 DIAGNOSIS — R002 Palpitations: Secondary | ICD-10-CM | POA: Diagnosis not present

## 2022-05-29 DIAGNOSIS — I10 Essential (primary) hypertension: Secondary | ICD-10-CM | POA: Diagnosis not present

## 2022-05-29 DIAGNOSIS — I2583 Coronary atherosclerosis due to lipid rich plaque: Secondary | ICD-10-CM | POA: Diagnosis not present

## 2022-05-29 MED ORDER — AMLODIPINE BESYLATE 5 MG PO TABS: 5.0000 mg | ORAL_TABLET | Freq: Every day | ORAL | 3 refills | Status: DC

## 2022-05-29 NOTE — Progress Notes (Signed)
OFFICE NOTE  Chief Complaint:  Follow-up  Primary Care Physician: Mayra Neer, MD  HPI:  Charles Oconnor  is a 72 year old gentleman with history of hypertension, dyslipidemia and mild to moderate coronary disease. We have been following him for dyslipidemia and he has had liver enzyme abnormalities before on Zocor and was changed to Pravachol with pretty good control. Sometimes he gets chest discomfort; however, it is pretty short lived and possibly related to that. His EKG today shows a sinus bradycardia at 54 and I understand recently he was placed on a beta blocker which caused marked sinus bradycardia and that was promptly discontinued. At this point, although there is possibly an occasion for that given his history of coronary disease, his heart rate being low, he is unable to tolerate a beta blocker. He is on an ACE inhibitor which is pretty good at controlling his blood pressure as well as amlodipine.   Charles Oconnor returns today for followup. He reports doing fairly well. He's recently started doing some walking and has actually lost about 10 pounds in the last 6 months. He says he does feel better and have more energy. A portion is developed some peripheral neuropathy. He's been started on Neurontin by a neurologist in [place]. He seems to be tolerating this medicine although has some occasional lightheadedness and dizziness. He is peripheral nerve pain is better however does have numbness in his feet. He occasionally gets some mild swelling. He also was told that he had high vitamin B6 levels and was told to stop taking his multivitamin.  I the pleasure see Charles Oconnor back in the office today. Overall he is doing fairly well except he is struggling with low back pain and some neuropathic pain in his legs. He's been getting back injections without much improvement. He's also been describing some occasional chest discomfort and progressive fatigue over the past several months. He  feels like he has no energy. There is also complaints of mid back pain between his shoulder blades which occurs on unusual occasions and not associated with his low back pain. Sometimes it's a little worse when exerting himself although he's not been as active due to his back problems. As a reminder he had heart catheterization in 2011 with Dr. Tina Griffiths, which demonstrated mild to moderate coronary artery disease.  He recently had lab work through his primary care provider which looks fairly normal except for an elevated cholesterol profile. His total cholesterol is 166, HL 43, triglycerides 112 and LDL 101. He's currently on pravastatin 80 mg daily and says that he has had problems with myalgias and liver function abnormalities on simvastatin in the past.  Charles Oconnor returns today for follow-up. He recently underwent a stress test which showed the following:  The left ventricular ejection fraction is mildly decreased (45-54%). Nuclear stress EF: 52%. No T wave inversion was noted during stress. There was no ST segment deviation noted during stress. Defect 1: There is a medium defect of moderate severity.   There is a moderate-sized and intensity, partially reversible inferoseptal perfusion defect which could represent ischemia. LVEF 52% with basal inferoseptal dyskinesis. This is an intermediate risk study.     Based on these findings, I recommended either continued medical therapy or we could consider cardiac catheterization. Subsequently he felt like his chest pain was worsening and ultimately based on his prior catheterization results, we decided to proceed with cardiac catheterization. The results are as follows:  Prox RCA lesion, 40% stenosed. The lesion  was not previously treated. Prox Cx to Dist Cx lesion, 20% stenosed. The lesion was not previously treated. Ost 1st Diag to 1st Diag lesion, 40% stenosed. The lesion was not previously treated. Dist LAD lesion, 20% stenosed. The lesion was not  previously treated. The left ventricular systolic function is normal.   Mild, non-obstructive CAD - somewhat improved from prior cath in 2011. Normal LV function. False positive nuclear stress test. At this point, Charles Oconnor is doing much better and denies any chest pain or worsening shortness of breath. He gets a small amount of ankle edema which I attribute to amlodipine.  04/01/2016  Charles Oconnor returns today for follow-up. He denies any chest pain or worsening shortness of breath. He apparently had a sore throat yesterday and has some drainage today. He is interested in some over-the-counter medications for that. He's had no chest pain that sounds anginal. Blood pressure well controlled. Recent cholesterol check through his primary care provider shows good control with LDL less than 100.  03/13/2017  Charles Oconnor returns today for follow-up.  Overall he is doing exceedingly well.  Blood pressure is well-controlled today 126/70.  His body mass index is near normal.  Cholesterol is due to be assessed but has been well controlled.  EKG shows sinus bradycardia at 52 without ischemic changes.  He denies any chest pain or worsening shortness of breath.  03/19/2018  Charles Oconnor is seen today in follow-up.  He recently underwent cervical surgery for pain.  He had a fusion of the lower cervical vertebrae by Dr. Trenton Gammon and is doing much better.  He did have postoperative swelling and had steroids because of difficulty breathing.  Lipids a year ago were well controlled.  He has an upcoming appoint with Dr. Manuella Ghazi who will check his cholesterol in about 2 months.  I asked him to send [place] those records.  He denies any chest pain or shortness of breath.  Blood pressure is well controlled.  He did recently retire.  03/23/2019  Charles Oconnor is seen today for follow-up.  He reports some progressively worsening shortness of breath with exertion.  He said this has been going on for several months however recently had  an intestinal obstruction.  He said he was up in [place] and started having symptoms and then came here and went to [place] long.  A CT scan indicated small bowel obstruction.  He has a history of prior surgeries including a robotic prostatectomy and appendectomy and therefore had surgical adhesions.  He is improving from that but does report that he has had some worsening symptoms.  He is concerned about coronary disease.  I performed his last heart catheterization in 2016 which showed some mild to moderate nonobstructive coronary disease.  His arteries actually looked somewhat better than it had previously.  His most recent lipid profile showed total cholesterol 153, triglycerides 179, HDL 39 and LDL 78.  05/10/2019  Charles Oconnor returns today for follow-up of his CT coronary angiogram.  He reports persistent shortness of breath with exertion.  His CT coronary angiogram was markedly abnormal demonstrating a high coronary calcium score 1119 which was 88th percentile for age and sex matched control.  There is heavy calcification of the right coronary and possible severe stenosis.  The mid LAD was also calcified and it was felt he had at least CAD RADS 3 disease.  CT FFR was sent and however did not demonstrate any significant stenosis.  Although it is possible for a false  negative FFR, it is fairly unlikely.  That being said he has extensive coronary disease and will need additional aggressive risk factor modification.  He is currently on pravastatin 80 mg and LDL remains above target at 78.   08/10/2019  Charles Oconnor is seen today in follow-up.  He still endorses shortness of breath.  He also has some anxiety.  This seems to be worsening he thinks.  He had recent pulmonary function testing which does show some mild reduction in FEV1 and FVC with some improvement after bronchodilator.  He has follow-up with Dr. Vaughan Browner in pulmonary later this month.  I wonder however if his progressive dyspnea is his  multivessel disease.  It could very well be is ischemic equivalent.  After switching him to rosuvastatin, he has had marked improvement in his lipids.  Total cholesterol now 125, triglycerides 137, HDL 40 and LDL 61.  Unfortunately, he reports intolerance of the statin causing what he feels is myalgias and some joint pain.  09/22/2019  Charles Oconnor returns today for follow-up.  He reports possibly some improvement in his dyspnea with Imdur.  He underwent a CT scan high resolution by Dr. Vaughan Browner, this demonstrated no evidence of any interstitial lung disease.  It is thought probably from a pulmonary standpoint that it is not explaining his shortness of breath.  As mentioned above he underwent cardiac catheterization by myself in 2016 which showed mild to moderate nonobstructive coronary disease.  That being said he had a lot of coronary calcium recently by CT coronary angiography, but FFR was negative.  I am concerned that this may be a false negative and that his symptoms represent more significant coronary disease.  01/26/2020  Charles Oconnor is seen today in follow-up.  He will underwent cardiopulmonary exercise testing this July for exercise-induced dyspnea.  Despite this he did an excellent exercise effort with normal functional capacity and no indication of cardiopulmonary limitation.  There was some restrictive lung physiology and he is noted to have an elevated right hemidiaphragm.  Other than that it was thought that maybe there was some contribution of body habitus and exercise intolerance and may be some hyperventilation at peak exercise.  Despite the fact that he had significant coronary calcification there was no significant obstruction and therefore we will continue to manage this medically but cannot find a clear reason as to why he has the shortness of breath other than possibly small vessel disease.  03/27/2021  Charles Oconnor is seen today in follow-up.  Overall he says he is doing well.  He is  been walking with his wife about 2-2 and half miles every day for the past several months.  He thinks this may have made an impact on his cholesterol and A1c and would like those rechecked today.  He denies any chest pain or worsening shortness of breath.  Blood pressure is in the low normal range.  Could possibly be due to more activity and weight loss.  I advised him to monitor for symptoms with that as we might consider decreasing his medications.  05/29/2022  Charles Oconnor returns today for follow-up.  He denies any chest pain or worsening shortness of breath.  Over the summer he had had episodes with palpitations.  He wore a monitor which showed some PSVT as well as PVCs.  We discussed the possibility of adding antiarrhythmic therapy however he said that that his symptoms improved and he did not want to do it at that time.  Overall since then  it is improved.  Heart rate still remains low in the 40s and as low as 30s sometimes.  He is not on any rate lowering medications and denies any symptoms with that.  Blood pressure is also running low.  He has made major changes in his diet.  PMHx:  Past Medical History:  Diagnosis Date   Arthritis    Back knees   Cervical spinal stenosis    Complication of anesthesia    Coronary artery disease    mild to moderate   Dyslipidemia    Dyspnea    ED (erectile dysfunction)    GERD (gastroesophageal reflux disease)    Hypertension    PONV (postoperative nausea and vomiting)    Happened once 30 yrs ago. No problems since.   Prostate cancer [facility])     Past Surgical History:  Procedure Laterality Date   ANTERIOR CERVICAL DECOMPRESSION/DISCECTOMY FUSION 4 LEVELS N/A 09/22/2017   Procedure: ACDF - C3-C4 - C4-C5 - C5-C6 - C6-C7;  Surgeon: Earnie Larsson, MD;  Location: [facility];  Service: Neurosurgery;  Laterality: N/A;   APPENDECTOMY  1985   CARDIAC CATHETERIZATION  2002, 2011   mild-mod CAD   CARDIAC CATHETERIZATION N/A 03/12/2015   Procedure: Left Heart Cath  and Coronary Angiography;  Surgeon: Pixie Casino, MD;  Location: [facility];  Service: Cardiovascular;  Laterality: N/A;   COLON RESECTION N/A 02/22/2019   Procedure: LAPAROSCOPIC COLON RESECTION AND DIVERTICULECTOMY;  Surgeon: Kieth Brightly, Arta Bruce, MD;  Location: [facility];  Service: General;  Laterality: N/A;   HAND SURGERY Left    had joints fused   JOINT REPLACEMENT Right 04/11/2009   LAPAROSCOPIC LYSIS OF ADHESIONS N/A 02/22/2019   Procedure: LAPAROSCOPIC LYSIS OF ADHESIONS;  Surgeon: Mickeal Skinner, MD;  Location: [facility];  Service: General;  Laterality: N/A;   PROSTATECTOMY  03/2007   RIGHT/LEFT HEART CATH AND CORONARY ANGIOGRAPHY N/A 09/27/2019   Procedure: RIGHT/LEFT HEART CATH AND CORONARY ANGIOGRAPHY;  Surgeon: Jolaine Artist, MD;  Location: [facility];  Service: Cardiovascular;  Laterality: N/A;   TOTAL KNEE ARTHROPLASTY  04/2009   TOTAL KNEE ARTHROPLASTY Left 12/19/2019   Procedure: TOTAL KNEE ARTHROPLASTY;  Surgeon: Gaynelle Arabian, MD;  Location: [facility];  Service: Orthopedics;  Laterality: Left;  50 mins    FAMHx:  Family History  Problem Relation Age of Onset   Heart attack Father    Diabetes Mother    Heart failure Mother    Coronary artery disease Mother    Stroke Mother    Prostate cancer Brother    Heart attack Brother        also CVA   Heart attack Brother        also CVA   Cancer Brother     SOCHx:   reports that he has never smoked. He has never used smokeless tobacco. He reports that he does not drink alcohol and does not use drugs.  ALLERGIES:  Allergies  Allergen Reactions   Zocor [Simvastatin] Nausea Only and Other (See Comments)    Elevate liver enzymes    ROS: Pertinent items noted in HPI and remainder of comprehensive ROS otherwise negative.  HOME MEDS: Current Outpatient Medications  Medication Sig Dispense Refill   amLODipine (NORVASC) 10 MG tablet Take 10 mg by mouth daily.     gabapentin (NEURONTIN) 300 MG  capsule Take 300 mg by mouth 2 (two) times daily.     isosorbide mononitrate (IMDUR) 30 MG 24 hr tablet Take  1 tablet (30 mg total) by mouth daily. 90 tablet 3   lisinopril-hydrochlorothiazide (ZESTORETIC) 20-12.5 MG tablet Take 2 tablets by mouth daily. 180 tablet 0   pantoprazole (PROTONIX) 40 MG tablet Take 40 mg by mouth daily.      rosuvastatin (CRESTOR) 40 MG tablet Take 1 tablet (40 mg total) by mouth daily. Please keep appointment with cardiologist 30 tablet 1   No current facility-administered medications for this visit.    LABS/IMAGING: No results found for this or any previous visit (from the past 48 hour(s)). No results found.  VITALS: BP (!) 100/58   Pulse (!) 47   Ht '6\' 1"'$  (1.854 m)   Wt 189 lb 9.6 oz (86 kg)   SpO2 94%   BMI 25.01 kg/m   EXAM: General appearance: alert and no distress Neck: no carotid bruit, no JVD and thyroid not enlarged, symmetric, no tenderness/mass/nodules Lungs: clear to auscultation bilaterally Heart: regular rate and rhythm, S1, S2 normal, no murmur, click, rub or gallop Abdomen: soft, non-tender; bowel sounds normal; no masses,  no organomegaly Extremities: extremities normal, atraumatic, no cyanosis or edema Pulses: 2+ and symmetric Skin: Skin color, texture, turgor normal. No rashes or lesions Neurologic: Grossly normal Psych: Pleasant  EKG: Sinus bradycardia at 47-personally reviewed  ASSESSMENT: History of dyspnea on exertion/chest pressure -high CAC score of 1119, moderate to severe nonobstructive coronary disease and negative by FFR (04/2019) Mild nonobstructive coronary disease by cath (2016), somewhat improved compared to his prior study in 2011 -left and right heart catheterization in May 2021 showed mild nonobstructive CAD unchanged from 2016, normal LVEF and possible diastolic dysfunction.  Cardiopulmonary exercise testing demonstrated essentially normal findings. Mild to moderate abnormalities in pulmonary  function Hypertension-controlled Dyslipidemia - controlled Prediabetes-A1c 6.2%  PLAN: 1.   Charles Oconnor is doing well and generally feels asymptomatic.  He has had some persistent bradycardia but is asymptomatic with it.  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  He walks regularly on a almost daily basis.  I encouraged him to monitor this.  As his blood pressure continues to run low I would advise decreasing amlodipine from 10 to 5 mg daily.  He has lost weight and cut out sugar in his diet.    Follow-up with me annually or sooner as necessary.  Pixie Casino, MD, [facility], [place] Director of the Advanced Lipid Disorders &  Cardiovascular Risk Reduction Clinic Diplomate of the American Board of Clinical Lipidology Attending Cardiologist  Direct Dial: [phone]  Fax: [phone]  Website:  [email or web address] 05/29/2022, 9:20 AM

## 2022-05-29 NOTE — Patient Instructions (Signed)
Medication Instructions:  DECREASE amlodipine to '5mg'$  daily  *If you need a refill on your cardiac medications before your next appointment, please call your pharmacy*   Follow-Up: At [facility], you and your health needs are our priority.  As part of our continuing mission to provide you with exceptional heart care, we have created designated Provider Care Teams.  These Care Teams include your primary Cardiologist (physician) and Advanced Practice Providers (APPs -  Physician Assistants and Nurse Practitioners) who all work together to provide you with the care you need, when you need it.  We recommend signing up for the patient portal called "MyChart".  Sign up information is provided on this After Visit Summary.  MyChart is used to connect with patients for Virtual Visits (Telemedicine).  Patients are able to view lab/test results, encounter notes, upcoming appointments, etc.  Non-urgent messages can be sent to your provider as well.   To learn more about what you can do with MyChart, go to [email or web address].    Your next appointment:   12 month(s)  Provider:   Dr. Lyman Bishop

## 2022-07-03 DIAGNOSIS — I2510 Atherosclerotic heart disease of native coronary artery without angina pectoris: Secondary | ICD-10-CM | POA: Diagnosis not present

## 2022-07-03 DIAGNOSIS — Z8546 Personal history of malignant neoplasm of prostate: Secondary | ICD-10-CM | POA: Diagnosis not present

## 2022-07-03 DIAGNOSIS — J986 Disorders of diaphragm: Secondary | ICD-10-CM | POA: Diagnosis not present

## 2022-07-03 DIAGNOSIS — R7303 Prediabetes: Secondary | ICD-10-CM | POA: Diagnosis not present

## 2022-07-03 DIAGNOSIS — G4700 Insomnia, unspecified: Secondary | ICD-10-CM | POA: Diagnosis not present

## 2022-07-03 DIAGNOSIS — Z0000 Encounter for general adult medical examination without abnormal findings: Secondary | ICD-10-CM | POA: Diagnosis not present

## 2022-07-03 DIAGNOSIS — E782 Mixed hyperlipidemia: Secondary | ICD-10-CM | POA: Diagnosis not present

## 2022-07-03 DIAGNOSIS — I119 Hypertensive heart disease without heart failure: Secondary | ICD-10-CM | POA: Diagnosis not present

## 2022-07-03 DIAGNOSIS — M549 Dorsalgia, unspecified: Secondary | ICD-10-CM | POA: Diagnosis not present

## 2022-07-03 LAB — LAB REPORT - SCANNED
A1c: 6.1
Creatinine, POC: 106 mg/dL
EGFR: 62
Microalb Creat Ratio: 11.2
Microalbumin, Urine: 1.18

## 2022-07-14 ENCOUNTER — Other Ambulatory Visit: Payer: Self-pay | Admitting: Internal Medicine

## 2022-08-04 ENCOUNTER — Other Ambulatory Visit: Payer: Self-pay | Admitting: Internal Medicine

## 2022-09-09 DIAGNOSIS — Z85828 Personal history of other malignant neoplasm of skin: Secondary | ICD-10-CM | POA: Diagnosis not present

## 2022-09-09 DIAGNOSIS — L814 Other melanin hyperpigmentation: Secondary | ICD-10-CM | POA: Diagnosis not present

## 2022-09-09 DIAGNOSIS — L570 Actinic keratosis: Secondary | ICD-10-CM | POA: Diagnosis not present

## 2022-09-09 DIAGNOSIS — D2362 Other benign neoplasm of skin of left upper limb, including shoulder: Secondary | ICD-10-CM | POA: Diagnosis not present

## 2022-09-09 DIAGNOSIS — D692 Other nonthrombocytopenic purpura: Secondary | ICD-10-CM | POA: Diagnosis not present

## 2022-09-09 DIAGNOSIS — D1801 Hemangioma of skin and subcutaneous tissue: Secondary | ICD-10-CM | POA: Diagnosis not present

## 2022-09-09 DIAGNOSIS — L821 Other seborrheic keratosis: Secondary | ICD-10-CM | POA: Diagnosis not present

## 2022-11-08 DIAGNOSIS — M545 Low back pain, unspecified: Secondary | ICD-10-CM | POA: Diagnosis not present

## 2022-11-20 ENCOUNTER — Ambulatory Visit: Payer: Medicare Other | Attending: Anesthesiology

## 2022-11-20 DIAGNOSIS — M7918 Myalgia, other site: Secondary | ICD-10-CM | POA: Diagnosis not present

## 2022-11-20 DIAGNOSIS — R293 Abnormal posture: Secondary | ICD-10-CM | POA: Insufficient documentation

## 2022-11-20 DIAGNOSIS — M47816 Spondylosis without myelopathy or radiculopathy, lumbar region: Secondary | ICD-10-CM | POA: Diagnosis not present

## 2022-11-20 DIAGNOSIS — M6281 Muscle weakness (generalized): Secondary | ICD-10-CM | POA: Diagnosis not present

## 2022-11-20 DIAGNOSIS — M5459 Other low back pain: Secondary | ICD-10-CM | POA: Diagnosis not present

## 2022-11-20 NOTE — Therapy (Signed)
OUTPATIENT PHYSICAL THERAPY THORACOLUMBAR EVALUATION   Patient Name: Charles Oconnor MRN: [id number] DOB:10-05-1950, 72 y.o., male Today's Date: 11/20/2022  END OF SESSION:  PT End of Session - 11/20/22 1310     Visit Number 1    Date for PT Re-Evaluation 02/12/23    Authorization Type Medicare A and B    PT Start Time 1310    PT Stop Time 1355    PT Time Calculation (min) 45 min    Activity Tolerance Patient tolerated treatment well;Patient limited by pain    Behavior During Therapy WFL for tasks assessed/performed             Past Medical History:  Diagnosis Date   Arthritis    Back knees   Cervical spinal stenosis    Complication of anesthesia    Coronary artery disease    mild to moderate   Dyslipidemia    Dyspnea    ED (erectile dysfunction)    GERD (gastroesophageal reflux disease)    Hypertension    PONV (postoperative nausea and vomiting)    Happened once 30 yrs ago. No problems since.   Prostate cancer [facility])    Past Surgical History:  Procedure Laterality Date   ANTERIOR CERVICAL DECOMPRESSION/DISCECTOMY FUSION 4 LEVELS N/A 09/22/2017   Procedure: ACDF - C3-C4 - C4-C5 - C5-C6 - C6-C7;  Surgeon: Julio Sicks, MD;  Location: [facility] OR;  Service: Neurosurgery;  Laterality: N/A;   APPENDECTOMY  1985   CARDIAC CATHETERIZATION  2002, 2011   mild-mod CAD   CARDIAC CATHETERIZATION N/A 03/12/2015   Procedure: Left Heart Cath and Coronary Angiography;  Surgeon: Chrystie Nose, MD;  Location: [facility] INVASIVE CV LAB;  Service: Cardiovascular;  Laterality: N/A;   COLON RESECTION N/A 02/22/2019   Procedure: LAPAROSCOPIC COLON RESECTION AND DIVERTICULECTOMY;  Surgeon: Sheliah Hatch, De Blanch, MD;  Location: [facility] ORS;  Service: General;  Laterality: N/A;   HAND SURGERY Left    had joints fused   JOINT REPLACEMENT Right 04/11/2009   LAPAROSCOPIC LYSIS OF ADHESIONS N/A 02/22/2019   Procedure: LAPAROSCOPIC LYSIS OF ADHESIONS;  Surgeon: Rodman Pickle, MD;  Location: [facility] ORS;   Service: General;  Laterality: N/A;   PROSTATECTOMY  03/2007   RIGHT/LEFT HEART CATH AND CORONARY ANGIOGRAPHY N/A 09/27/2019   Procedure: RIGHT/LEFT HEART CATH AND CORONARY ANGIOGRAPHY;  Surgeon: Dolores Patty, MD;  Location: [facility] INVASIVE CV LAB;  Service: Cardiovascular;  Laterality: N/A;   TOTAL KNEE ARTHROPLASTY  04/2009   TOTAL KNEE ARTHROPLASTY Left 12/19/2019   Procedure: TOTAL KNEE ARTHROPLASTY;  Surgeon: Ollen Gross, MD;  Location: [facility] ORS;  Service: Orthopedics;  Laterality: Left;  50 mins   Patient Active Problem List   Diagnosis Date Noted   OA (osteoarthritis) of knee 12/19/2019   Primary osteoarthritis of left knee 12/19/2019   Small bowel obstruction (HCC) 02/23/2019   Respiratory distress following surgery 09/24/2017   Cervical spondylosis with radiculopathy 09/22/2017   Viral URI with cough 04/01/2016   Chest pain, unspecified    HTN (hypertension) 01/13/2013   Dyslipidemia 01/13/2013   CAD (coronary artery disease) 01/13/2013    PCP: Lupita Raider  REFERRING PROVIDER: Ceasar Lund  REFERRING DIAG: LBP  Rationale for Evaluation and Treatment: Rehabilitation  THERAPY DIAG:  Other low back pain  ONSET DATE: "2019"  SUBJECTIVE:  SUBJECTIVE STATEMENT: My back is killing, I have been dealing with it for years. I have had the shots and zapped some nerves, that was about 5 years or longer ago.   PERTINENT HISTORY:  Radiographs showed a questionable mild compression deformity of L2 which may be new from 2019. However patient has no history of trauma and he has no midline tenderness on exam and no tenderness at the level of L2. We discussed obtaining an MRI today for better evaluation of his back pain, patient declined at this time he wants to try medications as above and  follow-up with back specialist. If patient changes mind or if pain worsens he will contact this clinic. Patient aware of lifting restrictions.   Additionally patient does report history of prediabetes with H1C6.1. I asked the patient call his PCP to inform them of the low-dose prednisone prescription I provided him today. Patient states understanding. I discussed signs/symptoms that warrant ER evaluation for hyperglycemia. He reports he has been on prednisone in 2023 without issues.   PAIN:  Are you having pain? Yes: NPRS scale: 5/10 Pain location: mid back and to the left  Pain description: a few weeks it was sharp, now it is a dull ache  Aggravating factors: nothing, it is pretty constant Relieving factors: nothing   PRECAUTIONS: None  RED FLAGS: None   WEIGHT BEARING RESTRICTIONS: No  FALLS:  Has patient fallen in last 6 months? No  LIVING ENVIRONMENT: Lives with: lives with their spouse Lives in: House/apartment Stairs: No Has following equipment at home: Environmental consultant - 2 wheeled and Crutches   PLOF: Independent and Independent with basic ADLs  PATIENT GOALS: pain relief    OBJECTIVE:   DIAGNOSTIC FINDINGS:  Radiographs showed a questionable mild compression deformity of L2 which may be new from 2019.    COGNITION: Overall cognitive status: Within functional limits for tasks assessed     SENSATION: Light touch: Impaired  and neuropathy knee down in BLE, can only slightly feel with light touch   MUSCLE LENGTH: Hamstrings: mild tightness  POSTURE: rounded shoulders, decreased lumbar lordosis, increased thoracic kyphosis, and big curve in thoracic spine   PALPATION: TTP lumbar spine L1-S2, thoracic scoliosis   LUMBAR ROM:   AROM eval  Flexion 75% unable to touch toes, painful  Extension 25% pain in low back  Right lateral flexion Past fibular head but pain  Left lateral flexion Past fibular head but pain  Right rotation WFL  Left rotation WFL    (Blank rows =  not tested)  LOWER EXTREMITY ROM:  all WFL    LOWER EXTREMITY MMT:  grossly 5/5    LUMBAR SPECIAL TESTS:  Straight leg raise test: Negative and FABER test: Positive  FUNCTIONAL TESTS:  5 times sit to stand: 14.29s   TODAY'S TREATMENT:  DATE: EVAL 11/20/22    PATIENT EDUCATION:  Education details: POC and HEP Person educated: Patient Education method: Explanation Education comprehension: verbalized understanding  HOME EXERCISE PROGRAM: Access Code: [email or web address] URL: [email or web address] Date: 11/20/2022 Prepared by: Cassie Freer  Exercises - Supine Lower Trunk Rotation  - 1 x daily - 7 x weekly - 2 sets - 10 reps - Supine Bridge  - 1 x daily - 7 x weekly - 2 sets - 10 reps - Supine Single Knee to Chest Stretch  - 1 x daily - 7 x weekly - 1 sets - 2 reps - 15 hold - Standing Shoulder W at Wall  - 1 x daily - 7 x weekly - 2 sets - 10 reps - Doorway Pec Stretch at 90 Degrees Abduction  - 1 x daily - 7 x weekly - 1 sets - 2 reps - 15 hold - Standing Shoulder Horizontal Abduction with Resistance  - 1 x daily - 7 x weekly - 2 sets - 10 reps  ASSESSMENT:  CLINICAL IMPRESSION: Patient is a 72 y.o. male who was seen today for physical therapy evaluation and treatment for LBP. He is tender to palpate along his lumbar spine. Patient has thoracic scoliosis and a significant curvature in his spine. Most of his pain in central low back. His posture is kyphotic and his pelvis is anteriorly tilted. He has pain with most movements and is moving slow today. He has been sleeping in a recliner for the past 5 weeks because his dog had surgery. Patient will benefit from skilled PT to increase his functional mobility and decrease his pain to be able to carry out daily tasks without difficulty.   OBJECTIVE IMPAIRMENTS: decreased ROM, improper body mechanics, and  pain.   REHAB POTENTIAL: Good  CLINICAL DECISION MAKING: Stable/uncomplicated  EVALUATION COMPLEXITY: Low   GOALS: Goals reviewed with patient? Yes  SHORT TERM GOALS: Target date: 01/01/23  Patient will be independent with initial HEP.  Goal status: INITIAL   LONG TERM GOALS: 02/12/23  Patient will be independent with advanced/ongoing HEP to improve outcomes and carryover.  Goal status: INITIAL  2.  Patient will report 75% improvement in low back pain to improve QOL.  Baseline: 5/10 pain Goal status: INITIAL  3.  Patient will demonstrate full pain free lumbar ROM to perform ADLs.   Baseline: see chart above Goal status: INITIAL  4.  Patient to demonstrate ability to achieve and maintain good spinal alignment/posturing and body mechanics needed for daily activities. Baseline: thoracic kyphosis, decrease lumbar lordosis  Goal status: INITIAL  PLAN:  PT FREQUENCY: 1-2x/week  PT DURATION: 12 weeks  PLANNED INTERVENTIONS: Therapeutic exercises, Therapeutic activity, Neuromuscular re-education, Balance training, Gait training, Patient/Family education, Self Care, Joint mobilization, Stair training, Dry Needling, Electrical stimulation, Spinal manipulation, Spinal mobilization, Cryotherapy, Moist heat, Traction, Ultrasound, and Manual therapy.  PLAN FOR NEXT SESSION: low back mobility and stretching   Cassie Freer, PT 11/20/2022, 1:53 PM

## 2022-11-24 ENCOUNTER — Ambulatory Visit: Payer: Medicare Other

## 2022-11-24 ENCOUNTER — Other Ambulatory Visit: Payer: Self-pay

## 2022-11-24 DIAGNOSIS — M5459 Other low back pain: Secondary | ICD-10-CM | POA: Diagnosis not present

## 2022-11-24 DIAGNOSIS — M6281 Muscle weakness (generalized): Secondary | ICD-10-CM | POA: Diagnosis not present

## 2022-11-24 DIAGNOSIS — R293 Abnormal posture: Secondary | ICD-10-CM

## 2022-11-24 NOTE — Therapy (Signed)
OUTPATIENT PHYSICAL THERAPY THORACOLUMBAR EVALUATION   Patient Name: Charles Oconnor MRN: [id number] DOB:November 29, 1950, 72 y.o., male Today's Date: 11/24/2022  END OF SESSION:  PT End of Session - 11/24/22 1340     Visit Number 2    Date for PT Re-Evaluation 02/12/23    Authorization Type Medicare A and B    PT Start Time 1345    PT Stop Time 1430    PT Time Calculation (min) 45 min    Activity Tolerance Patient tolerated treatment well;Patient limited by pain    Behavior During Therapy WFL for tasks assessed/performed              Past Medical History:  Diagnosis Date   Arthritis    Back knees   Cervical spinal stenosis    Complication of anesthesia    Coronary artery disease    mild to moderate   Dyslipidemia    Dyspnea    ED (erectile dysfunction)    GERD (gastroesophageal reflux disease)    Hypertension    PONV (postoperative nausea and vomiting)    Happened once 30 yrs ago. No problems since.   Prostate cancer [facility])    Past Surgical History:  Procedure Laterality Date   ANTERIOR CERVICAL DECOMPRESSION/DISCECTOMY FUSION 4 LEVELS N/A 09/22/2017   Procedure: ACDF - C3-C4 - C4-C5 - C5-C6 - C6-C7;  Surgeon: Julio Sicks, MD;  Location: [facility] OR;  Service: Neurosurgery;  Laterality: N/A;   APPENDECTOMY  1985   CARDIAC CATHETERIZATION  2002, 2011   mild-mod CAD   CARDIAC CATHETERIZATION N/A 03/12/2015   Procedure: Left Heart Cath and Coronary Angiography;  Surgeon: Chrystie Nose, MD;  Location: [facility] INVASIVE CV LAB;  Service: Cardiovascular;  Laterality: N/A;   COLON RESECTION N/A 02/22/2019   Procedure: LAPAROSCOPIC COLON RESECTION AND DIVERTICULECTOMY;  Surgeon: Sheliah Hatch, De Blanch, MD;  Location: [facility] ORS;  Service: General;  Laterality: N/A;   HAND SURGERY Left    had joints fused   JOINT REPLACEMENT Right 04/11/2009   LAPAROSCOPIC LYSIS OF ADHESIONS N/A 02/22/2019   Procedure: LAPAROSCOPIC LYSIS OF ADHESIONS;  Surgeon: Rodman Pickle, MD;  Location: [facility] ORS;   Service: General;  Laterality: N/A;   PROSTATECTOMY  03/2007   RIGHT/LEFT HEART CATH AND CORONARY ANGIOGRAPHY N/A 09/27/2019   Procedure: RIGHT/LEFT HEART CATH AND CORONARY ANGIOGRAPHY;  Surgeon: Dolores Patty, MD;  Location: [facility] INVASIVE CV LAB;  Service: Cardiovascular;  Laterality: N/A;   TOTAL KNEE ARTHROPLASTY  04/2009   TOTAL KNEE ARTHROPLASTY Left 12/19/2019   Procedure: TOTAL KNEE ARTHROPLASTY;  Surgeon: Ollen Gross, MD;  Location: [facility] ORS;  Service: Orthopedics;  Laterality: Left;  50 mins   Patient Active Problem List   Diagnosis Date Noted   OA (osteoarthritis) of knee 12/19/2019   Primary osteoarthritis of left knee 12/19/2019   Small bowel obstruction (HCC) 02/23/2019   Respiratory distress following surgery 09/24/2017   Cervical spondylosis with radiculopathy 09/22/2017   Viral URI with cough 04/01/2016   Chest pain, unspecified    HTN (hypertension) 01/13/2013   Dyslipidemia 01/13/2013   CAD (coronary artery disease) 01/13/2013    PCP: Lupita Raider  REFERRING PROVIDER: Ceasar Lund  REFERRING DIAG: LBP  Rationale for Evaluation and Treatment: Rehabilitation  THERAPY DIAG:  Other low back pain  Abnormal posture  Muscle weakness (generalized)  ONSET DATE: "2019"  SUBJECTIVE:  SUBJECTIVE STATEMENT: My back is  PERTINENT HISTORY:  Radiographs showed a questionable mild compression deformity of L2 which may be new from 2019. However patient has no history of trauma and he has no midline tenderness on exam and no tenderness at the level of L2. We discussed obtaining an MRI today for better evaluation of his back pain, patient declined at this time he wants to try medications as above and follow-up with back specialist. If patient changes mind or if pain worsens he will  contact this clinic. Patient aware of lifting restrictions.   Additionally patient does report history of prediabetes with H1C6.1. I asked the patient call his PCP to inform them of the low-dose prednisone prescription I provided him today. Patient states understanding. I discussed signs/symptoms that warrant ER evaluation for hyperglycemia. He reports he has been on prednisone in 2023 without issues.   PAIN:  Are you having pain? Yes: NPRS scale: 5/10 Pain location: mid back and to the left  Pain description: a few weeks it was sharp, now it is a dull ache  Aggravating factors: nothing, it is pretty constant Relieving factors: nothing   PRECAUTIONS: None  RED FLAGS: None   WEIGHT BEARING RESTRICTIONS: No  FALLS:  Has patient fallen in last 6 months? No  LIVING ENVIRONMENT: Lives with: lives with their spouse Lives in: House/apartment Stairs: No Has following equipment at home: Environmental consultant - 2 wheeled and Crutches   PLOF: Independent and Independent with basic ADLs  PATIENT GOALS: pain relief    OBJECTIVE:   DIAGNOSTIC FINDINGS:  Radiographs showed a questionable mild compression deformity of L2 which may be new from 2019.    COGNITION: Overall cognitive status: Within functional limits for tasks assessed     SENSATION: Light touch: Impaired  and neuropathy knee down in BLE, can only slightly feel with light touch   MUSCLE LENGTH: Hamstrings: mild tightness  POSTURE: rounded shoulders, decreased lumbar lordosis, increased thoracic kyphosis, and big curve in thoracic spine   PALPATION: TTP lumbar spine L1-S2, thoracic scoliosis   LUMBAR ROM:   AROM eval  Flexion 75% unable to touch toes, painful  Extension 25% pain in low back  Right lateral flexion Past fibular head but pain  Left lateral flexion Past fibular head but pain  Right rotation WFL  Left rotation WFL    (Blank rows = not tested)  LOWER EXTREMITY ROM:  all WFL    LOWER EXTREMITY MMT:  grossly  5/5    LUMBAR SPECIAL TESTS:  Straight leg raise test: Negative and FABER test: Positive  FUNCTIONAL TESTS:  5 times sit to stand: 14.29s   TODAY'S TREATMENT:                                                                                                                              DATE: 11/24/22: Electrical stim: IFC for pain management , 4 pads cross current 2 on each side at thoraco lumbar jxn  level., with moist heat   Therex: Nustep 5 min, UE's and LE's x 5 min, level 4 Review/ practice of therex as instructed for home program from initial session:  Supine for LTR,  Bridging Single knee to chest Large blue physioball, seated roll outs with stretch B shoulder flexion and extension, off loading thoracic and lumbar spine Seated hip abd/ER with blue t band resistance to fire multifidi Seated rows 20# 15 x  Seated lat pulls 25# 15x  EVAL 11/20/22    PATIENT EDUCATION:  Education details: POC and HEP Person educated: Patient Education method: Explanation Education comprehension: verbalized understanding  HOME EXERCISE PROGRAM: Access Code: [email or web address] URL: [email or web address] Date: 11/20/2022 Prepared by: Cassie Freer  Exercises - Supine Lower Trunk Rotation  - 1 x daily - 7 x weekly - 2 sets - 10 reps - Supine Bridge  - 1 x daily - 7 x weekly - 2 sets - 10 reps - Supine Single Knee to Chest Stretch  - 1 x daily - 7 x weekly - 1 sets - 2 reps - 15 hold - Standing Shoulder W at Wall  - 1 x daily - 7 x weekly - 2 sets - 10 reps - Doorway Pec Stretch at 90 Degrees Abduction  - 1 x daily - 7 x weekly - 1 sets - 2 reps - 15 hold - Standing Shoulder Horizontal Abduction with Resistance  - 1 x daily - 7 x weekly - 2 sets - 10 reps  ASSESSMENT:  CLINICAL IMPRESSION: Patient is a 72 y.o. male who participated today in his first skilled physical treatment since evaluation for LBP. He is still sleeping in recliner and picking up his 16# dog about 10 to 14 x a day from  kennel due to dog's recent surgery.  Had increased pain today with any rotational movements, such as LTR, on the Nustep and bridging.  Therefore decided to utilize e stim for pain management which he tolerated well. Also inst in therx without rotational movements. He has been sleeping in a recliner for the past 6 weeks because his dog had surgery. Patient will benefit from skilled PT to increase his functional mobility and decrease his pain to be able to carry out daily tasks without difficulty.   OBJECTIVE IMPAIRMENTS: decreased ROM, improper body mechanics, and pain.   REHAB POTENTIAL: Good  CLINICAL DECISION MAKING: Stable/uncomplicated  EVALUATION COMPLEXITY: Low   GOALS: Goals reviewed with patient? Yes  SHORT TERM GOALS: Target date: 01/01/23  Patient will be independent with initial HEP.  Goal status: INITIAL   LONG TERM GOALS: 02/12/23  Patient will be independent with advanced/ongoing HEP to improve outcomes and carryover.  Goal status: INITIAL  2.  Patient will report 75% improvement in low back pain to improve QOL.  Baseline: 5/10 pain Goal status: INITIAL  3.  Patient will demonstrate full pain free lumbar ROM to perform ADLs.   Baseline: see chart above Goal status: INITIAL  4.  Patient to demonstrate ability to achieve and maintain good spinal alignment/posturing and body mechanics needed for daily activities. Baseline: thoracic kyphosis, decrease lumbar lordosis  Goal status: INITIAL  PLAN:  PT FREQUENCY: 1-2x/week  PT DURATION: 12 weeks  PLANNED INTERVENTIONS: Therapeutic exercises, Therapeutic activity, Neuromuscular re-education, Balance training, Gait training, Patient/Family education, Self Care, Joint mobilization, Stair training, Dry Needling, Electrical stimulation, Spinal manipulation, Spinal mobilization, Cryotherapy, Moist heat, Traction, Ultrasound, and Manual therapy.  PLAN FOR NEXT SESSION: low back mobility and stretching   Azzan Butler L Filippa Yarbough,  PT 11/24/2022, 4:15 PM

## 2022-11-26 ENCOUNTER — Ambulatory Visit: Payer: Medicare Other

## 2022-11-26 ENCOUNTER — Other Ambulatory Visit: Payer: Self-pay

## 2022-11-26 DIAGNOSIS — M5459 Other low back pain: Secondary | ICD-10-CM | POA: Diagnosis not present

## 2022-11-26 DIAGNOSIS — M6281 Muscle weakness (generalized): Secondary | ICD-10-CM | POA: Diagnosis not present

## 2022-11-26 DIAGNOSIS — R293 Abnormal posture: Secondary | ICD-10-CM

## 2022-11-26 NOTE — Therapy (Signed)
OUTPATIENT PHYSICAL THERAPY THORACOLUMBAR TREATMENT  Patient Name: Charles Oconnor MRN: [id number] DOB:1950/08/09, 72 y.o., male Today's Date: 11/26/2022  END OF SESSION:  PT End of Session - 11/26/22 1524     Visit Number 3    Date for PT Re-Evaluation 02/12/23    Progress Note Due on Visit 10    PT Start Time 1520    PT Stop Time 1600    PT Time Calculation (min) 40 min    Activity Tolerance Patient limited by pain    Behavior During Therapy [facility] for tasks assessed/performed               Past Medical History:  Diagnosis Date   Arthritis    Back knees   Cervical spinal stenosis    Complication of anesthesia    Coronary artery disease    mild to moderate   Dyslipidemia    Dyspnea    ED (erectile dysfunction)    GERD (gastroesophageal reflux disease)    Hypertension    PONV (postoperative nausea and vomiting)    Happened once 30 yrs ago. No problems since.   Prostate cancer [facility])    Past Surgical History:  Procedure Laterality Date   ANTERIOR CERVICAL DECOMPRESSION/DISCECTOMY FUSION 4 LEVELS N/A 09/22/2017   Procedure: ACDF - C3-C4 - C4-C5 - C5-C6 - C6-C7;  Surgeon: Julio Sicks, MD;  Location: [facility] OR;  Service: Neurosurgery;  Laterality: N/A;   APPENDECTOMY  1985   CARDIAC CATHETERIZATION  2002, 2011   mild-mod CAD   CARDIAC CATHETERIZATION N/A 03/12/2015   Procedure: Left Heart Cath and Coronary Angiography;  Surgeon: Chrystie Nose, MD;  Location: [facility] INVASIVE CV LAB;  Service: Cardiovascular;  Laterality: N/A;   COLON RESECTION N/A 02/22/2019   Procedure: LAPAROSCOPIC COLON RESECTION AND DIVERTICULECTOMY;  Surgeon: Sheliah Hatch, De Blanch, MD;  Location: [facility] ORS;  Service: General;  Laterality: N/A;   HAND SURGERY Left    had joints fused   JOINT REPLACEMENT Right 04/11/2009   LAPAROSCOPIC LYSIS OF ADHESIONS N/A 02/22/2019   Procedure: LAPAROSCOPIC LYSIS OF ADHESIONS;  Surgeon: Rodman Pickle, MD;  Location: [facility] ORS;  Service: General;  Laterality: N/A;    PROSTATECTOMY  03/2007   RIGHT/LEFT HEART CATH AND CORONARY ANGIOGRAPHY N/A 09/27/2019   Procedure: RIGHT/LEFT HEART CATH AND CORONARY ANGIOGRAPHY;  Surgeon: Dolores Patty, MD;  Location: [facility] INVASIVE CV LAB;  Service: Cardiovascular;  Laterality: N/A;   TOTAL KNEE ARTHROPLASTY  04/2009   TOTAL KNEE ARTHROPLASTY Left 12/19/2019   Procedure: TOTAL KNEE ARTHROPLASTY;  Surgeon: Ollen Gross, MD;  Location: [facility] ORS;  Service: Orthopedics;  Laterality: Left;  50 mins   Patient Active Problem List   Diagnosis Date Noted   OA (osteoarthritis) of knee 12/19/2019   Primary osteoarthritis of left knee 12/19/2019   Small bowel obstruction (HCC) 02/23/2019   Respiratory distress following surgery 09/24/2017   Cervical spondylosis with radiculopathy 09/22/2017   Viral URI with cough 04/01/2016   Chest pain, unspecified    HTN (hypertension) 01/13/2013   Dyslipidemia 01/13/2013   CAD (coronary artery disease) 01/13/2013    PCP: Lupita Raider  REFERRING PROVIDER: Ceasar Lund  REFERRING DIAG: LBP  Rationale for Evaluation and Treatment: Rehabilitation  THERAPY DIAG:  Other low back pain  Abnormal posture  Muscle weakness (generalized)  ONSET DATE: "2019"  SUBJECTIVE:  SUBJECTIVE STATEMENT: My back is hurting pretty steadily, doesn't seem to get worse or better.  Does fet wors with some movements  PERTINENT HISTORY:  Radiographs showed a questionable mild compression deformity of L2 which may be new from 2019. However patient has no history of trauma and he has no midline tenderness on exam and no tenderness at the level of L2. We discussed obtaining an MRI today for better evaluation of his back pain, patient declined at this time he wants to try medications as above and follow-up with back  specialist. If patient changes mind or if pain worsens he will contact this clinic. Patient aware of lifting restrictions.   Additionally patient does report history of prediabetes with H1C6.1. I asked the patient call his PCP to inform them of the low-dose prednisone prescription I provided him today. Patient states understanding. I discussed signs/symptoms that warrant ER evaluation for hyperglycemia. He reports he has been on prednisone in 2023 without issues.   PAIN:  Are you having pain? Yes: NPRS scale: 5/10 Pain location: mid back and to the left  Pain description: a few weeks it was sharp, now it is a dull ache  Aggravating factors: nothing, it is pretty constant Relieving factors: nothing   PRECAUTIONS: None  RED FLAGS: None   WEIGHT BEARING RESTRICTIONS: No  FALLS:  Has patient fallen in last 6 months? No  LIVING ENVIRONMENT: Lives with: lives with their spouse Lives in: House/apartment Stairs: No Has following equipment at home: Environmental consultant - 2 wheeled and Crutches   PLOF: Independent and Independent with basic ADLs  PATIENT GOALS: pain relief    OBJECTIVE:   DIAGNOSTIC FINDINGS:  Radiographs showed a questionable mild compression deformity of L2 which may be new from 2019.    COGNITION: Overall cognitive status: Within functional limits for tasks assessed     SENSATION: Light touch: Impaired  and neuropathy knee down in BLE, can only slightly feel with light touch   MUSCLE LENGTH: Hamstrings: mild tightness  POSTURE: rounded shoulders, decreased lumbar lordosis, increased thoracic kyphosis, and big curve in thoracic spine   PALPATION: TTP lumbar spine L1-S2, thoracic scoliosis   LUMBAR ROM:   AROM eval  Flexion 75% unable to touch toes, painful  Extension 25% pain in low back  Right lateral flexion Past fibular head but pain  Left lateral flexion Past fibular head but pain  Right rotation WFL  Left rotation WFL    (Blank rows = not tested)  LOWER  EXTREMITY ROM:  all WFL    LOWER EXTREMITY MMT:  grossly 5/5    LUMBAR SPECIAL TESTS:  Straight leg raise test: Negative and FABER test: Positive  FUNCTIONAL TESTS:  5 times sit to stand: 14.29s   TODAY'S TREATMENT:                                                                                                                              DATE:  11/26/22: Therex:  recumbent bicycle 4.0 mph x 6 min Quadriped on mat for alt hip ext Quadriped for alt shoulder flex Supine pelvic tilts with alt LE marches Supine feet over large blue physioball, bridging Seated large 85cm physioball roll outs for thoracic spine extension stretch Seated rows 20# 2 x 15 Seated lat pulls 25#, 2 x 10  11/24/22: Electrical stim: IFC for pain management , 4 pads cross current 2 on each side at thoraco lumbar jxn level., with moist heat   Therex: Nustep 5 min, UE's and LE's x 5 min, level 4 Review/ practice of therex as instructed for home program from initial session:  Supine for LTR,  Bridging Single knee to chest Large blue physioball, seated roll outs with stretch B shoulder flexion and extension, off loading thoracic and lumbar spine Seated hip abd/ER with blue t band resistance to fire multifidi Seated rows 20# 15 x  Seated lat pulls 25# 15x  EVAL 11/20/22    PATIENT EDUCATION:  Education details: POC and HEP Person educated: Patient Education method: Explanation Education comprehension: verbalized understanding  HOME EXERCISE PROGRAM: Access Code: [email or web address] URL: [email or web address] Date: 11/20/2022 Prepared by: Cassie Freer  Exercises - Supine Lower Trunk Rotation  - 1 x daily - 7 x weekly - 2 sets - 10 reps - Supine Bridge  - 1 x daily - 7 x weekly - 2 sets - 10 reps - Supine Single Knee to Chest Stretch  - 1 x daily - 7 x weekly - 1 sets - 2 reps - 15 hold - Standing Shoulder W at Wall  - 1 x daily - 7 x weekly - 2 sets - 10 reps - Doorway Pec Stretch at 90 Degrees  Abduction  - 1 x daily - 7 x weekly - 1 sets - 2 reps - 15 hold - Standing Shoulder Horizontal Abduction with Resistance  - 1 x daily - 7 x weekly - 2 sets - 10 reps  ASSESSMENT:  CLINICAL IMPRESSION: Patient is a 72 y.o. male who participated today in his second skilled physical treatment since evaluation for LBP. He is still sleeping in recliner and picking up his 16# dog about 10 to 14 x a day from kennel due to dog's recent surgery.  Again avoided rotational movements, instead addressed lumbar stability and therex to elongate thoracic spine.  He has been sleeping in a recliner for the past 6 weeks because his dog had surgery and plans to resume in the bed this weekend. Had increased pain today with any lumbar extension movements. Ice applied post ex . Patient will benefit from skilled PT to increase his functional mobility and decrease his pain to be able to carry out daily tasks without difficulty.   OBJECTIVE IMPAIRMENTS: decreased ROM, improper body mechanics, and pain.   REHAB POTENTIAL: Good  CLINICAL DECISION MAKING: Stable/uncomplicated  EVALUATION COMPLEXITY: Low   GOALS: Goals reviewed with patient? Yes  SHORT TERM GOALS: Target date: 01/01/23  Patient will be independent with initial HEP.  Goal status: INITIAL   LONG TERM GOALS: 02/12/23  Patient will be independent with advanced/ongoing HEP to improve outcomes and carryover.  Goal status: INITIAL  2.  Patient will report 75% improvement in low back pain to improve QOL.  Baseline: 5/10 pain Goal status: INITIAL  3.  Patient will demonstrate full pain free lumbar ROM to perform ADLs.   Baseline: see chart above Goal status: INITIAL  4.  Patient to demonstrate ability to achieve and maintain good spinal alignment/posturing and body  mechanics needed for daily activities. Baseline: thoracic kyphosis, decrease lumbar lordosis  Goal status: INITIAL  PLAN:  PT FREQUENCY: 1-2x/week  PT DURATION: 12 weeks  PLANNED  INTERVENTIONS: Therapeutic exercises, Therapeutic activity, Neuromuscular re-education, Balance training, Gait training, Patient/Family education, Self Care, Joint mobilization, Stair training, Dry Needling, Electrical stimulation, Spinal manipulation, Spinal mobilization, Cryotherapy, Moist heat, Traction, Ultrasound, and Manual therapy.  PLAN FOR NEXT SESSION: low back mobility and stretching   [place] Reliford L Harmonii Karle, PT DPT [facility] 11/26/2022, 4:00 PM

## 2022-11-27 NOTE — Therapy (Incomplete)
OUTPATIENT PHYSICAL THERAPY THORACOLUMBAR TREATMENT  Patient Name: Charles Oconnor MRN: [id number] DOB:[birth year], 72 y.o., male Today's Date: 11/27/2022  END OF SESSION:      Past Medical History:  Diagnosis Date   Arthritis    Back knees   Cervical spinal stenosis    Complication of anesthesia    Coronary artery disease    mild to moderate   Dyslipidemia    Dyspnea    ED (erectile dysfunction)    GERD (gastroesophageal reflux disease)    Hypertension    PONV (postoperative nausea and vomiting)    Happened once 30 yrs ago. No problems since.   Prostate cancer [facility])    Past Surgical History:  Procedure Laterality Date   ANTERIOR CERVICAL DECOMPRESSION/DISCECTOMY FUSION 4 LEVELS N/A 09/22/2017   Procedure: ACDF - C3-C4 - C4-C5 - C5-C6 - C6-C7;  Surgeon: Julio Sicks, MD;  Location: [facility] OR;  Service: Neurosurgery;  Laterality: N/A;   APPENDECTOMY  1985   CARDIAC CATHETERIZATION  2002, 2011   mild-mod CAD   CARDIAC CATHETERIZATION N/A 03/12/2015   Procedure: Left Heart Cath and Coronary Angiography;  Surgeon: Chrystie Nose, MD;  Location: [facility] INVASIVE CV LAB;  Service: Cardiovascular;  Laterality: N/A;   COLON RESECTION N/A 02/22/2019   Procedure: LAPAROSCOPIC COLON RESECTION AND DIVERTICULECTOMY;  Surgeon: Sheliah Hatch, De Blanch, MD;  Location: [facility] ORS;  Service: General;  Laterality: N/A;   HAND SURGERY Left    had joints fused   JOINT REPLACEMENT Right 04/11/2009   LAPAROSCOPIC LYSIS OF ADHESIONS N/A 02/22/2019   Procedure: LAPAROSCOPIC LYSIS OF ADHESIONS;  Surgeon: Rodman Pickle, MD;  Location: [facility] ORS;  Service: General;  Laterality: N/A;   PROSTATECTOMY  03/2007   RIGHT/LEFT HEART CATH AND CORONARY ANGIOGRAPHY N/A 09/27/2019   Procedure: RIGHT/LEFT HEART CATH AND CORONARY ANGIOGRAPHY;  Surgeon: Dolores Patty, MD;  Location: [facility] INVASIVE CV LAB;  Service: Cardiovascular;  Laterality: N/A;   TOTAL KNEE ARTHROPLASTY  04/2009   TOTAL KNEE ARTHROPLASTY Left  12/19/2019   Procedure: TOTAL KNEE ARTHROPLASTY;  Surgeon: Ollen Gross, MD;  Location: [facility] ORS;  Service: Orthopedics;  Laterality: Left;  50 mins   Patient Active Problem List   Diagnosis Date Noted   OA (osteoarthritis) of knee 12/19/2019   Primary osteoarthritis of left knee 12/19/2019   Small bowel obstruction (HCC) 02/23/2019   Respiratory distress following surgery 09/24/2017   Cervical spondylosis with radiculopathy 09/22/2017   Viral URI with cough 04/01/2016   Chest pain, unspecified    HTN (hypertension) 01/13/2013   Dyslipidemia 01/13/2013   CAD (coronary artery disease) 01/13/2013    PCP: Lupita Raider  REFERRING PROVIDER: Ceasar Lund  REFERRING DIAG: LBP  Rationale for Evaluation and Treatment: Rehabilitation  THERAPY DIAG:  No diagnosis found.  ONSET DATE: "2019"  SUBJECTIVE:  SUBJECTIVE STATEMENT: My back is hurting pretty steadily, doesn't seem to get worse or better.  Does fet wors with some movements   PERTINENT HISTORY:  Radiographs showed a questionable mild compression deformity of L2 which may be new from 2019. However patient has no history of trauma and he has no midline tenderness on exam and no tenderness at the level of L2. We discussed obtaining an MRI today for better evaluation of his back pain, patient declined at this time he wants to try medications as above and follow-up with back specialist. If patient changes mind or if pain worsens he will contact this clinic. Patient aware of lifting restrictions.   Additionally patient does report history of prediabetes with H1C6.1. I asked the patient call his PCP to inform them of the low-dose prednisone prescription I provided him today. Patient states understanding. I discussed signs/symptoms that warrant ER  evaluation for hyperglycemia. He reports he has been on prednisone in 2023 without issues.   PAIN:  Are you having pain? Yes: NPRS scale: 5/10 Pain location: mid back and to the left  Pain description: a few weeks it was sharp, now it is a dull ache  Aggravating factors: nothing, it is pretty constant Relieving factors: nothing   PRECAUTIONS: None  RED FLAGS: None   WEIGHT BEARING RESTRICTIONS: No  FALLS:  Has patient fallen in last 6 months? No  LIVING ENVIRONMENT: Lives with: lives with their spouse Lives in: House/apartment Stairs: No Has following equipment at home: Environmental consultant - 2 wheeled and Crutches   PLOF: Independent and Independent with basic ADLs  PATIENT GOALS: pain relief    OBJECTIVE:   DIAGNOSTIC FINDINGS:  Radiographs showed a questionable mild compression deformity of L2 which may be new from 2019.    COGNITION: Overall cognitive status: Within functional limits for tasks assessed     SENSATION: Light touch: Impaired  and neuropathy knee down in BLE, can only slightly feel with light touch   MUSCLE LENGTH: Hamstrings: mild tightness  POSTURE: rounded shoulders, decreased lumbar lordosis, increased thoracic kyphosis, and big curve in thoracic spine   PALPATION: TTP lumbar spine L1-S2, thoracic scoliosis   LUMBAR ROM:   AROM eval  Flexion 75% unable to touch toes, painful  Extension 25% pain in low back  Right lateral flexion Past fibular head but pain  Left lateral flexion Past fibular head but pain  Right rotation WFL  Left rotation WFL    (Blank rows = not tested)  LOWER EXTREMITY ROM:  all WFL    LOWER EXTREMITY MMT:  grossly 5/5    LUMBAR SPECIAL TESTS:  Straight leg raise test: Negative and FABER test: Positive  FUNCTIONAL TESTS:  5 times sit to stand: 14.29s   TODAY'S TREATMENT:                                                                                                                              DATE:   12/01/22  NuStep Shoulder ext 10#  Cable rows 15#  Rows and lats 25#  blackTB ext Feet on pball rotations, knees to chest, small bridges  Leg press   11/26/22: Therex:  recumbent bicycle 4.0 mph x 6 min Quadriped on mat for alt hip ext Quadriped for alt shoulder flex Supine pelvic tilts with alt LE marches Supine feet over large blue physioball, bridging Seated large 85cm physioball roll outs for thoracic spine extension stretch Seated rows 20# 2 x 15 Seated lat pulls 25#, 2 x 10  11/24/22: Electrical stim: IFC for pain management , 4 pads cross current 2 on each side at thoraco lumbar jxn level., with moist heat   Therex: Nustep 5 min, UE's and LE's x 5 min, level 4 Review/ practice of therex as instructed for home program from initial session:  Supine for LTR,  Bridging Single knee to chest Large blue physioball, seated roll outs with stretch B shoulder flexion and extension, off loading thoracic and lumbar spine Seated hip abd/ER with blue t band resistance to fire multifidi Seated rows 20# 15 x  Seated lat pulls 25# 15x  EVAL 11/20/22    PATIENT EDUCATION:  Education details: POC and HEP Person educated: Patient Education method: Explanation Education comprehension: verbalized understanding  HOME EXERCISE PROGRAM: Access Code: [email or web address] URL: [email or web address] Date: 11/20/2022 Prepared by: Cassie Freer  Exercises - Supine Lower Trunk Rotation  - 1 x daily - 7 x weekly - 2 sets - 10 reps - Supine Bridge  - 1 x daily - 7 x weekly - 2 sets - 10 reps - Supine Single Knee to Chest Stretch  - 1 x daily - 7 x weekly - 1 sets - 2 reps - 15 hold - Standing Shoulder W at Wall  - 1 x daily - 7 x weekly - 2 sets - 10 reps - Doorway Pec Stretch at 90 Degrees Abduction  - 1 x daily - 7 x weekly - 1 sets - 2 reps - 15 hold - Standing Shoulder Horizontal Abduction with Resistance  - 1 x daily - 7 x weekly - 2 sets - 10 reps  ASSESSMENT:  CLINICAL  IMPRESSION: Patient is a 72 y.o. male who participated today in his second skilled physical treatment since evaluation for LBP. He is still sleeping in recliner and picking up his 16# dog about 10 to 14 x a day from kennel due to dog's recent surgery.  Again avoided rotational movements, instead addressed lumbar stability and therex to elongate thoracic spine.  He has been sleeping in a recliner for the past 6 weeks because his dog had surgery and plans to resume in the bed this weekend. Had increased pain today with any lumbar extension movements. Ice applied post ex . Patient will benefit from skilled PT to increase his functional mobility and decrease his pain to be able to carry out daily tasks without difficulty.   OBJECTIVE IMPAIRMENTS: decreased ROM, improper body mechanics, and pain.   REHAB POTENTIAL: Good  CLINICAL DECISION MAKING: Stable/uncomplicated  EVALUATION COMPLEXITY: Low   GOALS: Goals reviewed with patient? Yes  SHORT TERM GOALS: Target date: 01/01/23  Patient will be independent with initial HEP.  Goal status: INITIAL   LONG TERM GOALS: 02/12/23  Patient will be independent with advanced/ongoing HEP to improve outcomes and carryover.  Goal status: INITIAL  2.  Patient will report 75% improvement in low back pain to improve QOL.  Baseline: 5/10 pain Goal status: INITIAL  3.  Patient will demonstrate  full pain free lumbar ROM to perform ADLs.   Baseline: see chart above Goal status: INITIAL  4.  Patient to demonstrate ability to achieve and maintain good spinal alignment/posturing and body mechanics needed for daily activities. Baseline: thoracic kyphosis, decrease lumbar lordosis  Goal status: INITIAL  PLAN:  PT FREQUENCY: 1-2x/week  PT DURATION: 12 weeks  PLANNED INTERVENTIONS: Therapeutic exercises, Therapeutic activity, Neuromuscular re-education, Balance training, Gait training, Patient/Family education, Self Care, Joint mobilization, Stair training,  Dry Needling, Electrical stimulation, Spinal manipulation, Spinal mobilization, Cryotherapy, Moist heat, Traction, Ultrasound, and Manual therapy.  PLAN FOR NEXT SESSION: low back mobility and stretching   Cassie Freer, PT DPT [facility] 11/27/2022, 5:33 PM

## 2022-12-01 ENCOUNTER — Ambulatory Visit: Payer: Medicare Other | Admitting: Physical Therapy

## 2022-12-01 ENCOUNTER — Encounter: Payer: Self-pay | Admitting: Physical Therapy

## 2022-12-01 DIAGNOSIS — M5459 Other low back pain: Secondary | ICD-10-CM

## 2022-12-01 DIAGNOSIS — M6281 Muscle weakness (generalized): Secondary | ICD-10-CM | POA: Diagnosis not present

## 2022-12-01 DIAGNOSIS — R293 Abnormal posture: Secondary | ICD-10-CM

## 2022-12-01 NOTE — Therapy (Signed)
OUTPATIENT PHYSICAL THERAPY THORACOLUMBAR TREATMENT  Patient Name: Charles Oconnor MRN: [id number] DOB:Sep 30, 1950, 72 y.o., male Today's Date: 12/01/2022  END OF SESSION:  PT End of Session - 12/01/22 0843     Visit Number 4    Date for PT Re-Evaluation 02/12/23    PT Start Time 0845    PT Stop Time 0930    PT Time Calculation (min) 45 min    Activity Tolerance Patient limited by pain    Behavior During Therapy [facility] for tasks assessed/performed               Past Medical History:  Diagnosis Date   Arthritis    Back knees   Cervical spinal stenosis    Complication of anesthesia    Coronary artery disease    mild to moderate   Dyslipidemia    Dyspnea    ED (erectile dysfunction)    GERD (gastroesophageal reflux disease)    Hypertension    PONV (postoperative nausea and vomiting)    Happened once 30 yrs ago. No problems since.   [facility])    Past Surgical History:  Procedure Laterality Date   ANTERIOR CERVICAL DECOMPRESSION/DISCECTOMY FUSION 4 LEVELS N/A 09/22/2017   Procedure: ACDF - C3-C4 - C4-C5 - C5-C6 - C6-C7;  Surgeon: Julio Sicks, MD;  Location: [facility] OR;  Service: Neurosurgery;  Laterality: N/A;   APPENDECTOMY  1985   CARDIAC CATHETERIZATION  2002, 2011   mild-mod CAD   CARDIAC CATHETERIZATION N/A 03/12/2015   Procedure: Left Heart Cath and Coronary Angiography;  Surgeon: Chrystie Nose, MD;  Location: [facility] INVASIVE CV LAB;  Service: Cardiovascular;  Laterality: N/A;   COLON RESECTION N/A 02/22/2019   Procedure: LAPAROSCOPIC COLON RESECTION AND DIVERTICULECTOMY;  Surgeon: Sheliah Hatch, De Blanch, MD;  Location: [facility] ORS;  Service: General;  Laterality: N/A;   HAND SURGERY Left    had joints fused   JOINT REPLACEMENT Right 04/11/2009   LAPAROSCOPIC LYSIS OF ADHESIONS N/A 02/22/2019   Procedure: LAPAROSCOPIC LYSIS OF ADHESIONS;  Surgeon: Rodman Pickle, MD;  Location: [facility] ORS;  Service: General;  Laterality: N/A;   PROSTATECTOMY  03/2007    RIGHT/LEFT HEART CATH AND CORONARY ANGIOGRAPHY N/A 09/27/2019   Procedure: RIGHT/LEFT HEART CATH AND CORONARY ANGIOGRAPHY;  Surgeon: Dolores Patty, MD;  Location: [facility] INVASIVE CV LAB;  Service: Cardiovascular;  Laterality: N/A;   TOTAL KNEE ARTHROPLASTY  04/2009   TOTAL KNEE ARTHROPLASTY Left 12/19/2019   Procedure: TOTAL KNEE ARTHROPLASTY;  Surgeon: Ollen Gross, MD;  Location: [facility] ORS;  Service: Orthopedics;  Laterality: Left;  50 mins   Patient Active Problem List   Diagnosis Date Noted   OA (osteoarthritis) of knee 12/19/2019   Primary osteoarthritis of left knee 12/19/2019   Small bowel obstruction (HCC) 02/23/2019   Respiratory distress following surgery 09/24/2017   Cervical spondylosis with radiculopathy 09/22/2017   Viral URI with cough 04/01/2016   Chest pain, unspecified    HTN (hypertension) 01/13/2013   Dyslipidemia 01/13/2013   CAD (coronary artery disease) 01/13/2013    PCP: Lupita Raider  REFERRING PROVIDER: Ceasar Lund  REFERRING DIAG: LBP  Rationale for Evaluation and Treatment: Rehabilitation  THERAPY DIAG:  Other low back pain  Abnormal posture  Muscle weakness (generalized)  ONSET DATE: "2019"  SUBJECTIVE:  SUBJECTIVE STATEMENT: "Besides my back ok" PERTINENT HISTORY:  Radiographs showed a questionable mild compression deformity of L2 which may be new from 2019. However patient has no history of trauma and he has no midline tenderness on exam and no tenderness at the level of L2. We discussed obtaining an MRI today for better evaluation of his back pain, patient declined at this time he wants to try medications as above and follow-up with back specialist. If patient changes mind or if pain worsens he will contact this clinic. Patient aware of lifting restrictions.    Additionally patient does report history of prediabetes with H1C6.1. I asked the patient call his PCP to inform them of the low-dose prednisone prescription I provided him today. Patient states understanding. I discussed signs/symptoms that warrant ER evaluation for hyperglycemia. He reports he has been on prednisone in 2023 without issues.   PAIN:  Are you having pain? Yes: NPRS scale: 7/10 Pain location: mid back and to the left  Pain description: a few weeks it was sharp, now it is a dull ache  Aggravating factors: nothing, it is pretty constant Relieving factors: nothing   PRECAUTIONS: None  RED FLAGS: None   WEIGHT BEARING RESTRICTIONS: No  FALLS:  Has patient fallen in last 6 months? No  LIVING ENVIRONMENT: Lives with: lives with their spouse Lives in: House/apartment Stairs: No Has following equipment at home: Environmental consultant - 2 wheeled and Crutches   PLOF: Independent and Independent with basic ADLs  PATIENT GOALS: pain relief    OBJECTIVE:   DIAGNOSTIC FINDINGS:  Radiographs showed a questionable mild compression deformity of L2 which may be new from 2019.    COGNITION: Overall cognitive status: Within functional limits for tasks assessed     SENSATION: Light touch: Impaired  and neuropathy knee down in BLE, can only slightly feel with light touch   MUSCLE LENGTH: Hamstrings: mild tightness  POSTURE: rounded shoulders, decreased lumbar lordosis, increased thoracic kyphosis, and big curve in thoracic spine   PALPATION: TTP lumbar spine L1-S2, thoracic scoliosis   LUMBAR ROM:   AROM eval  Flexion 75% unable to touch toes, painful  Extension 25% pain in low back  Right lateral flexion Past fibular head but pain  Left lateral flexion Past fibular head but pain  Right rotation WFL  Left rotation WFL    (Blank rows = not tested)  LOWER EXTREMITY ROM:  all WFL    LOWER EXTREMITY MMT:  grossly 5/5    LUMBAR SPECIAL TESTS:  Straight leg raise test:  Negative and FABER test: Positive  FUNCTIONAL TESTS:  5 times sit to stand: 14.29s   TODAY'S TREATMENT:                                                                                                                              DATE:  12/01/22 NuStep L 5 x 6 min  Rows and lat pull downs 25lb 2x10 Black t-band trunk ext  2x10 Cable shoulder ext 20lb 2x10 MHP to lumbar spine  Bridges x5  Bridges/K2C/trunk rotation with orange ball x10  LE PROM K2X, lower trunk rotations   11/26/22: Therex:  recumbent bicycle 4.0 mph x 6 min Quadriped on mat for alt hip ext Quadriped for alt shoulder flex Supine pelvic tilts with alt LE marches Supine feet over large blue physioball, bridging Seated large 85cm physioball roll outs for thoracic spine extension stretch Seated rows 20# 2 x 15 Seated lat pulls 25#, 2 x 10  11/24/22: Electrical stim: IFC for pain management , 4 pads cross current 2 on each side at thoraco lumbar jxn level., with moist heat   Therex: Nustep 5 min, UE's and LE's x 5 min, level 4 Review/ practice of therex as instructed for home program from initial session:  Supine for LTR,  Bridging Single knee to chest Large blue physioball, seated roll outs with stretch B shoulder flexion and extension, off loading thoracic and lumbar spine Seated hip abd/ER with blue t band resistance to fire multifidi Seated rows 20# 15 x  Seated lat pulls 25# 15x  EVAL 11/20/22    PATIENT EDUCATION:  Education details: POC and HEP Person educated: Patient Education method: Explanation Education comprehension: verbalized understanding  HOME EXERCISE PROGRAM: Access Code: [email or web address] URL: [email or web address] Date: 11/20/2022 Prepared by: Cassie Freer  Exercises - Supine Lower Trunk Rotation  - 1 x daily - 7 x weekly - 2 sets - 10 reps - Supine Bridge  - 1 x daily - 7 x weekly - 2 sets - 10 reps - Supine Single Knee to Chest Stretch  - 1 x daily - 7 x weekly - 1 sets - 2  reps - 15 hold - Standing Shoulder W at Wall  - 1 x daily - 7 x weekly - 2 sets - 10 reps - Doorway Pec Stretch at 90 Degrees Abduction  - 1 x daily - 7 x weekly - 1 sets - 2 reps - 15 hold - Standing Shoulder Horizontal Abduction with Resistance  - 1 x daily - 7 x weekly - 2 sets - 10 reps  ASSESSMENT:  CLINICAL IMPRESSION: Patient reported feeling a lot of back pain this morning. Stated that sitting, pushing up, and twisting all aggravate it. Tactile and verbal cues were needed during all exercises for correct posture and form while performing exercise, which were demonstrated correctly. Pt educated on effects of curvature in the spine and how that may be causing pain with pressure on nerves. Pt reported laying supine does not cause pain so treatment continued with supine exercises along with a MHP for pain relief. Pt has tightness in his trunk and B LE as shown by PROM. Overall he tolerated treatment well and would benefit from continued PT to better improve posture, ROM, and mobility.   OBJECTIVE IMPAIRMENTS: decreased ROM, improper body mechanics, and pain.   REHAB POTENTIAL: Good  CLINICAL DECISION MAKING: Stable/uncomplicated  EVALUATION COMPLEXITY: Low   GOALS: Goals reviewed with patient? Yes  SHORT TERM GOALS: Target date: 01/01/23  Patient will be independent with initial HEP.  Goal status: INITIAL   LONG TERM GOALS: 02/12/23  Patient will be independent with advanced/ongoing HEP to improve outcomes and carryover.  Goal status: INITIAL  2.  Patient will report 75% improvement in low back pain to improve QOL.  Baseline: 5/10 pain Goal status: INITIAL  3.  Patient will demonstrate full pain free lumbar ROM to perform ADLs.   Baseline: see chart above  Goal status: INITIAL  4.  Patient to demonstrate ability to achieve and maintain good spinal alignment/posturing and body mechanics needed for daily activities. Baseline: thoracic kyphosis, decrease lumbar lordosis  Goal  status: INITIAL  PLAN:  PT FREQUENCY: 1-2x/week  PT DURATION: 12 weeks  PLANNED INTERVENTIONS: Therapeutic exercises, Therapeutic activity, Neuromuscular re-education, Balance training, Gait training, Patient/Family education, Self Care, Joint mobilization, Stair training, Dry Needling, Electrical stimulation, Spinal manipulation, Spinal mobilization, Cryotherapy, Moist heat, Traction, Ultrasound, and Manual therapy.  PLAN FOR NEXT SESSION: low back mobility and stretching   Grayce Sessions, PTA  12/01/2022, 8:44 AM

## 2022-12-03 ENCOUNTER — Ambulatory Visit: Payer: Medicare Other

## 2022-12-03 DIAGNOSIS — R293 Abnormal posture: Secondary | ICD-10-CM

## 2022-12-03 DIAGNOSIS — M6281 Muscle weakness (generalized): Secondary | ICD-10-CM

## 2022-12-03 DIAGNOSIS — M5459 Other low back pain: Secondary | ICD-10-CM

## 2022-12-04 NOTE — Therapy (Signed)
12/03/22: The patient arrived today for his scheduled PT visit but then stated that he did not wish to participate. He states any activity is making his back pain worse.  Stated he could hardly move yesterday.  Offered to provide other modalities to assist with pain management but he deferred.  He is to contact his referring MD for follow up.  Hold PT for 30 days

## 2022-12-05 DIAGNOSIS — M5459 Other low back pain: Secondary | ICD-10-CM | POA: Diagnosis not present

## 2022-12-05 DIAGNOSIS — M5451 Vertebrogenic low back pain: Secondary | ICD-10-CM | POA: Diagnosis not present

## 2022-12-08 ENCOUNTER — Ambulatory Visit: Payer: Medicare Other

## 2022-12-09 ENCOUNTER — Ambulatory Visit: Payer: Medicare Other

## 2022-12-10 ENCOUNTER — Ambulatory Visit: Payer: Medicare Other

## 2022-12-12 DIAGNOSIS — M47896 Other spondylosis, lumbar region: Secondary | ICD-10-CM | POA: Diagnosis not present

## 2022-12-12 DIAGNOSIS — M791 Myalgia, unspecified site: Secondary | ICD-10-CM | POA: Diagnosis not present

## 2022-12-12 DIAGNOSIS — M545 Low back pain, unspecified: Secondary | ICD-10-CM | POA: Diagnosis not present

## 2022-12-23 DIAGNOSIS — M47816 Spondylosis without myelopathy or radiculopathy, lumbar region: Secondary | ICD-10-CM | POA: Diagnosis not present

## 2022-12-25 DIAGNOSIS — I2510 Atherosclerotic heart disease of native coronary artery without angina pectoris: Secondary | ICD-10-CM | POA: Diagnosis not present

## 2022-12-25 DIAGNOSIS — E782 Mixed hyperlipidemia: Secondary | ICD-10-CM | POA: Diagnosis not present

## 2022-12-25 DIAGNOSIS — Z8546 Personal history of malignant neoplasm of prostate: Secondary | ICD-10-CM | POA: Diagnosis not present

## 2022-12-25 DIAGNOSIS — I119 Hypertensive heart disease without heart failure: Secondary | ICD-10-CM | POA: Diagnosis not present

## 2022-12-25 DIAGNOSIS — R7303 Prediabetes: Secondary | ICD-10-CM | POA: Diagnosis not present

## 2023-01-07 DIAGNOSIS — M47816 Spondylosis without myelopathy or radiculopathy, lumbar region: Secondary | ICD-10-CM | POA: Diagnosis not present

## 2023-01-09 DIAGNOSIS — N179 Acute kidney failure, unspecified: Secondary | ICD-10-CM | POA: Diagnosis not present

## 2023-01-27 DIAGNOSIS — M47817 Spondylosis without myelopathy or radiculopathy, lumbosacral region: Secondary | ICD-10-CM | POA: Diagnosis not present

## 2023-02-10 DIAGNOSIS — Z23 Encounter for immunization: Secondary | ICD-10-CM | POA: Diagnosis not present

## 2023-03-12 ENCOUNTER — Other Ambulatory Visit: Payer: Self-pay | Admitting: Medical Genetics

## 2023-03-12 DIAGNOSIS — Z006 Encounter for examination for normal comparison and control in clinical research program: Secondary | ICD-10-CM

## 2023-03-21 ENCOUNTER — Other Ambulatory Visit: Payer: Self-pay | Admitting: Internal Medicine

## 2023-04-15 DIAGNOSIS — M79645 Pain in left finger(s): Secondary | ICD-10-CM | POA: Diagnosis not present

## 2023-04-15 DIAGNOSIS — Z4789 Encounter for other orthopedic aftercare: Secondary | ICD-10-CM | POA: Diagnosis not present

## 2023-04-15 DIAGNOSIS — M13842 Other specified arthritis, left hand: Secondary | ICD-10-CM | POA: Diagnosis not present

## 2023-05-23 ENCOUNTER — Other Ambulatory Visit: Payer: Self-pay | Admitting: Internal Medicine

## 2023-06-06 ENCOUNTER — Other Ambulatory Visit: Payer: Self-pay | Admitting: Internal Medicine

## 2023-06-12 ENCOUNTER — Encounter: Payer: Self-pay | Admitting: Internal Medicine

## 2023-06-12 ENCOUNTER — Ambulatory Visit: Payer: Medicare Other | Attending: Internal Medicine | Admitting: Internal Medicine

## 2023-06-12 VITALS — BP 102/60 | HR 55 | Ht 73.0 in | Wt 194.0 lb

## 2023-06-12 DIAGNOSIS — E785 Hyperlipidemia, unspecified: Secondary | ICD-10-CM | POA: Diagnosis not present

## 2023-06-12 DIAGNOSIS — I2510 Atherosclerotic heart disease of native coronary artery without angina pectoris: Secondary | ICD-10-CM | POA: Diagnosis not present

## 2023-06-12 DIAGNOSIS — I10 Essential (primary) hypertension: Secondary | ICD-10-CM | POA: Diagnosis not present

## 2023-06-12 DIAGNOSIS — R5383 Other fatigue: Secondary | ICD-10-CM | POA: Diagnosis not present

## 2023-06-12 DIAGNOSIS — R031 Nonspecific low blood-pressure reading: Secondary | ICD-10-CM | POA: Diagnosis not present

## 2023-06-12 MED ORDER — AMLODIPINE BESYLATE 2.5 MG PO TABS: 2.5000 mg | ORAL_TABLET | Freq: Every day | ORAL | 3 refills | Status: AC

## 2023-06-12 NOTE — Progress Notes (Signed)
OFFICE NOTE  Chief Complaint:  Follow-up  Primary Care Physician: Lupita Raider, MD  HPI:  Charles Oconnor  is a 73 year old gentleman with history of hypertension, dyslipidemia and mild to moderate coronary disease. We have been following him for dyslipidemia and he has had liver enzyme abnormalities before on Zocor and was changed to Pravachol with pretty good control. Sometimes he gets chest discomfort; however, it is pretty short lived and possibly related to that. His EKG today shows a sinus bradycardia at 54 and I understand recently he was placed on a beta blocker which caused marked sinus bradycardia and that was promptly discontinued. At this point, although there is possibly an occasion for that given his history of coronary disease, his heart rate being low, he is unable to tolerate a beta blocker. He is on an ACE inhibitor which is pretty good at controlling his blood pressure as well as amlodipine.   Charles Oconnor returns today for followup. He reports doing fairly well. He's recently started doing some walking and has actually lost about 10 pounds in the last 6 months. He says he does feel better and have more energy. A portion is developed some peripheral neuropathy. He's been started on Neurontin by a neurologist in [place]. He seems to be tolerating this medicine although has some occasional lightheadedness and dizziness. He is peripheral nerve pain is better however does have numbness in his feet. He occasionally gets some mild swelling. He also was told that he had high vitamin B6 levels and was told to stop taking his multivitamin.  I the pleasure see Charles Oconnor back in the office today. Overall he is doing fairly well except he is struggling with low back pain and some neuropathic pain in his legs. He's been getting back injections without much improvement. He's also been describing some occasional chest discomfort and progressive fatigue over the past several months. He  feels like he has no energy. There is also complaints of mid back pain between his shoulder blades which occurs on unusual occasions and not associated with his low back pain. Sometimes it's a little worse when exerting himself although he's not been as active due to his back problems. As a reminder he had heart catheterization in 2011 with Dr. Karsten Fells, which demonstrated mild to moderate coronary artery disease.  He recently had lab work through his primary care provider which looks fairly normal except for an elevated cholesterol profile. His total cholesterol is 166, HL 43, triglycerides 112 and LDL 101. He's currently on pravastatin 80 mg daily and says that he has had problems with myalgias and liver function abnormalities on simvastatin in the past.  Charles Oconnor returns today for follow-up. He recently underwent a stress test which showed the following:  The left ventricular ejection fraction is mildly decreased (45-54%). Nuclear stress EF: 52%. No T wave inversion was noted during stress. There was no ST segment deviation noted during stress. Defect 1: There is a medium defect of moderate severity.   There is a moderate-sized and intensity, partially reversible inferoseptal perfusion defect which could represent ischemia. LVEF 52% with basal inferoseptal dyskinesis. This is an intermediate risk study.     Based on these findings, I recommended either continued medical therapy or we could consider cardiac catheterization. Subsequently he felt like his chest pain was worsening and ultimately based on his prior catheterization results, we decided to proceed with cardiac catheterization. The results are as follows:  Prox RCA lesion, 40% stenosed. The lesion  was not previously treated. Prox Cx to Dist Cx lesion, 20% stenosed. The lesion was not previously treated. Ost 1st Diag to 1st Diag lesion, 40% stenosed. The lesion was not previously treated. Dist LAD lesion, 20% stenosed. The lesion was not  previously treated. The left ventricular systolic function is normal.   Mild, non-obstructive CAD - somewhat improved from prior cath in 2011. Normal LV function. False positive nuclear stress test. At this point, Charles Oconnor is doing much better and denies any chest pain or worsening shortness of breath. He gets a small amount of ankle edema which I attribute to amlodipine.  04/01/2016  Charles Oconnor returns today for follow-up. He denies any chest pain or worsening shortness of breath. He apparently had a sore throat yesterday and has some drainage today. He is interested in some over-the-counter medications for that. He's had no chest pain that sounds anginal. Blood pressure well controlled. Recent cholesterol check through his primary care provider shows good control with LDL less than 100.  03/13/2017  Charles Oconnor returns today for follow-up.  Overall he is doing exceedingly well.  Blood pressure is well-controlled today 126/70.  His body mass index is near normal.  Cholesterol is due to be assessed but has been well controlled.  EKG shows sinus bradycardia at 52 without ischemic changes.  He denies any chest pain or worsening shortness of breath.  03/19/2018  Charles Oconnor is seen today in follow-up.  He recently underwent cervical surgery for pain.  He had a fusion of the lower cervical vertebrae by Dr. Dutch Quint and is doing much better.  He did have postoperative swelling and had steroids because of difficulty breathing.  Lipids a year ago were well controlled.  He has an upcoming appoint with Dr. Sherryll Burger who will check his cholesterol in about 2 months.  I asked him to send [place] those records.  He denies any chest pain or shortness of breath.  Blood pressure is well controlled.  He did recently retire.  03/23/2019  Charles Oconnor is seen today for follow-up.  He reports some progressively worsening shortness of breath with exertion.  He said this has been going on for several months however recently had  an intestinal obstruction.  He said he was up in [place] and started having symptoms and then came here and went to [place] [facility].  A CT scan indicated small bowel obstruction.  He has a history of prior surgeries including a robotic prostatectomy and appendectomy and therefore had surgical adhesions.  He is improving from that but does report that he has had some worsening symptoms.  He is concerned about coronary disease.  I performed his last heart catheterization in 2016 which showed some mild to moderate nonobstructive coronary disease.  His arteries actually looked somewhat better than it had previously.  His most recent lipid profile showed total cholesterol 153, triglycerides 179, HDL 39 and LDL 78.  05/10/2019  Charles Oconnor returns today for follow-up of his CT coronary angiogram.  He reports persistent shortness of breath with exertion.  His CT coronary angiogram was markedly abnormal demonstrating a high coronary calcium score 1119 which was 88th percentile for age and sex matched control.  There is heavy calcification of the right coronary and possible severe stenosis.  The mid LAD was also calcified and it was felt he had at least CAD RADS 3 disease.  CT FFR was sent and however did not demonstrate any significant stenosis.  Although it is possible for a false  negative FFR, it is fairly unlikely.  That being said he has extensive coronary disease and will need additional aggressive risk factor modification.  He is currently on pravastatin 80 mg and LDL remains above target at 78.   08/10/2019  Charles Oconnor is seen today in follow-up.  He still endorses shortness of breath.  He also has some anxiety.  This seems to be worsening he thinks.  He had recent pulmonary function testing which does show some mild reduction in FEV1 and FVC with some improvement after bronchodilator.  He has follow-up with Dr. Isaiah Serge in pulmonary later this month.  I wonder however if his progressive dyspnea is his  multivessel disease.  It could very well be is ischemic equivalent.  After switching him to rosuvastatin, he has had marked improvement in his lipids.  Total cholesterol now 125, triglycerides 137, HDL 40 and LDL 61.  Unfortunately, he reports intolerance of the statin causing what he feels is myalgias and some joint pain.  09/22/2019  Charles Oconnor returns today for follow-up.  He reports possibly some improvement in his dyspnea with Imdur.  He underwent a CT scan high resolution by Dr. Isaiah Serge, this demonstrated no evidence of any interstitial lung disease.  It is thought probably from a pulmonary standpoint that it is not explaining his shortness of breath.  As mentioned above he underwent cardiac catheterization by myself in 2016 which showed mild to moderate nonobstructive coronary disease.  That being said he had a lot of coronary calcium recently by CT coronary angiography, but FFR was negative.  I am concerned that this may be a false negative and that his symptoms represent more significant coronary disease.  01/26/2020  Charles Oconnor is seen today in follow-up.  He will underwent cardiopulmonary exercise testing this July for exercise-induced dyspnea.  Despite this he did an excellent exercise effort with normal functional capacity and no indication of cardiopulmonary limitation.  There was some restrictive lung physiology and he is noted to have an elevated right hemidiaphragm.  Other than that it was thought that maybe there was some contribution of body habitus and exercise intolerance and may be some hyperventilation at peak exercise.  Despite the fact that he had significant coronary calcification there was no significant obstruction and therefore we will continue to manage this medically but cannot find a clear reason as to why he has the shortness of breath other than possibly small vessel disease.  03/27/2021  Charles Oconnor is seen today in follow-up.  Overall he says he is doing well.  He is  been walking with his wife about 2-2 and half miles every day for the past several months.  He thinks this may have made an impact on his cholesterol and A1c and would like those rechecked today.  He denies any chest pain or worsening shortness of breath.  Blood pressure is in the low normal range.  Could possibly be due to more activity and weight loss.  I advised him to monitor for symptoms with that as we might consider decreasing his medications.  05/29/2022  Charles Oconnor returns today for follow-up.  He denies any chest pain or worsening shortness of breath.  Over the summer he had had episodes with palpitations.  He wore a monitor which showed some PSVT as well as PVCs.  We discussed the possibility of adding antiarrhythmic therapy however he said that that his symptoms improved and he did not want to do it at that time.  Overall since then  it is improved.  Heart rate still remains low in the 40s and as low as 30s sometimes.  He is not on any rate lowering medications and denies any symptoms with that.  Blood pressure is also running low.  He has made major changes in his diet.  06/12/2023  Charles Oconnor returns today for follow-up.  He seems to be doing pretty well.  He does get some fatigue.  I wonder if this may related to his blood pressure.  At his last visit I decreased his amlodipine because of hypotension.  Today's blood pressures in the low 100s.  His weight is stayed on the normal range.  Heart rates in the 50s but he notes when he exercises it does go up over 100.  He did wear a monitor for this last year.  Other than that he denies any palpitations or arrhythmias.  He has no chest pain.  Konrad Dolores all was tested in February last year with total 113, HDL 41, triglycerides 101 and LDL 53.  He has a follow-up with his primary care provider again in February.  PMHx:  Past Medical History:  Diagnosis Date   Arthritis    Back knees   Cervical spinal stenosis    Complication of anesthesia     Coronary artery disease    mild to moderate   Dyslipidemia    Dyspnea    ED (erectile dysfunction)    GERD (gastroesophageal reflux disease)    Hypertension    PONV (postoperative nausea and vomiting)    Happened once 30 yrs ago. No problems since.   Prostate cancer [facility])     Past Surgical History:  Procedure Laterality Date   ANTERIOR CERVICAL DECOMPRESSION/DISCECTOMY FUSION 4 LEVELS N/A 09/22/2017   Procedure: ACDF - C3-C4 - C4-C5 - C5-C6 - C6-C7;  Surgeon: Julio Sicks, MD;  Location: [facility] OR;  Service: Neurosurgery;  Laterality: N/A;   APPENDECTOMY  1985   CARDIAC CATHETERIZATION  2002, 2011   mild-mod CAD   CARDIAC CATHETERIZATION N/A 03/12/2015   Procedure: Left Heart Cath and Coronary Angiography;  Surgeon: Chrystie Nose, MD;  Location: [facility] INVASIVE CV LAB;  Service: Cardiovascular;  Laterality: N/A;   COLON RESECTION N/A 02/22/2019   Procedure: LAPAROSCOPIC COLON RESECTION AND DIVERTICULECTOMY;  Surgeon: Sheliah Hatch, De Blanch, MD;  Location: [facility] ORS;  Service: General;  Laterality: N/A;   HAND SURGERY Left    had joints fused   JOINT REPLACEMENT Right 04/11/2009   LAPAROSCOPIC LYSIS OF ADHESIONS N/A 02/22/2019   Procedure: LAPAROSCOPIC LYSIS OF ADHESIONS;  Surgeon: Rodman Pickle, MD;  Location: [facility] ORS;  Service: General;  Laterality: N/A;   PROSTATECTOMY  03/2007   RIGHT/LEFT HEART CATH AND CORONARY ANGIOGRAPHY N/A 09/27/2019   Procedure: RIGHT/LEFT HEART CATH AND CORONARY ANGIOGRAPHY;  Surgeon: Dolores Patty, MD;  Location: [facility] INVASIVE CV LAB;  Service: Cardiovascular;  Laterality: N/A;   TOTAL KNEE ARTHROPLASTY  04/2009   TOTAL KNEE ARTHROPLASTY Left 12/19/2019   Procedure: TOTAL KNEE ARTHROPLASTY;  Surgeon: Ollen Gross, MD;  Location: [facility] ORS;  Service: Orthopedics;  Laterality: Left;  50 mins    FAMHx:  Family History  Problem Relation Age of Onset   Heart attack Father    Diabetes Mother    Heart failure Mother    Coronary artery disease Mother     Stroke Mother    Prostate cancer Brother    Heart attack Brother        also CVA   Heart attack Brother  also CVA   Cancer Brother     SOCHx:   reports that he has never smoked. He has never used smokeless tobacco. He reports that he does not drink alcohol and does not use drugs.  ALLERGIES:  Allergies  Allergen Reactions   Zocor [Simvastatin] Nausea Only and Other (See Comments)    Elevate liver enzymes    ROS: Pertinent items noted in HPI and remainder of comprehensive ROS otherwise negative.  HOME MEDS: Current Outpatient Medications  Medication Sig Dispense Refill   amLODipine (NORVASC) 5 MG tablet Take 1 tablet (5 mg total) by mouth daily. 90 tablet 0   gabapentin (NEURONTIN) 300 MG capsule Take 300 mg by mouth 2 (two) times daily.     isosorbide mononitrate (IMDUR) 30 MG 24 hr tablet TAKE 1 TABLET EVERY DAY 90 tablet 3   pantoprazole (PROTONIX) 40 MG tablet Take 40 mg by mouth daily.      rosuvastatin (CRESTOR) 40 MG tablet TAKE 1 TABLET EVERY DAY (PLEASE KEEP APPOINTMENT WITH CARDIOLOGIST) 60 tablet 9   lisinopril-hydrochlorothiazide (ZESTORETIC) 20-12.5 MG tablet Take 2 tablets by mouth daily. 180 tablet 0   No current facility-administered medications for this visit.    LABS/IMAGING: No results found for this or any previous visit (from the past 48 hours). No results found.  VITALS: BP 102/60   Pulse (!) 55   Ht 6\' 1"  (1.854 m)   Wt 194 lb (88 kg)   SpO2 93%   BMI 25.60 kg/m   EXAM: General appearance: alert and no distress Neck: no carotid bruit, no JVD and thyroid not enlarged, symmetric, no tenderness/mass/nodules Lungs: clear to auscultation bilaterally Heart: regular rate and rhythm, S1, S2 normal, no murmur, click, rub or gallop Abdomen: soft, non-tender; bowel sounds normal; no masses,  no organomegaly Extremities: extremities normal, atraumatic, no cyanosis or edema Pulses: 2+ and symmetric Skin: Skin color, texture, turgor normal. No  rashes or lesions Neurologic: Grossly normal Psych: Pleasant  EKG: EKG Interpretation Date/Time:  Friday June 12 2023 10:22:49 EST Ventricular Rate:  47 PR Interval:  186 QRS Duration:  98 QT Interval:  456 QTC Calculation: 403 R Axis:   5  Text Interpretation: Sinus bradycardia Septal infarct (cited on or before 12-Jun-2023) ST & T wave abnormality, consider anterior ischemia When compared with ECG of 27-Sep-2019 06:00, Questionable change in initial forces of Anteroseptal leads T wave inversion now evident in Anterior leads Confirmed by Zoila Shutter [phone]) on 06/12/2023 10:39:34 AM    ASSESSMENT: History of dyspnea on exertion/chest pressure -high CAC score of 1119, moderate to severe nonobstructive coronary disease and negative by FFR (04/2019) Mild nonobstructive coronary disease by cath (2016), somewhat improved compared to his prior study in 2011 -left and right heart catheterization in May 2021 showed mild nonobstructive CAD unchanged from 2016, normal LVEF and possible diastolic dysfunction.  Cardiopulmonary exercise testing demonstrated essentially normal findings. Mild to moderate abnormalities in pulmonary function Hypertension-controlled Dyslipidemia - controlled Prediabetes-A1c 6.2%  PLAN: 1.   Charles Oconnor denies chest pain or worsening shortness of breath.  Blood pressure has actually been on the low side.  I had decreased at last year because of hypotension.  He still has some fatigue.  I would recommend decreasing his amlodipine further to 2.5 mg daily today.  Otherwise seems to be doing well.  Have continued to encourage exercise.  He has a follow-up with his primary care provider next month at which time his blood pressure hopefully could be reassessed.  He  has no new concerning anginal symptoms at this time.  Follow-up with me annually or sooner as necessary.  Chrystie Nose, MD, [facility], FACP  [place]  [facility]  Medical Director of the Advanced  Lipid Disorders &  Cardiovascular Risk Reduction Clinic Diplomate of the American Board of Clinical Lipidology Attending Cardiologist  Direct Dial: [phone]  Fax: [phone]  Website:  [email or web address]   Lisette Abu Niyana Chesbro 06/12/2023, 10:39 AM

## 2023-06-12 NOTE — Patient Instructions (Signed)
Medication Instructions:  Decrease Amlodipine 2.5 mg daily  *If you need a refill on your cardiac medications before your next appointment, please call your pharmacy*   Lab Work: NONE ordered at this time of appointment   Testing/Procedures: NONE ordered at this time of appointment   Follow-Up: At [facility], you and your health needs are our priority.  As part of our continuing mission to provide you with exceptional heart care, we have created designated Provider Care Teams.  These Care Teams include your primary Cardiologist (physician) and Advanced Practice Providers (APPs -  Physician Assistants and Nurse Practitioners) who all work together to provide you with the care you need, when you need it.  We recommend signing up for the patient portal called "MyChart".  Sign up information is provided on this After Visit Summary.  MyChart is used to connect with patients for Virtual Visits (Telemedicine).  Patients are able to view lab/test results, encounter notes, upcoming appointments, etc.  Non-urgent messages can be sent to your provider as well.   To learn more about what you can do with MyChart, go to [email or web address].    Your next appointment:   1 year(s)  Provider:   Chrystie Nose, MD

## 2023-07-06 DIAGNOSIS — Z8546 Personal history of malignant neoplasm of prostate: Secondary | ICD-10-CM | POA: Diagnosis not present

## 2023-07-06 DIAGNOSIS — I2510 Atherosclerotic heart disease of native coronary artery without angina pectoris: Secondary | ICD-10-CM | POA: Diagnosis not present

## 2023-07-06 DIAGNOSIS — Z0000 Encounter for general adult medical examination without abnormal findings: Secondary | ICD-10-CM | POA: Diagnosis not present

## 2023-07-06 DIAGNOSIS — I119 Hypertensive heart disease without heart failure: Secondary | ICD-10-CM | POA: Diagnosis not present

## 2023-07-06 DIAGNOSIS — M549 Dorsalgia, unspecified: Secondary | ICD-10-CM | POA: Diagnosis not present

## 2023-07-06 DIAGNOSIS — S92504A Nondisplaced unspecified fracture of right lesser toe(s), initial encounter for closed fracture: Secondary | ICD-10-CM | POA: Diagnosis not present

## 2023-07-06 DIAGNOSIS — G4700 Insomnia, unspecified: Secondary | ICD-10-CM | POA: Diagnosis not present

## 2023-07-06 DIAGNOSIS — M79671 Pain in right foot: Secondary | ICD-10-CM | POA: Diagnosis not present

## 2023-07-06 DIAGNOSIS — E782 Mixed hyperlipidemia: Secondary | ICD-10-CM | POA: Diagnosis not present

## 2023-07-06 DIAGNOSIS — Z1211 Encounter for screening for malignant neoplasm of colon: Secondary | ICD-10-CM | POA: Diagnosis not present

## 2023-07-06 DIAGNOSIS — J986 Disorders of diaphragm: Secondary | ICD-10-CM | POA: Diagnosis not present

## 2023-07-06 DIAGNOSIS — R7303 Prediabetes: Secondary | ICD-10-CM | POA: Diagnosis not present

## 2023-07-06 LAB — LAB REPORT - SCANNED
A1c: 6.1
Creatinine, POC: 69 mg/dL
EGFR: 62
Microalb Creat Ratio: 12.8
Microalbumin, Urine: 0.88

## 2023-07-15 ENCOUNTER — Other Ambulatory Visit: Payer: Self-pay | Admitting: Internal Medicine

## 2023-07-21 DIAGNOSIS — I119 Hypertensive heart disease without heart failure: Secondary | ICD-10-CM | POA: Diagnosis not present

## 2023-07-21 DIAGNOSIS — M79671 Pain in right foot: Secondary | ICD-10-CM | POA: Diagnosis not present

## 2023-07-21 DIAGNOSIS — I2510 Atherosclerotic heart disease of native coronary artery without angina pectoris: Secondary | ICD-10-CM | POA: Diagnosis not present

## 2023-07-28 DIAGNOSIS — K5730 Diverticulosis of large intestine without perforation or abscess without bleeding: Secondary | ICD-10-CM | POA: Diagnosis not present

## 2023-07-28 DIAGNOSIS — K219 Gastro-esophageal reflux disease without esophagitis: Secondary | ICD-10-CM | POA: Diagnosis not present

## 2023-07-28 DIAGNOSIS — Z1211 Encounter for screening for malignant neoplasm of colon: Secondary | ICD-10-CM | POA: Diagnosis not present

## 2023-07-28 DIAGNOSIS — R1031 Right lower quadrant pain: Secondary | ICD-10-CM | POA: Diagnosis not present

## 2023-08-10 DIAGNOSIS — I119 Hypertensive heart disease without heart failure: Secondary | ICD-10-CM | POA: Diagnosis not present

## 2023-08-10 DIAGNOSIS — I2510 Atherosclerotic heart disease of native coronary artery without angina pectoris: Secondary | ICD-10-CM | POA: Diagnosis not present

## 2023-08-10 DIAGNOSIS — E782 Mixed hyperlipidemia: Secondary | ICD-10-CM | POA: Diagnosis not present

## 2023-08-10 DIAGNOSIS — Z8546 Personal history of malignant neoplasm of prostate: Secondary | ICD-10-CM | POA: Diagnosis not present

## 2023-08-19 DIAGNOSIS — I119 Hypertensive heart disease without heart failure: Secondary | ICD-10-CM | POA: Diagnosis not present

## 2023-08-19 DIAGNOSIS — I2510 Atherosclerotic heart disease of native coronary artery without angina pectoris: Secondary | ICD-10-CM | POA: Diagnosis not present

## 2023-09-09 DIAGNOSIS — I119 Hypertensive heart disease without heart failure: Secondary | ICD-10-CM | POA: Diagnosis not present

## 2023-09-09 DIAGNOSIS — Z8546 Personal history of malignant neoplasm of prostate: Secondary | ICD-10-CM | POA: Diagnosis not present

## 2023-09-09 DIAGNOSIS — E782 Mixed hyperlipidemia: Secondary | ICD-10-CM | POA: Diagnosis not present

## 2023-09-09 DIAGNOSIS — I2510 Atherosclerotic heart disease of native coronary artery without angina pectoris: Secondary | ICD-10-CM | POA: Diagnosis not present

## 2023-09-10 DIAGNOSIS — L820 Inflamed seborrheic keratosis: Secondary | ICD-10-CM | POA: Diagnosis not present

## 2023-09-10 DIAGNOSIS — L814 Other melanin hyperpigmentation: Secondary | ICD-10-CM | POA: Diagnosis not present

## 2023-09-10 DIAGNOSIS — Z85828 Personal history of other malignant neoplasm of skin: Secondary | ICD-10-CM | POA: Diagnosis not present

## 2023-09-10 DIAGNOSIS — L84 Corns and callosities: Secondary | ICD-10-CM | POA: Diagnosis not present

## 2023-09-10 DIAGNOSIS — D1801 Hemangioma of skin and subcutaneous tissue: Secondary | ICD-10-CM | POA: Diagnosis not present

## 2023-09-10 DIAGNOSIS — L570 Actinic keratosis: Secondary | ICD-10-CM | POA: Diagnosis not present

## 2023-09-10 DIAGNOSIS — L821 Other seborrheic keratosis: Secondary | ICD-10-CM | POA: Diagnosis not present

## 2023-09-18 DIAGNOSIS — I2510 Atherosclerotic heart disease of native coronary artery without angina pectoris: Secondary | ICD-10-CM | POA: Diagnosis not present

## 2023-09-18 DIAGNOSIS — I119 Hypertensive heart disease without heart failure: Secondary | ICD-10-CM | POA: Diagnosis not present

## 2023-09-23 DIAGNOSIS — I2510 Atherosclerotic heart disease of native coronary artery without angina pectoris: Secondary | ICD-10-CM | POA: Diagnosis not present

## 2023-09-23 DIAGNOSIS — S39012A Strain of muscle, fascia and tendon of lower back, initial encounter: Secondary | ICD-10-CM | POA: Diagnosis not present

## 2023-09-23 DIAGNOSIS — R110 Nausea: Secondary | ICD-10-CM | POA: Diagnosis not present

## 2023-09-23 DIAGNOSIS — C61 Malignant neoplasm of prostate: Secondary | ICD-10-CM | POA: Diagnosis not present

## 2023-09-23 DIAGNOSIS — H9312 Tinnitus, left ear: Secondary | ICD-10-CM | POA: Diagnosis not present

## 2023-10-10 DIAGNOSIS — I2510 Atherosclerotic heart disease of native coronary artery without angina pectoris: Secondary | ICD-10-CM | POA: Diagnosis not present

## 2023-10-10 DIAGNOSIS — I119 Hypertensive heart disease without heart failure: Secondary | ICD-10-CM | POA: Diagnosis not present

## 2023-10-10 DIAGNOSIS — Z8546 Personal history of malignant neoplasm of prostate: Secondary | ICD-10-CM | POA: Diagnosis not present

## 2023-10-10 DIAGNOSIS — E782 Mixed hyperlipidemia: Secondary | ICD-10-CM | POA: Diagnosis not present

## 2023-10-18 DIAGNOSIS — I119 Hypertensive heart disease without heart failure: Secondary | ICD-10-CM | POA: Diagnosis not present

## 2023-10-18 DIAGNOSIS — I2510 Atherosclerotic heart disease of native coronary artery without angina pectoris: Secondary | ICD-10-CM | POA: Diagnosis not present

## 2023-10-29 ENCOUNTER — Ambulatory Visit (INDEPENDENT_AMBULATORY_CARE_PROVIDER_SITE_OTHER): Admitting: Physician Assistant

## 2023-10-29 ENCOUNTER — Encounter (INDEPENDENT_AMBULATORY_CARE_PROVIDER_SITE_OTHER): Payer: Self-pay | Admitting: Physician Assistant

## 2023-10-29 ENCOUNTER — Ambulatory Visit (INDEPENDENT_AMBULATORY_CARE_PROVIDER_SITE_OTHER): Admitting: Audiology

## 2023-10-29 VITALS — BP 119/79 | HR 62

## 2023-10-29 DIAGNOSIS — H918X3 Other specified hearing loss, bilateral: Secondary | ICD-10-CM

## 2023-10-29 DIAGNOSIS — H903 Sensorineural hearing loss, bilateral: Secondary | ICD-10-CM | POA: Diagnosis not present

## 2023-10-29 NOTE — Patient Instructions (Addendum)
 I have ordered an imaging study for you to complete prior to your next visit. Please call [facility] Scheduling at [phone] to schedule your imaging if you have not received a call within 24 hours. If you are unable to complete your imaging study prior to your next scheduled visit please call our office to let us  know.    To help with your buildup of earwax try using sweet oil. This can be purchased over the counter at your local pharmacy. Using a sterilized ear dropper, place a few drops into your ear. Cover the ear with a cotton ball, or warm compress, for 5 to 10 minutes. Rub gently. Wipe out any excess ear wax, and the oil, with a cotton ball or a wet cloth.

## 2023-10-29 NOTE — Progress Notes (Signed)
 Dear Dr. Vernon, Here is my assessment for our mutual patient, Charles Oconnor. Thank you for allowing me the opportunity to care for your patient. Please do not hesitate to contact me should you have any other questions. Sincerely, Chyrl Cohen PA-C  Otolaryngology Clinic Note Referring provider: Dr. Vernon HPI:  Charles Oconnor is a 72 y.o. male kindly referred by Dr. Vernon   The patient is a 73 year old gentleman seen in our office for evaluation of tinnitus.  The patient notes approxi-2 months ago after using a leaf blower he started to have ringing in his left ear.  He notes it is a high-pitched consistent tonal sound, he describes it as static like.  No pulsatile characteristics.  He denies any associated pain, he notes some unsteady gait but no vertiginous symptoms.  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  He denies any facial numbness or weakness.  He denies any new medications or exposures.  He denies any aggravating or alleviating modalities.  Today he reports his hearing feels normal.  No head or neck surgery.    Independent Review of Additional Tests or Records:  Audiological evaluation 10/29/2023   Otoscopy: Right ear: Clear external ear canal and notable landmarks visualized on the tympanic membrane. Left ear:  Clear external ear canal and notable landmarks visualized on the tympanic membrane.   Tympanometry: Right ear: Type A- Normal external ear canal volume with normal middle ear pressure and tympanic membrane compliance. Left ear: Type A- Normal external ear canal volume with normal middle ear pressure and tympanic membrane compliance.     Pure tone Audiometry: Right ear- normal hearing from [phone] Hz, then mild to moderate  sensorineural hearing loss notch from 3000 Hz - 8000 Hz. Left ear-  Normal hearing from [phone] Hz, then moderately severe to profound sensorineural hearing loss from  3000 Hz - 8000 Hz.   Speech Audiometry: Right ear- Speech Reception Threshold (SRT) was obtained at 15 dBHL. Left ear-Speech Reception Threshold (SRT) was obtained at 15 dBHL.   Word Recognition Score Tested using NU-6 (recorded) Right ear: 100% was obtained at a presentation level of 70 dBHL with contralateral masking which is deemed as  excellent. Left ear: 96% was obtained at a presentation level of 70 dBHL with contralateral masking which is deemed as  excellent.   The hearing test results were completed under headphones and re-checked with inserts and results are deemed to be of good reliability. Test technique:  conventional     Impression: There is a significant difference in pure-tone thresholds between ears.    PMH/Meds/All/SocHx/FamHx/ROS:   Past Medical History:  Diagnosis Date   Arthritis    Back knees   Cervical spinal stenosis    Complication of anesthesia    Coronary artery disease    mild to moderate   Dyslipidemia    Dyspnea    ED (erectile dysfunction)    GERD (gastroesophageal reflux disease)    Hypertension    PONV (postoperative nausea and vomiting)    Happened once 30 yrs ago. No problems since.   Prostate cancer [facility])      Past Surgical History:  Procedure Laterality Date   ANTERIOR CERVICAL DECOMPRESSION/DISCECTOMY FUSION 4 LEVELS N/A 09/22/2017   Procedure: ACDF - C3-C4 - C4-C5 - C5-C6 - C6-C7;  Surgeon: Louis Shove, MD;  Location: [facility] OR;  Service: Neurosurgery;  Laterality: N/A;   APPENDECTOMY  1985  CARDIAC CATHETERIZATION  2002, 2011   mild-mod CAD   CARDIAC CATHETERIZATION N/A 03/12/2015   Procedure: Left Heart Cath and Coronary Angiography;  Surgeon: Vinie JAYSON Maxcy, MD;  Location[facility] INVASIVE CV LAB;  Service: Cardiovascular;  Laterality: N/A;   COLON RESECTION N/A 02/22/2019   Procedure: LAPAROSCOPIC COLON RESECTION AND DIVERTICULECTOMY;  Surgeon: Stevie, Herlene Righter, MD;  Location: [facility] ORS;  Service: General;  Laterality: N/A;   HAND  SURGERY Left    had joints fused   JOINT REPLACEMENT Right 04/11/2009   LAPAROSCOPIC LYSIS OF ADHESIONS N/A 02/22/2019   Procedure: LAPAROSCOPIC LYSIS OF ADHESIONS;  Surgeon: Stevie Herlene Righter, MD;  Location: [facility] ORS;  Service: General;  Laterality: N/A;   PROSTATECTOMY  03/2007   RIGHT/LEFT HEART CATH AND CORONARY ANGIOGRAPHY N/A 09/27/2019   Procedure: RIGHT/LEFT HEART CATH AND CORONARY ANGIOGRAPHY;  Surgeon: Cherrie Toribio SAUNDERS, MD;  Location: [facility] INVASIVE CV LAB;  Service: Cardiovascular;  Laterality: N/A;   TOTAL KNEE ARTHROPLASTY  04/2009   TOTAL KNEE ARTHROPLASTY Left 12/19/2019   Procedure: TOTAL KNEE ARTHROPLASTY;  Surgeon: Melodi Lerner, MD;  Location: [facility] ORS;  Service: Orthopedics;  Laterality: Left;  50 mins    Family History  Problem Relation Age of Onset   Heart attack Father    Diabetes Mother    Heart failure Mother    Coronary artery disease Mother    Stroke Mother    Prostate cancer Brother    Heart attack Brother        also CVA   Heart attack Brother        also CVA   Cancer Brother      Social Connections: Not on file      Current Outpatient Medications:    gabapentin  (NEURONTIN ) 300 MG capsule, Take 300 mg by mouth 2 (two) times daily., Disp: , Rfl:    isosorbide  mononitrate (IMDUR ) 30 MG 24 hr tablet, TAKE 1 TABLET EVERY DAY, Disp: 90 tablet, Rfl: 3   lisinopril -hydrochlorothiazide  (ZESTORETIC ) 20-12.5 MG tablet, Take 2 tablets by mouth daily., Disp: 180 tablet, Rfl: 0   pantoprazole  (PROTONIX ) 40 MG tablet, Take 40 mg by mouth daily. , Disp: , Rfl:    rosuvastatin  (CRESTOR ) 40 MG tablet, TAKE 1 TABLET EVERY DAY, Disp: 90 tablet, Rfl: 3   amLODipine  (NORVASC ) 2.5 MG tablet, Take 1 tablet (2.5 mg total) by mouth daily., Disp: 90 tablet, Rfl: 3   Physical Exam:   BP 119/79   Pulse 62   SpO2 94%   Pertinent Findings  CN II-XII intact Bilateral EAC clear and TM intact with well pneumatized middle ear spaces, 2 mm white papule noted in the left  anterior inferior external auditory canal Weber 512: equal Rinne 512: AC > BC b/l  Anterior rhinoscopy: Septum midline; bilateral inferior turbinates with normal hypertrophy No lesions of oral cavity/oropharynx; dentition within normal limits No obviously palpable neck masses/lymphadenopathy/thyromegaly No respiratory distress or stridor  Seprately Identifiable Procedures:  Non  Impression & Plans:  Charles Oconnor is a 73 y.o. male with the following   Asymmetric hearing loss-  This is a 73 year old gentleman seen in our office for evaluation of tinnitus.  He has tonal tinnitus, no alarming features.  His audiogram does show asymmetric sensorineural hearing loss with the left greater than right.  Given the findings today I do recommend MRI IAC for further evaluation and management.  Once the imaging is complete I like to see him back in the office 2 weeks after that.  He  will return immediately if develops any new or worsening signs or symptoms.     - f/u 2 weeks after MRI C   Thank you for allowing me the opportunity to care for your patient. Please do not hesitate to contact me should you have any other questions.  Sincerely, Chyrl Cohen PA-C [facility] Phone: [phone] Fax: [phone]  10/29/2023, 9:13 AM

## 2023-10-29 NOTE — Progress Notes (Signed)
  [address] [place], [place] [address] [phone]  Audiological Evaluation    Name: Charles Oconnor     DOB:   Feb 06, 1951      MRN:   [id number]                                                                                     Service Date: 10/29/2023     Accompanied by: wife   Patient comes today after Lorane Rocker, PA-C sent a referral for a hearing evaluation due to concerns with tinnitus.   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 Left ear:  Clear external ear canal and notable landmarks visualized on the tympanic membrane.  Tympanometry: Right ear: Type A- Normal external ear canal volume with normal middle ear pressure and tympanic membrane compliance. Left ear: Type A- Normal external ear canal volume with normal middle ear pressure and tympanic membrane compliance.    Pure tone Audiometry: Right ear- normal hearing from [phone] Hz, then mild to moderate  sensorineural hearing loss notch from 3000 Hz - 8000 Hz. Left ear-  Normal hearing from [phone] Hz, then moderately severe to profound sensorineural hearing loss from 3000 Hz - 8000 Hz.  Speech Audiometry: Right ear- Speech Reception Threshold (SRT) was obtained at 15 dBHL. Left ear-Speech Reception Threshold (SRT) was obtained at 15 dBHL.   Word Recognition Score Tested using NU-6 (recorded) Right ear: 100% was obtained at a presentation level of 70 dBHL with contralateral masking which is deemed as  excellent. Left ear:  96% was obtained at a presentation level of 70 dBHL with contralateral masking which is deemed as  excellent.   The hearing test results were completed under headphones and re-checked with inserts and results are deemed to be of good reliability. Test technique:  conventional    Impression: There is a significant difference in pure-tone thresholds between ears.   Recommendations: Follow up with ENT as scheduled for today. Return for a hearing evaluation if concerns with hearing changes arise or per MD recommendation.Use hearing protection when exposed to loud/damaging sounds. Consider various tinnitus strategies, including the use of a sound generator, hearing aids, and/or tinnitus retraining therapy. Consider a communication needs assessment after medical clearance for hearing aids is obtained.   Charles Oconnor, AUD

## 2023-11-02 ENCOUNTER — Telehealth (INDEPENDENT_AMBULATORY_CARE_PROVIDER_SITE_OTHER): Payer: Self-pay | Admitting: Physician Assistant

## 2023-11-02 NOTE — Telephone Encounter (Signed)
 Left vm to schedule patient a 2 week follow up with Chyrl Cohen after MRI date

## 2023-11-06 ENCOUNTER — Ambulatory Visit (HOSPITAL_COMMUNITY)
Admission: RE | Admit: 2023-11-06 | Discharge: 2023-11-06 | Disposition: A | Discharge status: Home / Self Care | Source: Ambulatory Visit | Attending: Physician Assistant | Admitting: Physician Assistant

## 2023-11-06 DIAGNOSIS — G319 Degenerative disease of nervous system, unspecified: Secondary | ICD-10-CM | POA: Diagnosis not present

## 2023-11-06 DIAGNOSIS — H9190 Unspecified hearing loss, unspecified ear: Secondary | ICD-10-CM | POA: Diagnosis not present

## 2023-11-06 DIAGNOSIS — H918X3 Other specified hearing loss, bilateral: Secondary | ICD-10-CM | POA: Diagnosis not present

## 2023-11-06 DIAGNOSIS — J341 Cyst and mucocele of nose and nasal sinus: Secondary | ICD-10-CM | POA: Diagnosis not present

## 2023-11-06 DIAGNOSIS — I6782 Cerebral ischemia: Secondary | ICD-10-CM | POA: Diagnosis not present

## 2023-11-06 MED ORDER — GADOBUTROL 1 MMOL/ML IV SOLN
9.0000 mL | Freq: Once | INTRAVENOUS | Status: AC | PRN
  Administered 2023-11-06: 9 mL via INTRAVENOUS

## 2023-11-09 DIAGNOSIS — Z8546 Personal history of malignant neoplasm of prostate: Secondary | ICD-10-CM | POA: Diagnosis not present

## 2023-11-09 DIAGNOSIS — E782 Mixed hyperlipidemia: Secondary | ICD-10-CM | POA: Diagnosis not present

## 2023-11-09 DIAGNOSIS — I119 Hypertensive heart disease without heart failure: Secondary | ICD-10-CM | POA: Diagnosis not present

## 2023-11-09 DIAGNOSIS — I2510 Atherosclerotic heart disease of native coronary artery without angina pectoris: Secondary | ICD-10-CM | POA: Diagnosis not present

## 2023-11-11 ENCOUNTER — Encounter: Payer: Self-pay | Admitting: Audiology

## 2023-11-11 DIAGNOSIS — S92354A Nondisplaced fracture of fifth metatarsal bone, right foot, initial encounter for closed fracture: Secondary | ICD-10-CM | POA: Diagnosis not present

## 2023-11-17 DIAGNOSIS — I2510 Atherosclerotic heart disease of native coronary artery without angina pectoris: Secondary | ICD-10-CM | POA: Diagnosis not present

## 2023-11-17 DIAGNOSIS — I119 Hypertensive heart disease without heart failure: Secondary | ICD-10-CM | POA: Diagnosis not present

## 2023-11-20 ENCOUNTER — Ambulatory Visit (INDEPENDENT_AMBULATORY_CARE_PROVIDER_SITE_OTHER): Admitting: Physician Assistant

## 2023-11-20 ENCOUNTER — Encounter (INDEPENDENT_AMBULATORY_CARE_PROVIDER_SITE_OTHER): Payer: Self-pay | Admitting: Physician Assistant

## 2023-11-20 VITALS — BP 126/77 | HR 57

## 2023-11-20 DIAGNOSIS — H9312 Tinnitus, left ear: Secondary | ICD-10-CM

## 2023-11-20 DIAGNOSIS — H9190 Unspecified hearing loss, unspecified ear: Secondary | ICD-10-CM | POA: Diagnosis not present

## 2023-11-20 DIAGNOSIS — H9319 Tinnitus, unspecified ear: Secondary | ICD-10-CM | POA: Diagnosis not present

## 2023-11-20 DIAGNOSIS — H903 Sensorineural hearing loss, bilateral: Secondary | ICD-10-CM

## 2023-11-20 NOTE — Progress Notes (Signed)
 Dear Dr. Loreli, Here is my assessment for our mutual patient, Carlton Buskey. Thank you for allowing me the opportunity to care for your patient. Please do not hesitate to contact me should you have any other questions. Sincerely, Chyrl Cohen PA-C  Otolaryngology Clinic Note Referring provider: Dr. Loreli HPI:  Charles Oconnor is a 73 y.o. male kindly referred by Dr. Loreli   The patient is a 73 year old gentleman seen in our office for follow-up evaluation and MRI review.  The patient was last seen in the office on 10/29/2023.  Below is a recap of the encounter.  The patient is a 73 year old gentleman seen in our office for evaluation of tinnitus.  The patient notes approxi-2 months ago after using a leaf blower he started to have ringing in his left ear.  He notes it is a high-pitched consistent tonal sound, he describes it as static like.  No pulsatile characteristics.  He denies any associated pain, he notes some unsteady gait but no vertiginous symptoms.  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  He denies any facial numbness or weakness.  He denies any new medications or exposures.  He denies any aggravating or alleviating modalities.  Today he reports his hearing feels normal.  Cervical fusion May 2019.     Update 11/20/2023  Since last office visit he denies any significant changes.  He notes continued tonal tinnitus in the left ear, that does fluctuate.  He denies any associated pain.  Did have MRI completed     Independent Review of Additional Tests or Records:  MRI IAC 11/06/2023   EXAM: MRI HEAD WITHOUT AND WITH CONTRAST   TECHNIQUE: Multiplanar, multiecho pulse sequences of the brain and surrounding structures were obtained without and with intravenous contrast.   CONTRAST:  9mL GADAVIST  GADOBUTROL  1 MMOL/ML IV SOLN   COMPARISON:  None.   FINDINGS: Brain:   Mild-to-moderate generalized cerebral  atrophy.   Multifocal T2 FLAIR hyperintense signal abnormality within the cerebral white matter, nonspecific but compatible with mild chronic small vessel ischemic disease.   No evidence of an intracranial mass. Specifically, no cerebellopontine angle or internal auditory canal mass is demonstrated. Unremarkable appearance of the 7th and 8th cranial nerves bilaterally.   There is no acute infarct.   No chronic intracranial blood products.   No extra-axial fluid collection.   No midline shift.   No pathologic intracranial enhancement identified.   Vascular: Maintained flow voids within the proximal large arterial vessels.   Skull and upper cervical spine: No focal worrisome marrow lesion. Incompletely assessed cervical spondylosis. Susceptibility artifact at the level of the cervical spine presumably arising from ACDF hardware.   Sinuses/Orbits: No mass or acute finding within the imaged orbits. Small left maxillary sinus mucous retention cyst.   IMPRESSION: 1. No cerebellopontine angle or internal auditory canal mass. 2. No etiology of hearing loss is identified. 3. Mild chronic small vessel ischemic changes within the cerebral white matter. 4. Mild-to-moderate generalized cerebral atrophy. 5. Small left maxillary sinus mucous retention cyst.    PMH/Meds/All/SocHx/FamHx/ROS:   Past Medical History:  Diagnosis Date   Arthritis    Back knees   Cervical spinal stenosis    Complication of anesthesia    Coronary artery disease    mild to moderate   Dyslipidemia    Dyspnea    ED (erectile dysfunction)    GERD (gastroesophageal reflux disease)  Hypertension    PONV (postoperative nausea and vomiting)    Happened once 30 yrs ago. No problems since.   Prostate cancer [facility])      Past Surgical History:  Procedure Laterality Date   ANTERIOR CERVICAL DECOMPRESSION/DISCECTOMY FUSION 4 LEVELS N/A 09/22/2017   Procedure: ACDF - C3-C4 - C4-C5 - C5-C6 - C6-C7;  Surgeon:  Louis Shove, MD;  Location: [facility] OR;  Service: Neurosurgery;  Laterality: N/A;   APPENDECTOMY  1985   CARDIAC CATHETERIZATION  2002, 2011   mild-mod CAD   CARDIAC CATHETERIZATION N/A 03/12/2015   Procedure: Left Heart Cath and Coronary Angiography;  Surgeon: Vinie JAYSON Maxcy, MD;  Location: [facility] INVASIVE CV LAB;  Service: Cardiovascular;  Laterality: N/A;   COLON RESECTION N/A 02/22/2019   Procedure: LAPAROSCOPIC COLON RESECTION AND DIVERTICULECTOMY;  Surgeon: Stevie, Herlene Righter, MD;  Location: [facility] ORS;  Service: General;  Laterality: N/A;   HAND SURGERY Left    had joints fused   JOINT REPLACEMENT Right 04/11/2009   LAPAROSCOPIC LYSIS OF ADHESIONS N/A 02/22/2019   Procedure: LAPAROSCOPIC LYSIS OF ADHESIONS;  Surgeon: Stevie Herlene Righter, MD;  Location: [facility] ORS;  Service: General;  Laterality: N/A;   PROSTATECTOMY  03/2007   RIGHT/LEFT HEART CATH AND CORONARY ANGIOGRAPHY N/A 09/27/2019   Procedure: RIGHT/LEFT HEART CATH AND CORONARY ANGIOGRAPHY;  Surgeon: Cherrie Toribio SAUNDERS, MD;  Location: [facility] INVASIVE CV LAB;  Service: Cardiovascular;  Laterality: N/A;   TOTAL KNEE ARTHROPLASTY  04/2009   TOTAL KNEE ARTHROPLASTY Left 12/19/2019   Procedure: TOTAL KNEE ARTHROPLASTY;  Surgeon: Melodi Lerner, MD;  Location: [facility] ORS;  Service: Orthopedics;  Laterality: Left;  50 mins    Family History  Problem Relation Age of Onset   Heart attack Father    Diabetes Mother    Heart failure Mother    Coronary artery disease Mother    Stroke Mother    Prostate cancer Brother    Heart attack Brother        also CVA   Heart attack Brother        also CVA   Cancer Brother      Social Connections: Not on file      Current Outpatient Medications:    amLODipine  (NORVASC ) 2.5 MG tablet, Take 1 tablet (2.5 mg total) by mouth daily., Disp: 90 tablet, Rfl: 3   gabapentin  (NEURONTIN ) 300 MG capsule, Take 300 mg by mouth 2 (two) times daily., Disp: , Rfl:    isosorbide  mononitrate (IMDUR ) 30 MG 24 hr tablet, TAKE  1 TABLET EVERY DAY, Disp: 90 tablet, Rfl: 3   lisinopril -hydrochlorothiazide  (ZESTORETIC ) 20-12.5 MG tablet, Take 2 tablets by mouth daily., Disp: 180 tablet, Rfl: 0   pantoprazole  (PROTONIX ) 40 MG tablet, Take 40 mg by mouth daily. , Disp: , Rfl:    rosuvastatin  (CRESTOR ) 40 MG tablet, TAKE 1 TABLET EVERY DAY, Disp: 90 tablet, Rfl: 3   Physical Exam:   BP 126/77   Pulse (!) 57   SpO2 94%   Pertinent Findings  CN II-XII intact Bilateral EAC clear and TM intact with well pneumatized middle ear spaces No palpable neck masses/lymphadenopathy/thyromegaly No respiratory distress or stridor  Seprately Identifiable Procedures:  None  Impression & Plans:  Charles Oconnor is a 73 y.o. male with the following   Asymmetric hearing loss-  73 year old gentleman seen in our office for evaluation and MRI review.  The patient does have asymmetric hearing loss with associated tonal tinnitus.  No red flags, MRI reassuring with no obvious abnormality  related to his hearing.  At this point the patient may use masking devices, he I advised him to follow-up immediately if he develops any new or worsening signs or symptoms.   - f/u PRN   Thank you for allowing me the opportunity to care for your patient. Please do not hesitate to contact me should you have any other questions.  Sincerely, Chyrl Cohen PA-C [facility] Phone: [phone] Fax: [phone]  11/20/2023, 10:43 AM

## 2023-12-04 DIAGNOSIS — S92354D Nondisplaced fracture of fifth metatarsal bone, right foot, subsequent encounter for fracture with routine healing: Secondary | ICD-10-CM | POA: Diagnosis not present

## 2023-12-04 DIAGNOSIS — M25571 Pain in right ankle and joints of right foot: Secondary | ICD-10-CM | POA: Diagnosis not present

## 2023-12-04 DIAGNOSIS — M79671 Pain in right foot: Secondary | ICD-10-CM | POA: Diagnosis not present

## 2023-12-10 DIAGNOSIS — Z8546 Personal history of malignant neoplasm of prostate: Secondary | ICD-10-CM | POA: Diagnosis not present

## 2023-12-10 DIAGNOSIS — E782 Mixed hyperlipidemia: Secondary | ICD-10-CM | POA: Diagnosis not present

## 2023-12-10 DIAGNOSIS — I2510 Atherosclerotic heart disease of native coronary artery without angina pectoris: Secondary | ICD-10-CM | POA: Diagnosis not present

## 2023-12-10 DIAGNOSIS — I119 Hypertensive heart disease without heart failure: Secondary | ICD-10-CM | POA: Diagnosis not present

## 2023-12-17 DIAGNOSIS — I2510 Atherosclerotic heart disease of native coronary artery without angina pectoris: Secondary | ICD-10-CM | POA: Diagnosis not present

## 2023-12-17 DIAGNOSIS — I119 Hypertensive heart disease without heart failure: Secondary | ICD-10-CM | POA: Diagnosis not present

## 2024-01-01 DIAGNOSIS — S92354D Nondisplaced fracture of fifth metatarsal bone, right foot, subsequent encounter for fracture with routine healing: Secondary | ICD-10-CM | POA: Diagnosis not present

## 2024-01-08 DIAGNOSIS — H9319 Tinnitus, unspecified ear: Secondary | ICD-10-CM | POA: Diagnosis not present

## 2024-01-08 DIAGNOSIS — R7303 Prediabetes: Secondary | ICD-10-CM | POA: Diagnosis not present

## 2024-01-08 DIAGNOSIS — I2510 Atherosclerotic heart disease of native coronary artery without angina pectoris: Secondary | ICD-10-CM | POA: Diagnosis not present

## 2024-01-08 DIAGNOSIS — I119 Hypertensive heart disease without heart failure: Secondary | ICD-10-CM | POA: Diagnosis not present

## 2024-01-08 DIAGNOSIS — E782 Mixed hyperlipidemia: Secondary | ICD-10-CM | POA: Diagnosis not present

## 2024-01-08 DIAGNOSIS — M549 Dorsalgia, unspecified: Secondary | ICD-10-CM | POA: Diagnosis not present

## 2024-01-10 DIAGNOSIS — I2510 Atherosclerotic heart disease of native coronary artery without angina pectoris: Secondary | ICD-10-CM | POA: Diagnosis not present

## 2024-01-10 DIAGNOSIS — I119 Hypertensive heart disease without heart failure: Secondary | ICD-10-CM | POA: Diagnosis not present

## 2024-01-10 DIAGNOSIS — E782 Mixed hyperlipidemia: Secondary | ICD-10-CM | POA: Diagnosis not present

## 2024-01-10 DIAGNOSIS — Z8546 Personal history of malignant neoplasm of prostate: Secondary | ICD-10-CM | POA: Diagnosis not present

## 2024-02-02 DIAGNOSIS — M79671 Pain in right foot: Secondary | ICD-10-CM | POA: Diagnosis not present

## 2024-02-02 DIAGNOSIS — S92354D Nondisplaced fracture of fifth metatarsal bone, right foot, subsequent encounter for fracture with routine healing: Secondary | ICD-10-CM | POA: Diagnosis not present

## 2024-02-12 DIAGNOSIS — Z23 Encounter for immunization: Secondary | ICD-10-CM | POA: Diagnosis not present

## 2024-03-12 ENCOUNTER — Other Ambulatory Visit: Payer: Self-pay | Admitting: Internal Medicine

## 2024-03-15 DIAGNOSIS — S92354D Nondisplaced fracture of fifth metatarsal bone, right foot, subsequent encounter for fracture with routine healing: Secondary | ICD-10-CM | POA: Diagnosis not present

## 2024-03-21 ENCOUNTER — Other Ambulatory Visit: Payer: Self-pay | Admitting: Medical Genetics

## 2024-03-21 DIAGNOSIS — Z006 Encounter for examination for normal comparison and control in clinical research program: Secondary | ICD-10-CM

## 2024-04-19 LAB — GENECONNECT MOLECULAR SCREEN: Genetic Analysis Overall Interpretation: NEGATIVE

## 2024-05-04 ENCOUNTER — Other Ambulatory Visit: Payer: Self-pay | Admitting: Internal Medicine

## 2024-06-24 ENCOUNTER — Ambulatory Visit: Admitting: Internal Medicine
